# Patient Record
Sex: Male | Born: 1945
Health system: Southern US, Community
[De-identification: ages and names within clinical notes are randomized; demographics above are authoritative.]

## PROBLEM LIST (undated history)

## (undated) DIAGNOSIS — R911 Solitary pulmonary nodule: Secondary | ICD-10-CM

## (undated) DIAGNOSIS — M1990 Unspecified osteoarthritis, unspecified site: Secondary | ICD-10-CM

## (undated) DIAGNOSIS — Z973 Presence of spectacles and contact lenses: Secondary | ICD-10-CM

## (undated) DIAGNOSIS — Z972 Presence of dental prosthetic device (complete) (partial): Secondary | ICD-10-CM

## (undated) DIAGNOSIS — Z87442 Personal history of urinary calculi: Secondary | ICD-10-CM

## (undated) DIAGNOSIS — Z85850 Personal history of malignant neoplasm of thyroid: Secondary | ICD-10-CM

## (undated) DIAGNOSIS — Z889 Allergy status to unspecified drugs, medicaments and biological substances status: Secondary | ICD-10-CM

## (undated) DIAGNOSIS — N201 Calculus of ureter: Secondary | ICD-10-CM

## (undated) DIAGNOSIS — Z860100 Personal history of colon polyps, unspecified: Secondary | ICD-10-CM

## (undated) DIAGNOSIS — E119 Type 2 diabetes mellitus without complications: Secondary | ICD-10-CM

## (undated) DIAGNOSIS — I2510 Atherosclerotic heart disease of native coronary artery without angina pectoris: Secondary | ICD-10-CM

## (undated) DIAGNOSIS — E079 Disorder of thyroid, unspecified: Secondary | ICD-10-CM

## (undated) DIAGNOSIS — E785 Hyperlipidemia, unspecified: Secondary | ICD-10-CM

## (undated) DIAGNOSIS — I10 Essential (primary) hypertension: Secondary | ICD-10-CM

## (undated) DIAGNOSIS — E782 Mixed hyperlipidemia: Secondary | ICD-10-CM

## (undated) HISTORY — DX: Disorder of thyroid, unspecified: E07.9

## (undated) HISTORY — DX: Allergy status to unspecified drugs, medicaments and biological substances status: Z88.9

## (undated) HISTORY — DX: Personal history of colon polyps, unspecified: Z86.0100

## (undated) HISTORY — DX: Personal history of malignant neoplasm of thyroid: Z85.850

## (undated) HISTORY — DX: Personal history of colonic polyps: Z86.010

## (undated) HISTORY — PX: TONSILLECTOMY: SHX5217

## (undated) HISTORY — PX: TONSILLECTOMY: SUR1361

## (undated) HISTORY — DX: Type 2 diabetes mellitus without complications: E11.9

## (undated) HISTORY — DX: Hyperlipidemia, unspecified: E78.5

## (undated) HISTORY — DX: Essential (primary) hypertension: I10

---

## 1977-10-30 HISTORY — PX: KNEE ARTHROSCOPY AND ARTHROTOMY: SUR84

## 1977-10-30 HISTORY — PX: KNEE SURGERY: SHX244

## 1998-07-02 ENCOUNTER — Ambulatory Visit (HOSPITAL_COMMUNITY): Admission: RE | Admit: 1998-07-02 | Discharge: 1998-07-02 | Payer: Self-pay | Admitting: Family Medicine

## 1998-07-02 ENCOUNTER — Encounter: Payer: Self-pay | Admitting: Family Medicine

## 1998-07-03 ENCOUNTER — Other Ambulatory Visit: Admission: RE | Admit: 1998-07-03 | Discharge: 1998-07-03 | Payer: Self-pay | Admitting: General Surgery

## 1998-07-26 ENCOUNTER — Ambulatory Visit (HOSPITAL_COMMUNITY): Admission: RE | Admit: 1998-07-26 | Discharge: 1998-07-27 | Payer: Self-pay | Admitting: General Surgery

## 1998-09-10 DIAGNOSIS — C73 Malignant neoplasm of thyroid gland: Secondary | ICD-10-CM | POA: Insufficient documentation

## 1998-09-27 ENCOUNTER — Ambulatory Visit (HOSPITAL_COMMUNITY): Admission: RE | Admit: 1998-09-27 | Discharge: 1998-09-27 | Payer: Self-pay | Admitting: Internal Medicine

## 1998-09-30 ENCOUNTER — Encounter (HOSPITAL_BASED_OUTPATIENT_CLINIC_OR_DEPARTMENT_OTHER): Payer: Self-pay | Admitting: Internal Medicine

## 1998-10-05 ENCOUNTER — Ambulatory Visit (HOSPITAL_COMMUNITY): Admission: RE | Admit: 1998-10-05 | Discharge: 1998-10-05 | Payer: Self-pay | Admitting: Internal Medicine

## 1998-10-08 ENCOUNTER — Encounter (HOSPITAL_BASED_OUTPATIENT_CLINIC_OR_DEPARTMENT_OTHER): Payer: Self-pay | Admitting: Internal Medicine

## 1998-10-08 ENCOUNTER — Ambulatory Visit (HOSPITAL_COMMUNITY): Admission: RE | Admit: 1998-10-08 | Discharge: 1998-10-08 | Payer: Self-pay | Admitting: Internal Medicine

## 1998-10-30 DIAGNOSIS — Z9221 Personal history of antineoplastic chemotherapy: Secondary | ICD-10-CM

## 1998-10-30 DIAGNOSIS — E890 Postprocedural hypothyroidism: Secondary | ICD-10-CM

## 1998-10-30 DIAGNOSIS — Z85850 Personal history of malignant neoplasm of thyroid: Secondary | ICD-10-CM

## 1998-10-30 HISTORY — PX: TOTAL THYROIDECTOMY: SHX2547

## 1998-10-30 HISTORY — DX: Personal history of antineoplastic chemotherapy: Z92.21

## 1998-10-30 HISTORY — DX: Postprocedural hypothyroidism: E89.0

## 1998-10-30 HISTORY — DX: Personal history of malignant neoplasm of thyroid: Z85.850

## 1998-10-30 HISTORY — PX: THYROIDECTOMY: SHX17

## 2006-12-31 ENCOUNTER — Ambulatory Visit: Payer: Self-pay | Admitting: Cardiovascular Disease

## 2007-11-19 ENCOUNTER — Ambulatory Visit: Payer: Self-pay | Admitting: Gastroenterology

## 2007-11-26 ENCOUNTER — Encounter: Payer: Self-pay | Admitting: Gastroenterology

## 2007-11-26 ENCOUNTER — Ambulatory Visit: Payer: Self-pay | Admitting: Gastroenterology

## 2007-11-26 LAB — HM COLONOSCOPY

## 2010-07-15 ENCOUNTER — Encounter: Payer: Self-pay | Admitting: Cardiovascular Disease

## 2011-03-17 NOTE — Procedures (Signed)
[facility]                              EXERCISE TREADMILL   KHING, BELCHER                     MRN:          [id number]  DATE:12/31/2006                            DOB:          [birth year]    PROCEDURE:  Exercise stress test.   INDICATIONS FOR PROCEDURE:  [name] is a 65 year old gentleman with  orthostatic hypotension, dizziness and weakness. An exercise stress test  was performed to determine exercise tolerance and to screen for  obstructive coronary artery disease as an etiology of his symptoms.   FINDINGS:  [name] exercised following on the treadmill per the  modified Bruce protocol. He exercised into stage 4 for a total of 9  minutes and 24 seconds achieving a work load of 10.6 mets. His resting  ECG was normal with a resting heart rate of 78 beats per minute. His  heart rate rose to a maximum of 166 beats per minute which represented  102% of his maximal age predicted heart rate. The test was discontinued  because of achieving maximum age predicted heart rate. The blood  pressure rose from 132/87 at rest to a maximum of 178/71 mmHg.   [name] had no chest pain, arrhythmia or significant ST segment  changes with exercise.   CONCLUSION:  1. Good exercise tolerance.  2. No evidence of myocardial ischemia.  3. Normal blood pressure response to exercise.     Veverly Fells. Excell Seltzer, MD  Electronically Signed    MDC/MedQ  DD: 01/01/2007  DT: 01/02/2007  Job #: [id number]   cc:   Bradley Wong, M.D.

## 2011-03-17 NOTE — Assessment & Plan Note (Signed)
[facility]                            CARDIOLOGY OFFICE NOTE   Bradley Wong, Bradley Wong                     MRN:          [id number]  DATE:12/31/2006                            DOB:          08-Dec-1945    January 01, 2007   CHIEF COMPLAINT:  Dizziness.   HISTORY OF PRESENT ILLNESS:  Bradley Wong is a very nice 65 year old  gentleman who presents for evaluation of dizziness.  Bradley Wong describes two  distinct episodes of lightheadedness in the last two days.  The first  episode occurred two days prior to his office visit after a full day of  working outside.  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  That  morning, Bradley Wong ate a light breakfast and had some Gatorade at lunch.  Other  than that, Bradley Wong did not have much p.o. intake.  Bradley Wong had two cups of coffee  prior to his work as well.  Soon after Bradley Wong finished working, Bradley Wong developed  a lightheadedness and weakness.  Bradley Wong had to sit down as his symptoms  progressed.  After drinking another Gatorade his symptoms abated.  That  evening Bradley Wong felt fatigued as well.   The following day at church Bradley Wong became lightheaded again after standing  up.  Bradley Wong had been sitting for approximately 10 minutes and then, upon  standing, became lightheaded once again.  Bradley Wong did not have syncope and  his symptoms resolved after sitting and resting for a few more minutes.  Bradley Wong has had no further episodes of lightheadedness.  Bradley Wong denies syncope,  chest pain, palpitations, orthopnea, PND, edema or other cardiovascular  complaints.  Bradley Wong has no history of lightheaded episodes in the past.   Of note, Bradley Wong started taking Benicar approximately three weeks  ago for newly diagnosed hypertension.  Bradley Wong has no other recent medication  changes.   CURRENT MEDICATIONS:  1. Benicar 20 mg daily.  2. Aspirin 81 mg daily.  3. Crestor 5 mg daily.  4. Synthroid 137 mcg daily.   ALLERGIES:  PENICILLIN.   PAST MEDICAL HISTORY:  1. Thyroid cancer, status post  thyroidectomy in 2000.  2. Dyslipidemia.  3. Essential hypertension.  4. History of right knee surgery.  5. Seasonal allergies.   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  Bradley Wong is a nonsmoker  and drinks very rare alcohol.  Bradley Wong drinks approximately two cups of  coffee in the morning.  Bradley Wong is not engaged in formal exercise but does  walk regularly and is Bradley Wong is very active.   FAMILY HISTORY:  His mother has carotid artery disease but is now 63  years old and had no early vascular disease.  Father died at age 12 of  throat and bone cancer.  Bradley Wong had two brothers who are deceased from  complications of diabetes and end-stage renal disease.   REVIEW OF SYSTEMS:  There was a complete 12-point review of systems  performed.  The only pertinent positives were urinary hesitancy, sinus  congestion and seasonal allergies.  PHYSICAL EXAMINATION:  GENERAL APPEARANCE:  The patient is alert and  oriented.  Bradley Wong is in no acute distress.  VITAL SIGNS:  Orthostatic vital signs were supine heart rate 68, blood  pressure 156/100.  Sitting heart rate 82, blood pressure 131/90.  Standing heart rate at 2 minutes was 81 and blood pressure was 138/94.  Weight is 201 pounds.  HEENT:  Normal.  NECK:  Normal carotid upstrokes without bruits.  Jugular venous pressure  is normal.  There is no thyromegaly or nodules.  LUNGS:  Clear to auscultation bilaterally.  CARDIOVASCULAR:  The apex is discrete and nondisplaced.  There is no  right ventricular heave or lift.  Heart is regular rate and rhythm  without murmurs or gallops.  BACK:  There is no paraspinal tenderness.  There is no flank tenderness.  ABDOMEN:  Soft, nontender, no abdominal bruits, no organomegaly.  EXTREMITIES:  No clubbing, cyanosis, or edema.  Peripheral pulses are 2+  and equal throughout.  There are no femoral artery bruits.  SKIN:  Warm and dry without rash.   LYMPHATICS:  There is no adenopathy.  NEUROLOGIC:  Cranial nerves II-XII intact.  Strength is 5/5 and equal in  the arms and legs bilaterally.   EKG is normal sinus rhythm and is within normal limits.   Exercise treadmill stress study showed good exercise tolerance.  No  significant EKG changes.  No arrhythmias and no symptoms.  There was a  normal blood pressure response to exercise.   ASSESSMENT:  Bradley Wong is currently stable from a cardiovascular  standpoint.  His cardiac issues are as follows:  1. Symptomatic orthostatic hypotension.  This is clearly the main      problem that prompted today's visit.  I am uncertain of the cause      of Bradley Wong's new symptoms, although I think it is possible that      the combination of a relatively new antihypertensive medication as      well as mild volume depletion may have resulted in his symptoms      over the preceding two days.  I have advised Bradley Wong to try to avoid      caffeine and to push fluids.  If Bradley Wong has further problems I think it      would be reasonable to alter his antihypertensive therapy, but for      now I have recommended continuing on Benicar.  If Bradley Wong has further      problems, we could consider a beta-blocker or another class of      agent.  I think his normal exercise study is reassuring that there      is no significant organic problem.  2. Dyslipidemia.  Bradley Wong remains under the care of Dr. Christell Constant and is      treated with Crestor.  3. Cardiovascular risk.  Overall Bradley Wong's cardiovascular risk      factors are age, hypertension and dyslipidemia.  I think with      control of his risk factors with medical therapy, Bradley Wong should be      expected to do very well for the long term.   FOLLOW UP:  I plan on seeing Bradley Wong back in the clinic in approximately two  weeks or sooner if any new problems arise.  At that time, will recheck a  blood pressure and see if any medication adjustments are needed.    Veverly Fells. Excell Seltzer, MD   Electronically Signed    MDC/MedQ  DD: 01/01/2007  DT: 01/02/2007  Job #: [id number]   cc:   Ernestina Penna, M.D.

## 2012-06-24 ENCOUNTER — Telehealth: Payer: Self-pay | Admitting: Cardiovascular Disease

## 2012-06-24 DIAGNOSIS — I10 Essential (primary) hypertension: Secondary | ICD-10-CM

## 2012-06-24 NOTE — Telephone Encounter (Signed)
Chart reviewed. Would be reasonable to repeat an exercise treadmill study (POET) considering his symptoms. Please put on my schedule.

## 2012-06-24 NOTE — Telephone Encounter (Signed)
Pt had stress test done 2008, wants to know when he needs another one, pls call

## 2012-06-24 NOTE — Telephone Encounter (Signed)
Pt was evaluated by Dr Christell Constant PCP today for physical. Dr Christell Constant asked the pt to contact our office to see if he needs another stress test performed.  Last stress test in 2008.  Pt denies CP.  Pt does have SOB and fatigue. I will forward this message to Dr Excell Seltzer for review.

## 2012-07-03 NOTE — Telephone Encounter (Signed)
Pt scheduled for GXT on 07/31/12. Pt aware.

## 2012-07-30 ENCOUNTER — Encounter: Payer: Self-pay | Admitting: *Deleted

## 2012-07-31 ENCOUNTER — Encounter: Payer: Self-pay | Admitting: Cardiovascular Disease

## 2012-07-31 ENCOUNTER — Ambulatory Visit (INDEPENDENT_AMBULATORY_CARE_PROVIDER_SITE_OTHER): Payer: BC Managed Care – PPO | Admitting: Cardiovascular Disease

## 2012-07-31 DIAGNOSIS — R9439 Abnormal result of other cardiovascular function study: Secondary | ICD-10-CM

## 2012-07-31 DIAGNOSIS — I10 Essential (primary) hypertension: Secondary | ICD-10-CM

## 2012-07-31 NOTE — Addendum Note (Signed)
Addended by: Iona Coach on: 07/31/2012 05:10 PM   Modules accepted: Orders

## 2012-07-31 NOTE — Procedures (Signed)
Exercise Treadmill Test  Pre-Exercise Testing Evaluation Rhythm: normal sinus  Rate: 70   QT:  .39 QTc: .42     Test  Exercise Tolerance Test Ordering MD: Tonny Bollman, MD  Interpreting MD: Tonny Bollman , MD  Unique Test No: 1  Treadmill:  1  Indication for ETT: HTN  Contraindication to ETT: No   Stress Modality: exercise - treadmill  Cardiac Imaging Performed: non   Protocol: standard Bruce - maximal  Max BP:  152/70  Max MPHR (bpm):  157 85% MPR (bpm):  133  MPHR obtained (bpm):  162 % MPHR obtained:  102%  Reached 85% MPHR (min:sec):  5:00 Total Exercise Time (min-sec):  8:07  Workload in METS:  9.9 Borg Scale: 15  Reason ETT Terminated:  dyspnea    ST Segment Analysis At Rest: normal ST segments - no evidence of significant ST depression With Exercise: significant ischemic ST depression  Other Information Arrhythmia:  Yes Angina during ETT:  absent (0) Quality of ETT:  diagnostic  ETT Interpretation:  abnormal - evidence of ST depression consistent with ischemia  Comments: Abnormal but low-risk stress test with positive ST depression, but upsloping ST segments and rapid normalization in recovery. Few PVC's and rare ventricular couplet with exertion. No angina.  Recommendations: Exercise Myoview stress test.

## 2012-07-31 NOTE — Addendum Note (Signed)
Addended by: Iona Coach on: 07/31/2012 05:02 PM   Modules accepted: Orders

## 2012-08-05 ENCOUNTER — Ambulatory Visit (HOSPITAL_COMMUNITY): Payer: BC Managed Care – PPO | Attending: Cardiology | Admitting: Radiology

## 2012-08-05 VITALS — BP 130/83 | Ht 72.0 in | Wt 212.0 lb

## 2012-08-05 DIAGNOSIS — I10 Essential (primary) hypertension: Secondary | ICD-10-CM

## 2012-08-05 DIAGNOSIS — R5381 Other malaise: Secondary | ICD-10-CM | POA: Insufficient documentation

## 2012-08-05 DIAGNOSIS — R002 Palpitations: Secondary | ICD-10-CM | POA: Insufficient documentation

## 2012-08-05 DIAGNOSIS — R9439 Abnormal result of other cardiovascular function study: Secondary | ICD-10-CM

## 2012-08-05 DIAGNOSIS — R0602 Shortness of breath: Secondary | ICD-10-CM | POA: Insufficient documentation

## 2012-08-05 DIAGNOSIS — I4949 Other premature depolarization: Secondary | ICD-10-CM

## 2012-08-05 MED ORDER — TECHNETIUM TC 99M SESTAMIBI GENERIC - CARDIOLITE
30.0000 | Freq: Once | INTRAVENOUS | Status: AC | PRN
  Administered 2012-08-05: 30 via INTRAVENOUS

## 2012-08-05 MED ORDER — TECHNETIUM TC 99M SESTAMIBI GENERIC - CARDIOLITE
10.0000 | Freq: Once | INTRAVENOUS | Status: AC | PRN
  Administered 2012-08-05: 10 via INTRAVENOUS

## 2012-08-05 NOTE — Progress Notes (Signed)
[facility] SITE 3 NUCLEAR MED [id number] [place] [address] [phone]  Cardiology Nuclear Med Study  Bradley Wong is a 66 y.o. male     MRN : [id number]     DOB: 1946-02-08  Procedure Date: 08/05/2012  Nuclear Med Background Indication for Stress Test:  Evaluation for Ischemia and Abnormal EKG History:  07/31/12 GXT: ST depression PVCS with exercise Cardiac Risk Factors: NA  Symptoms:  Fatigue, Palpitations and SOB   Nuclear Pre-Procedure Caffeine/Decaff Intake:  None NPO After: 7:00am   Lungs:  clear O2 Sat: 98% on room air. IV 0.9% NS with Angio Cath:  20g  IV Site: L Antecubital  IV Started by:  Stanton Kidney, EMT-P  Chest Size (in):  42 Cup Size: n/a  Height: 6' (1.829 m)  Weight:  212 lb (96.163 kg)  BMI:  Body mass index is 28.75 kg/(m^2). Tech Comments:  NA    Nuclear Med Study 1 or 2 day study: 1 day  Stress Test Type:  Stress  Reading MD: Willa Rough, MD  Order Authorizing Provider:  M.Cooper MD  Resting Radionuclide: Technetium 23m Sestamibi  Resting Radionuclide Dose: 11.0 mCi   Stress Radionuclide:  Technetium 53m Sestamibi  Stress Radionuclide Dose: 33.0 mCi           Stress Protocol Rest HR: 63 Stress HR: 155  Rest BP: 130/83 Stress BP: 167/70  Exercise Time (min): 7:30 METS: 9.20   Predicted Max HR: 157 bpm % Max HR: 98.73 bpm Rate Pressure Product: 62952   Dose of Adenosine (mg):  n/a Dose of Lexiscan: n/a mg  Dose of Atropine (mg): n/a Dose of Dobutamine: n/a mcg/kg/min (at max HR)  Stress Test Technologist: Milana Na, EMT-P  Nuclear Technologist:  Domenic Polite, CNMT     Rest Procedure:  Myocardial perfusion imaging was performed at rest 45 minutes following the intravenous administration of Technetium 27m Sestamibi. Rest ECG: NSR - Normal EKG  Stress Procedure:  The patient performed treadmill exercise using a Bruce  Protocol for 7:30 minutes. The patient stopped due to sob, fatigue, and  denied any chest pain.  There were no significant ST-T wave changes and occ pvcs/pacs.  Technetium 27m Sestamibi was injected at peak exercise and myocardial perfusion imaging was performed after a brief delay. Stress ECG: No significant change from baseline ECG  QPS Raw Data Images:  Patient motion noted; appropriate software correction applied. Stress Images:  Normal homogeneous uptake in all areas of the myocardium. Rest Images:  Normal homogeneous uptake in all areas of the myocardium. Subtraction (SDS):  No evidence of ischemia. Transient Ischemic Dilatation (Normal <1.22):  0.91 Lung/Heart Ratio (Normal <0.45):  0.37  Quantitative Gated Spect Images QGS EDV:  78 ml QGS ESV:  24 ml  Impression Exercise Capacity:  Fair exercise capacity. BP Response:  Normal blood pressure response. Clinical Symptoms:  mild shortness of breath ECG Impression:  No significant ST segment change suggestive of ischemia. Comparison with Prior Nuclear Study: No images to compare  Overall Impression:  Normal stress nuclear study.  LV Ejection Fraction: 69%.  LV Wall Motion:  Normal Wall Motion.  Willa Rough, MD

## 2012-10-09 ENCOUNTER — Other Ambulatory Visit: Payer: Self-pay | Admitting: Family Medicine

## 2012-10-09 DIAGNOSIS — M549 Dorsalgia, unspecified: Secondary | ICD-10-CM

## 2012-10-14 ENCOUNTER — Ambulatory Visit
Admission: RE | Admit: 2012-10-14 | Discharge: 2012-10-14 | Disposition: A | Discharge status: Home / Self Care | Payer: BC Managed Care – PPO | Source: Ambulatory Visit | Attending: Family Medicine | Admitting: Family Medicine

## 2012-10-14 ENCOUNTER — Other Ambulatory Visit: Payer: Self-pay | Admitting: Family Medicine

## 2012-10-14 DIAGNOSIS — Z77018 Contact with and (suspected) exposure to other hazardous metals: Secondary | ICD-10-CM

## 2012-10-14 DIAGNOSIS — M549 Dorsalgia, unspecified: Secondary | ICD-10-CM

## 2012-11-27 ENCOUNTER — Encounter: Payer: Self-pay | Admitting: Gastroenterology

## 2012-12-09 LAB — LIPID PANEL
LDL Cholesterol: 41 mg/dL
Triglycerides: 75 mg/dL (ref 40–160)

## 2012-12-09 LAB — BASIC METABOLIC PANEL
BUN: 18 mg/dL (ref 4–21)
Sodium: 142 mmol/L (ref 137–147)

## 2012-12-09 LAB — CBC AND DIFFERENTIAL
HCT: 43 % (ref 41–53)
Hemoglobin: 14.1 g/dL (ref 13.5–17.5)

## 2012-12-09 LAB — HEPATIC FUNCTION PANEL: Bilirubin, Total: 0.7 mg/dL

## 2012-12-17 LAB — FECAL OCCULT BLOOD, GUAIAC: Fecal Occult Blood: NEGATIVE

## 2013-01-06 ENCOUNTER — Encounter: Payer: Self-pay | Admitting: *Deleted

## 2013-01-06 DIAGNOSIS — E785 Hyperlipidemia, unspecified: Secondary | ICD-10-CM | POA: Insufficient documentation

## 2013-01-06 DIAGNOSIS — I10 Essential (primary) hypertension: Secondary | ICD-10-CM

## 2013-01-16 ENCOUNTER — Telehealth: Payer: Self-pay | Admitting: Family Medicine

## 2013-01-16 NOTE — Telephone Encounter (Signed)
Do you have papers for him to pick up?

## 2013-01-17 ENCOUNTER — Ambulatory Visit (INDEPENDENT_AMBULATORY_CARE_PROVIDER_SITE_OTHER): Payer: BC Managed Care – PPO | Admitting: Urology

## 2013-01-17 DIAGNOSIS — N401 Enlarged prostate with lower urinary tract symptoms: Secondary | ICD-10-CM

## 2013-01-17 DIAGNOSIS — R972 Elevated prostate specific antigen [PSA]: Secondary | ICD-10-CM

## 2013-01-17 NOTE — Telephone Encounter (Signed)
Spoke with pt. Dr. Annabell Howells received paperwork pt requesting

## 2013-01-22 ENCOUNTER — Encounter: Payer: Self-pay | Admitting: Family Medicine

## 2013-01-22 ENCOUNTER — Ambulatory Visit (INDEPENDENT_AMBULATORY_CARE_PROVIDER_SITE_OTHER): Payer: BC Managed Care – PPO | Admitting: Family Medicine

## 2013-01-22 VITALS — BP 106/68 | HR 71 | Temp 96.6°F | Ht 72.0 in | Wt 216.0 lb

## 2013-01-22 DIAGNOSIS — R972 Elevated prostate specific antigen [PSA]: Secondary | ICD-10-CM

## 2013-01-22 DIAGNOSIS — I10 Essential (primary) hypertension: Secondary | ICD-10-CM

## 2013-01-22 DIAGNOSIS — E8881 Metabolic syndrome: Secondary | ICD-10-CM

## 2013-01-22 DIAGNOSIS — E559 Vitamin D deficiency, unspecified: Secondary | ICD-10-CM

## 2013-01-22 DIAGNOSIS — E785 Hyperlipidemia, unspecified: Secondary | ICD-10-CM

## 2013-01-22 NOTE — Progress Notes (Signed)
  Subjective:    Patient ID: Bradley Wong, male    DOB: Jul 09, 1946, 67 y.o.   MRN: [id number]  HPI  This patient presents for recheck of multiple medical problems. .  Patient Active Problem List  Diagnosis  . Essential hypertension, benign  . 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 Other and unspecified hyperlipidemia      The allergies, current medications, past medical history, surgical history, family and social history are reviewed.  Immunizations reviewed.  Health maintenance reviewed.  The following items are outstanding:Colonoscopy( with no appt date) and prostate bx by Dr Annabell Howells scheduled fo 02-12-2013       Review of Systems  HENT: Positive for postnasal drip.   Eyes: Positive for pain (L due to straining, at the end of the day).  Respiratory: Positive for shortness of breath (comes and goes).   Cardiovascular: Positive for leg swelling (feet).  Gastrointestinal: Negative.   Genitourinary: Positive for frequency (at night).  Musculoskeletal: Positive for back pain (LBP) and arthralgias (knees,hips).  Neurological: Negative.   Psychiatric/Behavioral: Positive for sleep disturbance (2-3 times weekly).   The positives in the review of systems has not changed significantly    Objective:   Physical Exam  BP 106/68  Pulse 71  Temp(Src) 96.6 F (35.9 C) (Oral)  Ht 6' (1.829 m)  Wt 216 lb (97.977 kg)  BMI 29.29 kg/m2  The patient appeared well nourished and normally developed, alert and oriented to time and place. Speech, behavior and judgement appear normal. Vital signs as documented.  Head exam is unremarkable. No scleral icterus or pallor noted.  Neck is without jugular venous distension, thyromegally, or carotid bruits. Carotid upstrokes are brisk bilaterally. No cervical adenopathy.Rhinitis bilaterally. Lungs are clear anteriorly and posteriorly to auscultation. Normal respiratory effort. Cardiac exam  reveals regular rate and rhythm. First and second heart sounds normal. No murmurs, rubs or gallops.  Abdominal exam reveals normal bowl sounds, no masses, no organomegaly and no aortic enlargement. No inguinal adenopathy. Extremities are nonedematous and both femoral and pedal pulses are normal. Skin without pallor or jaundice.  Warm and very dry, without rash. Neurologic exam reveals normal deep tendon reflexes and normal sensation.         Assessment & Plan:  1. Essential hypertension, benign   2. Other and unspecified hyperlipidemia   3. Metabolic syndrome   4. Elevated PSA

## 2013-01-22 NOTE — Addendum Note (Signed)
Addended by: Bearl Mulberry on: 01/22/2013 01:12 PM   Modules accepted: Orders

## 2013-01-22 NOTE — Patient Instructions (Addendum)
Continue current meds and therapeutic lifestyle changes Make sure you get colonoscopy scheduled Must lose weight Recheck fasting labs in 3 months

## 2013-01-28 HISTORY — PX: PROSTATE BIOPSY: SHX241

## 2013-03-05 ENCOUNTER — Telehealth: Payer: Self-pay | Admitting: Family Medicine

## 2013-03-06 NOTE — Telephone Encounter (Signed)
I'm not sure which specific labs he needs faxed.   I left a message on the patient's voicemail to return my call with this information.

## 2013-03-07 ENCOUNTER — Encounter: Payer: Self-pay | Admitting: Gastroenterology

## 2013-03-15 NOTE — Telephone Encounter (Signed)
Please send copy of last labs to [facility] at fax number given by patient.  Call him to let him know this has been taken care of. Thanks.

## 2013-04-10 ENCOUNTER — Encounter: Payer: Self-pay | Admitting: *Deleted

## 2013-04-11 ENCOUNTER — Ambulatory Visit (AMBULATORY_SURGERY_CENTER): Payer: BC Managed Care – PPO | Admitting: *Deleted

## 2013-04-11 VITALS — Ht 72.0 in | Wt 212.0 lb

## 2013-04-11 DIAGNOSIS — Z1211 Encounter for screening for malignant neoplasm of colon: Secondary | ICD-10-CM

## 2013-04-11 MED ORDER — NA SULFATE-K SULFATE-MG SULF 17.5-3.13-1.6 GM/177ML PO SOLN: ORAL | Status: DC

## 2013-04-14 ENCOUNTER — Encounter: Payer: Self-pay | Admitting: Gastroenterology

## 2013-04-25 ENCOUNTER — Encounter: Payer: Self-pay | Admitting: Gastroenterology

## 2013-04-25 ENCOUNTER — Ambulatory Visit (AMBULATORY_SURGERY_CENTER): Payer: BC Managed Care – PPO | Admitting: Gastroenterology

## 2013-04-25 VITALS — BP 99/65 | HR 69 | Temp 96.4°F | Resp 18 | Ht 72.0 in | Wt 212.0 lb

## 2013-04-25 DIAGNOSIS — Z86010 Personal history of colonic polyps: Secondary | ICD-10-CM

## 2013-04-25 DIAGNOSIS — Z1211 Encounter for screening for malignant neoplasm of colon: Secondary | ICD-10-CM

## 2013-04-25 MED ORDER — SODIUM CHLORIDE 0.9 % IV SOLN: 500.0000 mL | INTRAVENOUS | Status: DC

## 2013-04-25 NOTE — Op Note (Signed)
[facility] [address] [place], [address]   COLONOSCOPY PROCEDURE REPORT  PATIENT: Bradley, Wong  MR#: [id number] BIRTHDATE: 10/28/1946 , 66  yrs. old GENDER: Male ENDOSCOPIST: Louis Meckel, MD REFERRED XB:JYNWGN Christell Constant, M.D. PROCEDURE DATE:  04/25/2013 PROCEDURE:   Colonoscopy, diagnostic ASA CLASS:   Class II INDICATIONS:Patient's personal history of colon polyps. polyps 2003 and 2009 MEDICATIONS: MAC sedation, administered by CRNA and Propofol (Diprivan) 170 mg IV  DESCRIPTION OF PROCEDURE:   After the risks benefits and alternatives of the procedure were thoroughly explained, informed consent was obtained.  A digital rectal exam revealed no abnormalities of the rectum.   The LB [id number]  endoscope was introduced through the anus and advanced to the cecum, which was identified by both the appendix and ileocecal valve. No adverse events experienced.   The quality of the prep was excellent using Suprep  The instrument was then slowly withdrawn as the colon was fully examined.      COLON FINDINGS: A normal appearing cecum, ileocecal valve, and appendiceal orifice were identified.  The ascending, hepatic flexure, transverse, splenic flexure, descending, sigmoid colon and rectum appeared unremarkable.  No polyps or cancers were seen. Retroflexed views revealed no abnormalities. The time to cecum=3 minutes 05 seconds.  Withdrawal time=6 minutes 04 seconds.  The scope was withdrawn and the procedure completed. COMPLICATIONS: There were no complications.  ENDOSCOPIC IMPRESSION: Normal colon  RECOMMENDATIONS: Colonoscopy 10 years   eSigned:  Louis Meckel, MD 04/25/2013 10:09 AM   cc:

## 2013-04-25 NOTE — Patient Instructions (Addendum)
Discharge instructions given with verbal understanding. Normal exam. Resume previous medications. YOU HAD AN ENDOSCOPIC PROCEDURE TODAY AT THE [facility]: Refer to the procedure report that was given to you for any specific questions about what was found during the examination.  If the procedure report does not answer your questions, please call your gastroenterologist to clarify.  If you requested that your care partner not be given the details of your procedure findings, then the procedure report has been included in a sealed envelope for you to review at your convenience later.  YOU SHOULD EXPECT: Some feelings of bloating in the abdomen. Passage of more gas than usual.  Walking can help get rid of the air that was put into your GI tract during the procedure and reduce the bloating. If you had a lower endoscopy (such as a colonoscopy or flexible sigmoidoscopy) you may notice spotting of blood in your stool or on the toilet paper. If you underwent a bowel prep for your procedure, then you may not have a normal bowel movement for a few days.  DIET: Your first meal following the procedure should be a light meal and then it is ok to progress to your normal diet.  A half-sandwich or bowl of soup is an example of a good first meal.  Heavy or fried foods are harder to digest and may make you feel nauseous or bloated.  Likewise meals heavy in dairy and vegetables can cause extra gas to form and this can also increase the bloating.  Drink plenty of fluids but you should avoid alcoholic beverages for 24 hours.  ACTIVITY: Your care partner should take you home directly after the procedure.  You should plan to take it easy, moving slowly for the rest of the day.  You can resume normal activity the day after the procedure however you should NOT DRIVE or use heavy machinery for 24 hours (because of the sedation medicines used during the test).    SYMPTOMS TO REPORT IMMEDIATELY: A gastroenterologist  can be reached at any hour.  During normal business hours, 8:30 AM to 5:00 PM Monday through Friday, call [phone].  After hours and on weekends, please call the GI answering service at [phone] who will take a message and have the physician on call contact you.   Following lower endoscopy (colonoscopy or flexible sigmoidoscopy):  Excessive amounts of blood in the stool  Significant tenderness or worsening of abdominal pains  Swelling of the abdomen that is new, acute  Fever of 100F or higher  FOLLOW UP: If any biopsies were taken you will be contacted by phone or by letter within the next 1-3 weeks.  Call your gastroenterologist if you have not heard about the biopsies in 3 weeks.  Our staff will call the home number listed on your records the next business day following your procedure to check on you and address any questions or concerns that you may have at that time regarding the information given to you following your procedure. This is a courtesy call and so if there is no answer at the home number and we have not heard from you through the emergency physician on call, we will assume that you have returned to your regular daily activities without incident.  SIGNATURES/CONFIDENTIALITY: You and/or your care partner have signed paperwork which will be entered into your electronic medical record.  These signatures attest to the fact that that the information above on your After Visit Summary has been reviewed   and is understood.  Full responsibility of the confidentiality of this discharge information lies with you and/or your care-partner. 

## 2013-04-25 NOTE — Progress Notes (Signed)
Procedure ends, to recovery, report given to Celia, RN and VSS 

## 2013-04-25 NOTE — Progress Notes (Signed)
Patient did not experience any of the following events: a burn prior to discharge; a fall within the facility; wrong site/side/patient/procedure/implant event; or a hospital transfer or hospital admission upon discharge from the facility. (G8907) Patient did not have preoperative order for IV antibiotic SSI prophylaxis. (G8918)  

## 2013-04-28 ENCOUNTER — Telehealth: Payer: Self-pay

## 2013-04-28 NOTE — Telephone Encounter (Signed)
  Follow up Call-  Call back number 04/25/2013  Post procedure Call Back phone  # [phone]  Permission to leave phone message Yes     Patient questions:  Do you have a fever, pain , or abdominal swelling? no Pain Score  0 *  Have you tolerated food without any problems? yes  Have you been able to return to your normal activities? yes  Do you have any questions about your discharge instructions: Diet   no Medications  no Follow up visit  no  Do you have questions or concerns about your Care? no  Actions: * If pain score is 4 or above: No action needed, pain <4.

## 2013-04-30 ENCOUNTER — Other Ambulatory Visit (INDEPENDENT_AMBULATORY_CARE_PROVIDER_SITE_OTHER): Payer: BC Managed Care – PPO

## 2013-04-30 DIAGNOSIS — E8881 Metabolic syndrome: Secondary | ICD-10-CM

## 2013-04-30 DIAGNOSIS — E785 Hyperlipidemia, unspecified: Secondary | ICD-10-CM

## 2013-04-30 DIAGNOSIS — R972 Elevated prostate specific antigen [PSA]: Secondary | ICD-10-CM

## 2013-04-30 DIAGNOSIS — I10 Essential (primary) hypertension: Secondary | ICD-10-CM

## 2013-04-30 DIAGNOSIS — E039 Hypothyroidism, unspecified: Secondary | ICD-10-CM

## 2013-04-30 DIAGNOSIS — E559 Vitamin D deficiency, unspecified: Secondary | ICD-10-CM

## 2013-04-30 LAB — POCT CBC
HCT, POC: 42.4 % — AB (ref 43.5–53.7)
Hemoglobin: 14.9 g/dL (ref 14.1–18.1)
MCH, POC: 34 pg — AB (ref 27–31.2)
MCHC: 35.1 g/dL (ref 31.8–35.4)
MPV: 7.8 fL (ref 0–99.8)
POC LYMPH PERCENT: 31.8 %L (ref 10–50)
RBC: 4.4 M/uL — AB (ref 4.69–6.13)

## 2013-04-30 LAB — BASIC METABOLIC PANEL WITH GFR
BUN: 27 mg/dL — ABNORMAL HIGH (ref 6–23)
Calcium: 8.9 mg/dL (ref 8.4–10.5)
Creat: 1.07 mg/dL (ref 0.50–1.35)
GFR, Est Non African American: 73 mL/min

## 2013-04-30 LAB — HEPATIC FUNCTION PANEL
ALT: 28 U/L (ref 0–53)
AST: 18 U/L (ref 0–37)
Albumin: 3.9 g/dL (ref 3.5–5.2)
Alkaline Phosphatase: 44 U/L (ref 39–117)
Indirect Bilirubin: 0.5 mg/dL (ref 0.0–0.9)
Total Protein: 5.8 g/dL — ABNORMAL LOW (ref 6.0–8.3)

## 2013-04-30 LAB — POCT GLYCOSYLATED HEMOGLOBIN (HGB A1C): Hemoglobin A1C: 5.9

## 2013-04-30 LAB — TSH: TSH: 0.541 u[IU]/mL (ref 0.350–4.500)

## 2013-04-30 NOTE — Progress Notes (Signed)
Patient came in for labs only with orders from [facility]

## 2013-05-01 LAB — THYROGLOBULIN LEVEL: Thyroglobulin: <0.2 ng/mL (ref 0.0–55.0)

## 2013-05-01 LAB — NMR LIPOPROFILE WITH LIPIDS
Cholesterol, Total: 94 mg/dL (ref <200)
HDL Particle Number: 28.9 umol/L — ABNORMAL LOW (ref >=30.5)
LDL (calc): 47 mg/dL (ref <100)
LDL Size: 20 nm — ABNORMAL LOW (ref >20.5)
Large HDL-P: 3 umol/L — ABNORMAL LOW (ref >=4.8)
Large VLDL-P: 1.1 nmol/L (ref <=2.7)
Small LDL Particle Number: 550 nmol/L — ABNORMAL HIGH (ref <=527)

## 2013-05-01 LAB — VITAMIN D 25 HYDROXY (VIT D DEFICIENCY, FRACTURES): Vit D, 25-Hydroxy: 59 ng/mL (ref 30–89)

## 2013-05-07 ENCOUNTER — Ambulatory Visit: Payer: BC Managed Care – PPO | Admitting: Family Medicine

## 2013-05-14 ENCOUNTER — Ambulatory Visit (INDEPENDENT_AMBULATORY_CARE_PROVIDER_SITE_OTHER): Payer: BC Managed Care – PPO | Admitting: Family Medicine

## 2013-05-14 ENCOUNTER — Encounter: Payer: Self-pay | Admitting: Family Medicine

## 2013-05-14 VITALS — BP 94/64 | HR 77 | Temp 97.0°F | Ht 72.0 in | Wt 211.6 lb

## 2013-05-14 DIAGNOSIS — E039 Hypothyroidism, unspecified: Secondary | ICD-10-CM

## 2013-05-14 DIAGNOSIS — I10 Essential (primary) hypertension: Secondary | ICD-10-CM

## 2013-05-14 DIAGNOSIS — G8929 Other chronic pain: Secondary | ICD-10-CM

## 2013-05-14 DIAGNOSIS — E785 Hyperlipidemia, unspecified: Secondary | ICD-10-CM

## 2013-05-14 DIAGNOSIS — M545 Low back pain: Secondary | ICD-10-CM

## 2013-05-14 DIAGNOSIS — E559 Vitamin D deficiency, unspecified: Secondary | ICD-10-CM

## 2013-05-14 NOTE — Progress Notes (Signed)
  Subjective:    Patient ID: Bradley Wong, male    DOB: Jan 08, 1946, 67 y.o.   MRN: [id number]  HPI She comes in today for routine followup of chronic medical problems. These include hyperlipidemia hypertension and hypothyroidism secondary to thyroidectomy, and vitamin D deficient. Recent labs were reviewed with patient today   Review of Systems  Constitutional: Positive for fatigue.  HENT: Positive for postnasal drip and sinus pressure. Negative for ear pain and sore throat.   Eyes: Negative.   Respiratory: Positive for shortness of breath. Negative for cough and wheezing.   Cardiovascular: Negative.   Musculoskeletal: Positive for back pain (LBP) and arthralgias (bilateral knees, R hip > L).  Skin: Negative.   Allergic/Immunologic: Negative.   Neurological: Negative.   Psychiatric/Behavioral: Positive for sleep disturbance (2-3 x week).       Objective:   Physical Exam BP 94/64  Pulse 77  Temp(Src) 97 F (36.1 C) (Oral)  Ht 6' (1.829 m)  Wt 211 lb 9.6 oz (95.981 kg)  BMI 28.69 kg/m2  The patient appeared well nourished and normally developed, alert and oriented to time and place. Speech, behavior and judgement appear normal. Vital signs as documented.  Head exam is unremarkable. No scleral icterus or pallor noted. There is some nasal congestion bilaterally appear  Neck is without jugular venous distension, thyromegally, or carotid bruits. Carotid upstrokes are brisk bilaterally. No cervical adenopathy. Lungs are clear anteriorly and posteriorly to auscultation. Normal respiratory effort. Cardiac exam reveals regular rate and rhythm at 72 per minute. First and second heart sounds normal.  No murmurs, rubs or gallops.  Abdominal exam reveals mild obesity with normal bowl sounds, no masses, no organomegaly and no aortic enlargement. No inguinal adenopathy. Extremities are nonedematous and both femoral and pedal pulses are normal. Skin without pallor or jaundice.  Warm and  dry, without rash. Neurologic exam reveals normal deep tendon reflexes and normal sensation.           Assessment & Plan:  1. Hypertension -Good control  2. Hyperlipemia -Good control  3. Hypothyroid -Followed by Dr. Romeo Apple at [facility]  4. Vitamin D deficiency   5. Chronic low back pain -Will reschedule appointment with Dr. Jeral Fruit in [facility]  Patient Instructions  Fall precautions discussed Continue current meds and therapeutic lifestyle changes We will work on the appointment with the orthopedic doctor   Nyra Capes MD

## 2013-05-14 NOTE — Patient Instructions (Addendum)
Fall precautions discussed Continue current meds and therapeutic lifestyle changes We will work on the appointment with the orthopedic doctor

## 2013-05-15 NOTE — Addendum Note (Signed)
Addended by: Bearl Mulberry on: 05/15/2013 06:02 PM   Modules accepted: Orders

## 2013-05-16 ENCOUNTER — Ambulatory Visit (INDEPENDENT_AMBULATORY_CARE_PROVIDER_SITE_OTHER): Payer: BC Managed Care – PPO | Admitting: Urology

## 2013-05-16 DIAGNOSIS — R972 Elevated prostate specific antigen [PSA]: Secondary | ICD-10-CM

## 2013-05-16 DIAGNOSIS — D400 Neoplasm of uncertain behavior of prostate: Secondary | ICD-10-CM

## 2013-06-05 ENCOUNTER — Other Ambulatory Visit: Payer: Self-pay | Admitting: Neurosurgery

## 2013-06-05 DIAGNOSIS — M5136 Other intervertebral disc degeneration, lumbar region: Secondary | ICD-10-CM

## 2013-06-09 ENCOUNTER — Ambulatory Visit
Admission: RE | Admit: 2013-06-09 | Discharge: 2013-06-09 | Disposition: A | Discharge status: Home / Self Care | Payer: BC Managed Care – PPO | Source: Ambulatory Visit | Attending: Neurosurgery | Admitting: Neurosurgery

## 2013-06-09 VITALS — BP 99/57 | HR 60

## 2013-06-09 DIAGNOSIS — M5136 Other intervertebral disc degeneration, lumbar region: Secondary | ICD-10-CM

## 2013-06-09 MED ORDER — IOHEXOL 180 MG/ML  SOLN
18.0000 mL | Freq: Once | INTRAMUSCULAR | Status: AC | PRN
  Administered 2013-06-09: 18 mL via INTRATHECAL

## 2013-06-09 MED ORDER — DIAZEPAM 5 MG PO TABS
10.0000 mg | ORAL_TABLET | Freq: Once | ORAL | Status: AC
  Administered 2013-06-09: 10 mg via ORAL

## 2013-08-05 ENCOUNTER — Other Ambulatory Visit: Payer: Self-pay

## 2013-08-05 MED ORDER — OLMESARTAN MEDOXOMIL 20 MG PO TABS: 20.0000 mg | ORAL_TABLET | Freq: Every day | ORAL | Status: DC

## 2013-08-06 ENCOUNTER — Other Ambulatory Visit: Payer: Self-pay | Admitting: Family Medicine

## 2013-09-18 ENCOUNTER — Encounter: Payer: Self-pay | Admitting: Family Medicine

## 2013-09-18 ENCOUNTER — Encounter (INDEPENDENT_AMBULATORY_CARE_PROVIDER_SITE_OTHER): Payer: Self-pay

## 2013-09-18 ENCOUNTER — Ambulatory Visit (INDEPENDENT_AMBULATORY_CARE_PROVIDER_SITE_OTHER): Payer: BC Managed Care – PPO | Admitting: Family Medicine

## 2013-09-18 ENCOUNTER — Ambulatory Visit (INDEPENDENT_AMBULATORY_CARE_PROVIDER_SITE_OTHER): Payer: BC Managed Care – PPO

## 2013-09-18 VITALS — BP 102/65 | HR 76 | Temp 96.8°F | Ht 72.0 in | Wt 209.0 lb

## 2013-09-18 DIAGNOSIS — Q619 Cystic kidney disease, unspecified: Secondary | ICD-10-CM

## 2013-09-18 DIAGNOSIS — G8929 Other chronic pain: Secondary | ICD-10-CM

## 2013-09-18 DIAGNOSIS — E785 Hyperlipidemia, unspecified: Secondary | ICD-10-CM

## 2013-09-18 DIAGNOSIS — R7989 Other specified abnormal findings of blood chemistry: Secondary | ICD-10-CM

## 2013-09-18 DIAGNOSIS — R972 Elevated prostate specific antigen [PSA]: Secondary | ICD-10-CM

## 2013-09-18 DIAGNOSIS — I10 Essential (primary) hypertension: Secondary | ICD-10-CM

## 2013-09-18 DIAGNOSIS — M545 Low back pain: Secondary | ICD-10-CM

## 2013-09-18 DIAGNOSIS — E8881 Metabolic syndrome: Secondary | ICD-10-CM

## 2013-09-18 DIAGNOSIS — N281 Cyst of kidney, acquired: Secondary | ICD-10-CM

## 2013-09-18 LAB — POCT CBC
HCT, POC: 46.3 % (ref 43.5–53.7)
Lymph, poc: 1.5 (ref 0.6–3.4)
MCH, POC: 30.9 pg (ref 27–31.2)
MCV: 95.2 fL (ref 80–97)
Platelet Count, POC: 163 10*3/uL (ref 142–424)
RDW, POC: 13.5 %
WBC: 6.2 10*3/uL (ref 4.6–10.2)

## 2013-09-18 NOTE — Patient Instructions (Addendum)
Continue current medications. Continue good therapeutic lifestyle changes which include good diet and exercise. Fall precautions discussed with patient. If you are over 65 years old - you may need Prevnar 13 or the adult Pneumonia vaccine. Patient is limited in his ability to walk for long distances or sit for  extended periods of time due to his degenerative disc disease in his back Continue physical therapy  Drink plenty of fluids this winter and use a cool mist umidifier in her bedroom at nigh

## 2013-09-18 NOTE — Progress Notes (Signed)
Subjective:    Patient ID: Bradley Wong, male    DOB: [birth year], 67 y.o.   MRN: [id number]  HPI Pt here for follow up and management of chronic medical problems. Patient is doing well except for his continued back pain for which he is seeing the neurosurgeon. He is also being followed by Dr. Annabell Howells, the urologists for an elevated PSA. It is of note that he has a cyst on the right kidney found from an MRI done by Dr. Jeral Fruit.     Patient Active Problem List   Diagnosis Date Noted  . Metabolic syndrome 01/22/2013  . Elevated PSA 01/22/2013  . Essential hypertension, benign 01/06/2013  . 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 Hyperlipidemia 01/06/2013   Outpatient Encounter Prescriptions as of 09/18/2013  Medication Sig  . aspirin 81 MG tablet Take 81 mg by mouth daily.  . Cholecalciferol (VITAMIN D-3) 5000 UNITS TABS Take 1 tablet by mouth daily.  Marland Kitchen levothyroxine (SYNTHROID, LEVOTHROID) 112 MCG tablet Take 112 mcg by mouth daily.  Marland Kitchen olmesartan (BENICAR) 20 MG tablet Take 1 tablet (20 mg total) by mouth daily.  . rosuvastatin (CRESTOR) 20 MG tablet Take 20 mg by mouth daily.    Review of Systems  Constitutional: Negative.   HENT: Negative.   Eyes: Negative.   Respiratory: Negative.   Cardiovascular: Negative.   Gastrointestinal: Negative.   Endocrine: Negative.   Genitourinary: Negative.        Dr Jeral Fruit found cyst on right kidney from MRI  Musculoskeletal: Positive for back pain.  Skin: Negative.   Allergic/Immunologic: Negative.   Neurological: Negative.   Hematological: Negative.   Psychiatric/Behavioral: Negative.        Objective:   Physical Exam  Nursing note and vitals reviewed. Constitutional: He is oriented to person, place, and time. He appears well-developed and well-nourished. No distress.  HENT:  Head: Normocephalic and atraumatic.  Right Ear: External ear normal.  Left Ear: External ear  normal.  Mouth/Throat: Oropharynx is clear and moist. No oropharyngeal exudate.  Nasal turbinate congestion bilaterally  Eyes: Conjunctivae and EOM are normal. Pupils are equal, round, and reactive to light. Right eye exhibits no discharge. Left eye exhibits no discharge. No scleral icterus.  Neck: Normal range of motion. Neck supple. No tracheal deviation present. No thyromegaly present.  Specifically no thyroid enlargement no adenopathy or abnormal masses in the anterior cervical chain or the posterior neck  Cardiovascular: Normal rate, regular rhythm, normal heart sounds and intact distal pulses.  Exam reveals no gallop and no friction rub.   No murmur heard. At 72 per minute  Pulmonary/Chest: Effort normal and breath sounds normal. No respiratory distress. He has no wheezes. He has no rales. He exhibits no tenderness.  No axillary adenopathy  Abdominal: Soft. Bowel sounds are normal. He exhibits no mass. There is no tenderness. There is no rebound and no guarding.  No inguinal adenopathy  Musculoskeletal: Normal range of motion. He exhibits no edema and no tenderness.  Somewhat rigid in his back due to his back pain  Lymphadenopathy:    He has no cervical adenopathy.  Neurological: He is alert and oriented to person, place, and time. He has normal reflexes.  Skin: Skin is warm and dry. No rash noted. No erythema. No pallor.  Skin is very dry  Psychiatric: He has a normal mood and affect. His behavior is normal. Judgment and thought content normal.   BP 102/65  Pulse 76  Temp(Src) 96.8 F (36 C) (Oral)  Ht 6' (1.829 m)  Wt 209 lb (94.802 kg)  BMI 28.34 kg/m2        Assessment & Plan:   1. Essential hypertension, benign   2. Elevated PSA   3. Hyperlipidemia   4. Metabolic syndrome   5. Renal cyst, left   6. Chronic low back pain    Orders Placed This Encounter  Procedures  . Hepatic function panel  . BMP8+EGFR  . NMR, lipoprofile  . PSA, total and free  . POCT CBC    No orders of the defined types were placed in this encounter.   Patient Instructions  Continue current medications. Continue good therapeutic lifestyle changes which include good diet and exercise. Fall precautions discussed with patient. If you are over 48 years old - you may need Prevnar 13 or the adult Pneumonia vaccine. Patient is limited in his ability to walk for long distances or sit for  extended periods of time due to his degenerative disc disease in his back Continue physical therapy  Drink plenty of fluids this winter and use a cool mist umidifier in her bedroom at nigh   Nyra Capes MD

## 2013-09-18 NOTE — Addendum Note (Signed)
Addended by: Magdalene River on: 09/18/2013 09:57 AM   Modules accepted: Orders

## 2013-09-19 LAB — HEPATIC FUNCTION PANEL
ALT: 35 IU/L (ref 0–44)
AST: 18 IU/L (ref 0–40)
Alkaline Phosphatase: 58 IU/L (ref 39–117)

## 2013-09-19 LAB — BMP8+EGFR
BUN: 17 mg/dL (ref 8–27)
CO2: 24 mmol/L (ref 18–29)
Calcium: 9.4 mg/dL (ref 8.6–10.2)
Chloride: 105 mmol/L (ref 97–108)
Creatinine, Ser: 1.09 mg/dL (ref 0.76–1.27)
Sodium: 142 mmol/L (ref 134–144)

## 2013-09-19 LAB — NMR, LIPOPROFILE
HDL Cholesterol by NMR: 40 mg/dL (ref >=40)
LDLC SERPL CALC-MCNC: 38 mg/dL (ref <100)
LP-IR Score: 53 — ABNORMAL HIGH (ref <=45)
Small LDL Particle Number: 661 nmol/L — ABNORMAL HIGH (ref <=527)

## 2013-09-19 LAB — PSA, TOTAL AND FREE
PSA, Free: 0.57 ng/mL
PSA: 3.1 ng/mL (ref 0.0–4.0)

## 2013-09-24 ENCOUNTER — Other Ambulatory Visit: Payer: Self-pay | Admitting: Family Medicine

## 2013-09-26 NOTE — Addendum Note (Signed)
Addended by: Prescott Gum on: 09/26/2013 08:41 AM   Modules accepted: Orders

## 2013-10-01 ENCOUNTER — Telehealth: Payer: Self-pay | Admitting: *Deleted

## 2013-10-01 NOTE — Telephone Encounter (Signed)
Lm to call back

## 2013-10-01 NOTE — Telephone Encounter (Signed)
LM TO CALL BACK

## 2013-10-01 NOTE — Telephone Encounter (Signed)
Message copied by Baltazar Apo on Wed Oct 01, 2013 11:58 AM ------      Message from: Ernestina Penna      Created: Fri Sep 26, 2013  9:12 AM       The hemoglobin A1c is 5.7% and this is in the pre-diabetic range which is 5.7-6.4. No treatment is necessary but watch diet closely and followed good exercise plan ------

## 2013-10-01 NOTE — Telephone Encounter (Signed)
Message copied by Baltazar Apo on Wed Oct 01, 2013 11:18 AM ------      Message from: Ernestina Penna      Created: Fri Sep 19, 2013  7:42 PM       LFTs within normal limit      Blood sugar is elevated at 125, kidney function electrolytes and potassium are all within normal limits++++++++LAB------ add a hemoglobin A1c      The total LDL particle number which is the most important cholesterol number on advanced lipid testing is at goal of less than 1000, at 719. The LDL C. is good at 38. Triglycerides are good . LDL size is small.------- continue current treatment and aggressive therapeutic lifestyle changes      The PSA is 3.1 and 4 months ago it was 3.7----make sure that Dr. Annabell Howells gets a copy of this ------

## 2013-10-02 DIAGNOSIS — M5137 Other intervertebral disc degeneration, lumbosacral region: Secondary | ICD-10-CM | POA: Diagnosis not present

## 2013-10-02 DIAGNOSIS — M545 Low back pain, unspecified: Secondary | ICD-10-CM | POA: Diagnosis not present

## 2013-10-17 ENCOUNTER — Other Ambulatory Visit: Payer: Self-pay | Admitting: Urology

## 2013-10-17 ENCOUNTER — Ambulatory Visit (INDEPENDENT_AMBULATORY_CARE_PROVIDER_SITE_OTHER): Payer: Medicare Other | Admitting: Urology

## 2013-10-17 DIAGNOSIS — R972 Elevated prostate specific antigen [PSA]: Secondary | ICD-10-CM

## 2013-10-17 DIAGNOSIS — N401 Enlarged prostate with lower urinary tract symptoms: Secondary | ICD-10-CM

## 2013-10-17 DIAGNOSIS — N281 Cyst of kidney, acquired: Secondary | ICD-10-CM

## 2013-10-27 ENCOUNTER — Ambulatory Visit (HOSPITAL_COMMUNITY): Payer: BC Managed Care – PPO

## 2013-10-30 ENCOUNTER — Other Ambulatory Visit: Payer: Self-pay | Admitting: Family Medicine

## 2013-10-30 DIAGNOSIS — J209 Acute bronchitis, unspecified: Secondary | ICD-10-CM

## 2013-10-30 MED ORDER — AZITHROMYCIN 250 MG PO TABS: ORAL_TABLET | ORAL | Status: DC

## 2013-12-15 ENCOUNTER — Ambulatory Visit (HOSPITAL_COMMUNITY)
Admission: RE | Admit: 2013-12-15 | Discharge: 2013-12-15 | Disposition: A | Discharge status: Home / Self Care | Payer: Medicare Other | Source: Ambulatory Visit | Attending: Urology | Admitting: Urology

## 2013-12-15 DIAGNOSIS — N281 Cyst of kidney, acquired: Secondary | ICD-10-CM | POA: Diagnosis not present

## 2013-12-15 DIAGNOSIS — Q619 Cystic kidney disease, unspecified: Secondary | ICD-10-CM | POA: Diagnosis not present

## 2013-12-15 DIAGNOSIS — Z09 Encounter for follow-up examination after completed treatment for conditions other than malignant neoplasm: Secondary | ICD-10-CM | POA: Insufficient documentation

## 2013-12-15 DIAGNOSIS — N400 Enlarged prostate without lower urinary tract symptoms: Secondary | ICD-10-CM | POA: Diagnosis not present

## 2013-12-31 ENCOUNTER — Ambulatory Visit (INDEPENDENT_AMBULATORY_CARE_PROVIDER_SITE_OTHER): Payer: Medicare Other | Admitting: Family Medicine

## 2013-12-31 ENCOUNTER — Encounter: Payer: Self-pay | Admitting: Family Medicine

## 2013-12-31 ENCOUNTER — Encounter: Payer: Self-pay | Admitting: *Deleted

## 2013-12-31 VITALS — BP 121/79 | HR 77 | Temp 96.9°F | Ht 72.0 in | Wt 215.0 lb

## 2013-12-31 DIAGNOSIS — Z23 Encounter for immunization: Secondary | ICD-10-CM | POA: Diagnosis not present

## 2013-12-31 DIAGNOSIS — Z0000 Encounter for general adult medical examination without abnormal findings: Secondary | ICD-10-CM | POA: Diagnosis not present

## 2013-12-31 DIAGNOSIS — E785 Hyperlipidemia, unspecified: Secondary | ICD-10-CM | POA: Diagnosis not present

## 2013-12-31 DIAGNOSIS — E559 Vitamin D deficiency, unspecified: Secondary | ICD-10-CM | POA: Diagnosis not present

## 2013-12-31 DIAGNOSIS — I10 Essential (primary) hypertension: Secondary | ICD-10-CM

## 2013-12-31 DIAGNOSIS — R972 Elevated prostate specific antigen [PSA]: Secondary | ICD-10-CM

## 2013-12-31 DIAGNOSIS — E8881 Metabolic syndrome: Secondary | ICD-10-CM

## 2013-12-31 LAB — POCT CBC
Granulocyte percent: 70 %G (ref 37–80)
HCT, POC: 44.2 % (ref 43.5–53.7)
Hemoglobin: 14.3 g/dL (ref 14.1–18.1)
LYMPH, POC: 1.5 (ref 0.6–3.4)
MCH: 31.6 pg — AB (ref 27–31.2)
MCHC: 32.3 g/dL (ref 31.8–35.4)
MCV: 97.9 fL — AB (ref 80–97)
MPV: 7.9 fL (ref 0–99.8)
PLATELET COUNT, POC: 156 10*3/uL (ref 142–424)
POC Granulocyte: 4.3 (ref 2–6.9)
POC LYMPH %: 25.4 % (ref 10–50)
RBC: 4.5 M/uL — AB (ref 4.69–6.13)
RDW, POC: 13.9 %
WBC: 6.1 10*3/uL (ref 4.6–10.2)

## 2013-12-31 LAB — POCT GLYCOSYLATED HEMOGLOBIN (HGB A1C): HEMOGLOBIN A1C: 5.8

## 2013-12-31 NOTE — Patient Instructions (Addendum)
Medicare Annual Wellness Visit  [facility] and the medical providers at [facility] strive to bring you the best medical care.  In doing so we not only want to address your current medical conditions and concerns but also to detect new conditions early and prevent illness, disease and health-related problems.    Medicare offers a yearly Wellness Visit which allows our clinical staff to assess your need for preventative services including immunizations, lifestyle education, counseling to decrease risk of preventable diseases and screening for fall risk and other medical concerns.    This visit is provided free of charge (no copay) for all Medicare recipients. The clinical pharmacists at [facility] have begun to conduct these Wellness Visits which will also include a thorough review of all your medications.    As you primary medical provider recommend that you make an appointment for your Annual Wellness Visit if you have not done so already this year.  You may set up this appointment before you leave today or you may call back ([phone]) and schedule an appointment.  Please make sure when you call that you mention that you are scheduling your Annual Wellness Visit with the clinical pharmacist so that the appointment may be made for the proper length of time.     Continue current medications. Continue good therapeutic lifestyle changes which include good diet and exercise. Fall precautions discussed with patient. If an FOBT was given today- please return it to our front desk. If you are over 63 years old - you may need Prevnar 10 or the adult Pneumonia vaccine. Continue physical therapy and followup with neurosurgeon as planned Followup with urologist as planned

## 2013-12-31 NOTE — Progress Notes (Signed)
Subjective:    Patient ID: Bradley Wong, male    DOB: 05/24/1946, 68 y.o.   MRN: [id number]  HPI Patient here today for Welcome to Medicare Well Exam.       Patient Active Problem List   Diagnosis Date Noted  . Metabolic syndrome 04/24/9484  . Elevated PSA 01/22/2013  . Essential hypertension, benign 01/06/2013  . History of thyroid cancer  01/06/2013  . Hyperlipidemia 01/06/2013   Outpatient Encounter Prescriptions as of 12/31/2013  Medication Sig  . aspirin 81 MG tablet Take 81 mg by mouth daily.  . Cholecalciferol (VITAMIN D-3) 5000 UNITS TABS Take 1 tablet by mouth daily.  Marland Kitchen levothyroxine (SYNTHROID, LEVOTHROID) 112 MCG tablet Take 112 mcg by mouth daily.  Marland Kitchen olmesartan (BENICAR) 20 MG tablet Take 1 tablet (20 mg total) by mouth daily.  . rosuvastatin (CRESTOR) 40 MG tablet TAKE ONE TABLET BY MOUTH ONE TIME DAILY as directed  . [DISCONTINUED] CRESTOR 40 MG tablet TAKE ONE TABLET BY MOUTH ONE TIME DAILY  . [DISCONTINUED] azithromycin (ZITHROMAX) 250 MG tablet 2 stat then one daily for 4 days  . [DISCONTINUED] rosuvastatin (CRESTOR) 20 MG tablet Take 20 mg by mouth daily.    Review of Systems  Constitutional: Negative.   HENT: Negative.   Eyes: Negative.   Respiratory: Negative.   Cardiovascular: Negative.   Gastrointestinal: Negative.   Endocrine: Negative.   Genitourinary: Negative.   Musculoskeletal: Positive for arthralgias.  Skin: Negative.   Allergic/Immunologic: Negative.   Neurological: Negative.   Hematological: Negative.   Psychiatric/Behavioral: Negative.        Objective:   Physical Exam BP 121/79  Pulse 77  Temp(Src) 96.9 F (36.1 C) (Oral)  Ht 6' (1.829 m)  Wt 215 lb (97.523 kg)  BMI 29.15 kg/m2        Assessment & Plan:   Subjective:    [name] is a 68 y.o. male who presents for a welcome to Medicare exam.   Cardiac risk factors: advanced age (older than 46 for men, 8 for women), dyslipidemia, hypertension and male  gender.  Depression Screen (Note: if answer to either of the following is "Yes", a more complete depression screening is indicated)  Q1: Over the past two weeks, have you felt down, depressed or hopeless? no Q2: Over the past two weeks, have you felt little interest or pleasure in doing things? no  Activities of Daily Living In your present state of health, do you have any difficulty performing the following activities?:  Preparing food and eating?: No Bathing yourself: No Getting dressed: No Using the toilet:No Moving around from place to place: No In the past year have you fallen or had a near fall?:No  Current exercise habits: The patient does not participate in regular exercise at present.  Dietary issues discussed: good diet and start exercise routine.  Hearing difficulties: little trouble Safe in current home environment: yes  The following portions of the patient's history were reviewed and updated as appropriate: allergies, current medications, past family history, past medical history, past social history, past surgical history and problem list. Review of Systems A comprehensive review of systems was negative.    Objective:     Vision by Snellen chart: right eye:20/15, left eye:20/20  There is no weight on file to calculate BMI. BP 121/79  Pulse 77  Temp(Src) 96.9 F (36.1 C) (Oral)  Ht 6' (1.829 m)  Wt 215 lb (97.523 kg)  BMI 29.15 kg/m2  General Appearance:  Alert, cooperative, no distress, appears stated age  Head:    Normocephalic, without obvious abnormality, atraumatic  Eyes:    PERRL, conjunctiva/corneas clear, EOM's intact, fundi    benign, both eyes       Ears:    Normal TM's and external ear canals, both ears  Nose:   Nares normal, septum midline, mucosal redness and congestion, no drainage    or sinus tenderness  Throat:   Lips, mucosa, and tongue normal; teeth and gums normal  Neck:   Supple, symmetrical, trachea midline, no adenopathy;        thyroid:  No enlargement/tenderness/nodules; no carotid   bruit or JVD  Back:     Symmetric, no curvature, ROM normal, no CVA tenderness  Lungs:     Clear to auscultation bilaterally, respirations unlabored  Chest wall:    No tenderness or deformity  Heart:    Regular rate and rhythm, S1 and S2 normal, no murmur, rub   or gallop.is regular at 70   Abdomen:     Soft, non-tender, bowel sounds active all four quadrants,    no masses, no organomegaly  Genitalia:    this is deferred to the urologist in 2 weeks   Rectal:    this is deferred to the  urologist that she will be seeing later in the month     Extremities:   Extremities normal, atraumatic, no cyanosis or edema  Pulses:   2+ and symmetric all extremities  Skin:   Skin color, texture, turgor normal, no rashes or lesions.skin is very dry over his entire body   Lymph nodes:   Cervical, supraclavicular, and axillary nodes normal  Neurologic:   CNII-XII intact. Normal strength, sensation and reflexes      throughout    EKG: Within normal limits.    Assessment:      Patient Active Problem List   Diagnosis Date Noted  . Metabolic syndrome 54/62/7035  . Elevated PSA 01/22/2013  . Essential hypertension, benign 01/06/2013  . History of thyroid cancer  01/06/2013  . Hyperlipidemia 01/06/2013        Plan:     During the course of the visit the patient was educated and counseled about appropriate screening and preventive services including:   Pneumococcal vaccine   Screening electrocardiogram  Patient Instructions (the written plan) was given to the patient.        Arrie Senate MD

## 2013-12-31 NOTE — Progress Notes (Unsigned)
Patient ID: Bradley Wong, male   DOB: [birth year], 68 y.o.   MRN: [id number] Dr Burt Knack- please review pt's recent EKG's done at [facility] on 12/31/13. Dr Laurance Flatten would like a second opinion and compare these to recent ETT Thank you!

## 2014-01-02 LAB — NMR, LIPOPROFILE
Cholesterol: 98 mg/dL (ref <200)
HDL CHOLESTEROL BY NMR: 43 mg/dL (ref >=40)
HDL Particle Number: 31.7 umol/L (ref >=30.5)
LDL PARTICLE NUMBER: 727 nmol/L (ref <1000)
LDL SIZE: 20.2 nm — AB (ref >20.5)
LDLC SERPL CALC-MCNC: 41 mg/dL (ref <100)
LP-IR Score: 44 (ref <=45)
Small LDL Particle Number: 491 nmol/L (ref <=527)
TRIGLYCERIDES BY NMR: 71 mg/dL (ref <150)

## 2014-01-02 LAB — HEPATIC FUNCTION PANEL
ALT: 30 IU/L (ref 0–44)
AST: 19 IU/L (ref 0–40)
Albumin: 4.6 g/dL (ref 3.6–4.8)
Alkaline Phosphatase: 55 IU/L (ref 39–117)
Bilirubin, Direct: 0.19 mg/dL (ref 0.00–0.40)
Total Bilirubin: 0.6 mg/dL (ref 0.0–1.2)
Total Protein: 6.3 g/dL (ref 6.0–8.5)

## 2014-01-02 LAB — BMP8+EGFR
BUN/Creatinine Ratio: 19 (ref 10–22)
BUN: 19 mg/dL (ref 8–27)
CALCIUM: 9.5 mg/dL (ref 8.6–10.2)
CO2: 20 mmol/L (ref 18–29)
Chloride: 107 mmol/L (ref 97–108)
Creatinine, Ser: 0.98 mg/dL (ref 0.76–1.27)
GFR calc Af Amer: 93 mL/min/{1.73_m2} (ref >59)
GFR calc non Af Amer: 81 mL/min/{1.73_m2} (ref >59)
Glucose: 123 mg/dL — ABNORMAL HIGH (ref 65–99)
POTASSIUM: 5 mmol/L (ref 3.5–5.2)
SODIUM: 145 mmol/L — AB (ref 134–144)

## 2014-01-02 LAB — PSA, TOTAL AND FREE
PSA FREE: 0.61 ng/mL
PSA, Free Pct: 18.5 %
PSA: 3.3 ng/mL (ref 0.0–4.0)

## 2014-01-02 LAB — VITAMIN D 25 HYDROXY (VIT D DEFICIENCY, FRACTURES): Vit D, 25-Hydroxy: 49.7 ng/mL (ref 30.0–100.0)

## 2014-01-23 ENCOUNTER — Ambulatory Visit (INDEPENDENT_AMBULATORY_CARE_PROVIDER_SITE_OTHER): Payer: Medicare Other | Admitting: Urology

## 2014-01-23 DIAGNOSIS — N281 Cyst of kidney, acquired: Secondary | ICD-10-CM

## 2014-01-23 DIAGNOSIS — N401 Enlarged prostate with lower urinary tract symptoms: Secondary | ICD-10-CM

## 2014-01-23 DIAGNOSIS — R972 Elevated prostate specific antigen [PSA]: Secondary | ICD-10-CM | POA: Diagnosis not present

## 2014-01-23 DIAGNOSIS — N138 Other obstructive and reflux uropathy: Secondary | ICD-10-CM | POA: Diagnosis not present

## 2014-01-23 DIAGNOSIS — R300 Dysuria: Secondary | ICD-10-CM | POA: Diagnosis not present

## 2014-02-10 ENCOUNTER — Other Ambulatory Visit: Payer: Medicare Other

## 2014-02-10 DIAGNOSIS — Z1212 Encounter for screening for malignant neoplasm of rectum: Secondary | ICD-10-CM

## 2014-02-10 NOTE — Progress Notes (Signed)
Pt came in for labs only 

## 2014-02-12 LAB — FECAL OCCULT BLOOD, IMMUNOCHEMICAL: FECAL OCCULT BLD: NEGATIVE

## 2014-03-04 ENCOUNTER — Encounter: Payer: Self-pay | Admitting: *Deleted

## 2014-03-30 DIAGNOSIS — Z85850 Personal history of malignant neoplasm of thyroid: Secondary | ICD-10-CM | POA: Diagnosis not present

## 2014-03-30 DIAGNOSIS — E890 Postprocedural hypothyroidism: Secondary | ICD-10-CM | POA: Diagnosis not present

## 2014-03-30 DIAGNOSIS — C73 Malignant neoplasm of thyroid gland: Secondary | ICD-10-CM | POA: Diagnosis not present

## 2014-05-12 ENCOUNTER — Ambulatory Visit (INDEPENDENT_AMBULATORY_CARE_PROVIDER_SITE_OTHER): Payer: Medicare Other | Admitting: Family Medicine

## 2014-05-12 ENCOUNTER — Encounter: Payer: Self-pay | Admitting: Family Medicine

## 2014-05-12 VITALS — BP 117/75 | HR 65 | Temp 96.7°F | Ht 72.0 in | Wt 215.0 lb

## 2014-05-12 DIAGNOSIS — I10 Essential (primary) hypertension: Secondary | ICD-10-CM

## 2014-05-12 DIAGNOSIS — D497 Neoplasm of unspecified behavior of endocrine glands and other parts of nervous system: Secondary | ICD-10-CM

## 2014-05-12 DIAGNOSIS — E785 Hyperlipidemia, unspecified: Secondary | ICD-10-CM

## 2014-05-12 DIAGNOSIS — R972 Elevated prostate specific antigen [PSA]: Secondary | ICD-10-CM

## 2014-05-12 DIAGNOSIS — E8881 Metabolic syndrome: Secondary | ICD-10-CM

## 2014-05-12 DIAGNOSIS — E559 Vitamin D deficiency, unspecified: Secondary | ICD-10-CM

## 2014-05-12 DIAGNOSIS — M4726 Other spondylosis with radiculopathy, lumbar region: Secondary | ICD-10-CM

## 2014-05-12 DIAGNOSIS — M47816 Spondylosis without myelopathy or radiculopathy, lumbar region: Secondary | ICD-10-CM | POA: Insufficient documentation

## 2014-05-12 DIAGNOSIS — M47817 Spondylosis without myelopathy or radiculopathy, lumbosacral region: Secondary | ICD-10-CM

## 2014-05-12 LAB — POCT CBC
GRANULOCYTE PERCENT: 65.6 % (ref 37–80)
HCT, POC: 41.8 % — AB (ref 43.5–53.7)
Hemoglobin: 13.7 g/dL — AB (ref 14.1–18.1)
Lymph, poc: 1.3 (ref 0.6–3.4)
MCH, POC: 31.7 pg — AB (ref 27–31.2)
MCHC: 32.9 g/dL (ref 31.8–35.4)
MCV: 96.5 fL (ref 80–97)
MPV: 7.5 fL (ref 0–99.8)
PLATELET COUNT, POC: 136 10*3/uL — AB (ref 142–424)
POC Granulocyte: 3.1 (ref 2–6.9)
POC LYMPH PERCENT: 26.1 %L (ref 10–50)
RBC: 4.3 M/uL — AB (ref 4.69–6.13)
RDW, POC: 13.6 %
WBC: 4.8 10*3/uL (ref 4.6–10.2)

## 2014-05-12 LAB — POCT GLYCOSYLATED HEMOGLOBIN (HGB A1C): HEMOGLOBIN A1C: 5.9

## 2014-05-12 MED ORDER — HYDROCODONE-ACETAMINOPHEN 10-325 MG PO TABS: 1.0000 | ORAL_TABLET | Freq: Four times a day (QID) | ORAL | Status: DC | PRN

## 2014-05-12 NOTE — Progress Notes (Signed)
Subjective:    Patient ID: Bradley Wong, male    DOB: 1946/04/14, 68 y.o.   MRN: [id number]  HPI Pt here for follow up and management of chronic medical problems. The patient has a concern about a skin lesion which we will look at. He will get lab work today. He is followed by the neurosurgeon for his back pain. He is also followed by the urologist because of an elevated PSA. He would like a refill on his pain medicine for his back which he takes only on an as needed basis.        Patient Active Problem List   Diagnosis Date Noted  . Metabolic syndrome 60/01/5996  . Elevated PSA 01/22/2013  . Essential hypertension, benign 01/06/2013  . History of thyroid cancer  01/06/2013  . Hyperlipidemia 01/06/2013   Outpatient Encounter Prescriptions as of 05/12/2014  Medication Sig  . aspirin 81 MG tablet Take 81 mg by mouth daily.  . Cholecalciferol (VITAMIN D-3) 5000 UNITS TABS Take 1 tablet by mouth daily.  Marland Kitchen HYDROcodone-acetaminophen (NORCO) 10-325 MG per tablet Take 1 tablet by mouth every 4 (four) hours as needed for severe pain.  Marland Kitchen levothyroxine (SYNTHROID, LEVOTHROID) 112 MCG tablet Take 112 mcg by mouth daily.  Marland Kitchen olmesartan (BENICAR) 20 MG tablet Take 1 tablet (20 mg total) by mouth daily.  . rosuvastatin (CRESTOR) 40 MG tablet TAKE ONE TABLET BY MOUTH ONE TIME DAILY as directed    Review of Systems  Constitutional: Negative.   HENT: Negative.   Eyes: Negative.   Respiratory: Negative.   Cardiovascular: Negative.   Gastrointestinal: Negative.   Endocrine: Negative.   Genitourinary: Negative.   Musculoskeletal: Negative.   Skin: Negative.        New mole on right upper thigh   Allergic/Immunologic: Negative.   Neurological: Negative.   Hematological: Negative.   Psychiatric/Behavioral: Negative.        Objective:   Physical Exam  Nursing note and vitals reviewed. Constitutional: He is oriented to person, place, and time. He appears well-developed and  well-nourished. No distress.  HENT:  Head: Normocephalic and atraumatic.  Right Ear: External ear normal.  Left Ear: External ear normal.  Nose: Nose normal.  Mouth/Throat: Oropharynx is clear and moist. No oropharyngeal exudate.  Eyes: Conjunctivae and EOM are normal. Pupils are equal, round, and reactive to light. Right eye exhibits no discharge. Left eye exhibits no discharge. No scleral icterus.  Neck: Normal range of motion. Neck supple. No thyromegaly present.  No thyroid masses or adenopathy  Cardiovascular: Normal rate, regular rhythm, normal heart sounds and intact distal pulses.  Exam reveals no gallop and no friction rub.   No murmur heard. At 72 per minute  Pulmonary/Chest: Effort normal and breath sounds normal. No respiratory distress. He has no wheezes. He has no rales. He exhibits no tenderness.  No chest wall masses. No axillary adenopathy  Abdominal: Soft. Bowel sounds are normal. He exhibits no mass. There is no tenderness. There is no rebound and no guarding.  No inguinal adenopathy or abdominal bruits  Musculoskeletal: Normal range of motion. He exhibits no edema and no tenderness.  Lymphadenopathy:    He has no cervical adenopathy.  Neurological: He is alert and oriented to person, place, and time. He has normal reflexes. No cranial nerve deficit.  Skin: Skin is warm and dry. No rash noted. No erythema. No pallor.  Dry skin elevated skin lesion of a suspicious nature the right upper medial thigh  Psychiatric:  He has a normal mood and affect. His behavior is normal. Judgment and thought content normal.   BP 117/75  Pulse 65  Temp(Src) 96.7 F (35.9 C) (Oral)  Ht 6' (1.829 m)  Wt 215 lb (97.523 kg)  BMI 29.15 kg/m2        Assessment & Plan:  1. Elevated PSA - POCT CBC  2. Essential hypertension, benign - POCT CBC - BMP8+EGFR - Hepatic function panel  3. History of thyroid cancer  - POCT CBC  4. Hyperlipidemia - POCT CBC - NMR, lipoprofile  5.  Metabolic syndrome - POCT CBC - POCT glycosylated hemoglobin (Hb A1C)  6. Osteoarthritis of spine with radiculopathy, lumbar region - POCT CBC  7. Vitamin D deficiency - Vit D  25 hydroxy (rtn osteoporosis monitoring)  Meds ordered this encounter  Medications  . HYDROcodone-acetaminophen (NORCO) 10-325 MG per tablet    Sig: Take 1 tablet by mouth every 6 (six) hours as needed for severe pain.    Dispense:  60 tablet    Refill:  0   Patient Instructions                       Medicare Annual Wellness Visit  [facility] and the medical providers at [facility] strive to bring you the best medical care.  In doing so we not only want to address your current medical conditions and concerns but also to detect new conditions early and prevent illness, disease and health-related problems.    Medicare offers a yearly Wellness Visit which allows our clinical staff to assess your need for preventative services including immunizations, lifestyle education, counseling to decrease risk of preventable diseases and screening for fall risk and other medical concerns.    This visit is provided free of charge (no copay) for all Medicare recipients. The clinical pharmacists at [facility] have begun to conduct these Wellness Visits which will also include a thorough review of all your medications.    As you primary medical provider recommend that you make an appointment for your Annual Wellness Visit if you have not done so already this year.  You may set up this appointment before you leave today or you may call back ([phone]) and schedule an appointment.  Please make sure when you call that you mention that you are scheduling your Annual Wellness Visit with the clinical pharmacist so that the appointment may be made for the proper length of time.     Continue current medications. Continue good therapeutic lifestyle changes which include good diet and  exercise. Fall precautions discussed with patient. If an FOBT was given today- please return it to our front desk. If you are over 70 years old - you may need Prevnar 76 or the adult Pneumonia vaccine.  Return to clinic to remove skin lesion We will call you of the lab work once those results are available Continue to exercise as much as possible while at the same time being careful not to reinjure your back Be sure and get your eye exam done Keep followup appointments with the endocrinologist and the urologist and the neurosurgeon as needed   Arrie Senate MD

## 2014-05-12 NOTE — Patient Instructions (Addendum)
Medicare Annual Wellness Visit  [facility] and the medical providers at [facility] strive to bring you the best medical care.  In doing so we not only want to address your current medical conditions and concerns but also to detect new conditions early and prevent illness, disease and health-related problems.    Medicare offers a yearly Wellness Visit which allows our clinical staff to assess your need for preventative services including immunizations, lifestyle education, counseling to decrease risk of preventable diseases and screening for fall risk and other medical concerns.    This visit is provided free of charge (no copay) for all Medicare recipients. The clinical pharmacists at [facility] have begun to conduct these Wellness Visits which will also include a thorough review of all your medications.    As you primary medical provider recommend that you make an appointment for your Annual Wellness Visit if you have not done so already this year.  You may set up this appointment before you leave today or you may call back ([phone]) and schedule an appointment.  Please make sure when you call that you mention that you are scheduling your Annual Wellness Visit with the clinical pharmacist so that the appointment may be made for the proper length of time.     Continue current medications. Continue good therapeutic lifestyle changes which include good diet and exercise. Fall precautions discussed with patient. If an FOBT was given today- please return it to our front desk. If you are over 3 years old - you may need Prevnar 107 or the adult Pneumonia vaccine.  Return to clinic to remove skin lesion We will call you of the lab work once those results are available Continue to exercise as much as possible while at the same time being careful not to reinjure your back Be sure and get your eye exam done Keep followup appointments  with the endocrinologist and the urologist and the neurosurgeon as needed

## 2014-05-13 LAB — NMR, LIPOPROFILE
CHOLESTEROL: 94 mg/dL — AB (ref 100–199)
HDL CHOLESTEROL BY NMR: 41 mg/dL (ref >39)
HDL PARTICLE NUMBER: 33.7 umol/L (ref >=30.5)
LDL Particle Number: 476 nmol/L (ref <1000)
LDL Size: 20.3 nm (ref >20.5)
LDLC SERPL CALC-MCNC: 39 mg/dL (ref 0–99)
LP-IR Score: 43 (ref <=45)
Small LDL Particle Number: 293 nmol/L (ref <=527)
TRIGLYCERIDES BY NMR: 72 mg/dL (ref 0–149)

## 2014-05-13 LAB — BMP8+EGFR
BUN/Creatinine Ratio: 18 (ref 10–22)
BUN: 17 mg/dL (ref 8–27)
CALCIUM: 9.1 mg/dL (ref 8.6–10.2)
CHLORIDE: 107 mmol/L (ref 97–108)
CO2: 24 mmol/L (ref 18–29)
Creatinine, Ser: 0.96 mg/dL (ref 0.76–1.27)
GFR calc non Af Amer: 83 mL/min/{1.73_m2} (ref >59)
GFR, EST AFRICAN AMERICAN: 95 mL/min/{1.73_m2} (ref >59)
GLUCOSE: 123 mg/dL — AB (ref 65–99)
Potassium: 4.4 mmol/L (ref 3.5–5.2)
Sodium: 144 mmol/L (ref 134–144)

## 2014-05-13 LAB — HEPATIC FUNCTION PANEL
ALBUMIN: 4.3 g/dL (ref 3.6–4.8)
ALK PHOS: 50 IU/L (ref 39–117)
ALT: 33 IU/L (ref 0–44)
AST: 21 IU/L (ref 0–40)
Bilirubin, Direct: 0.2 mg/dL (ref 0.00–0.40)
TOTAL PROTEIN: 6 g/dL (ref 6.0–8.5)
Total Bilirubin: 0.6 mg/dL (ref 0.0–1.2)

## 2014-05-13 LAB — VITAMIN D 25 HYDROXY (VIT D DEFICIENCY, FRACTURES): Vit D, 25-Hydroxy: 44.1 ng/mL (ref 30.0–100.0)

## 2014-05-22 DIAGNOSIS — H40039 Anatomical narrow angle, unspecified eye: Secondary | ICD-10-CM | POA: Diagnosis not present

## 2014-05-22 DIAGNOSIS — H2510 Age-related nuclear cataract, unspecified eye: Secondary | ICD-10-CM | POA: Diagnosis not present

## 2014-05-25 ENCOUNTER — Ambulatory Visit: Payer: Medicare Other | Admitting: Family Medicine

## 2014-06-04 ENCOUNTER — Ambulatory Visit: Payer: Medicare Other | Admitting: Family Medicine

## 2014-06-19 ENCOUNTER — Other Ambulatory Visit: Payer: Self-pay | Admitting: Family Medicine

## 2014-06-19 ENCOUNTER — Ambulatory Visit (INDEPENDENT_AMBULATORY_CARE_PROVIDER_SITE_OTHER): Payer: Medicare Other | Admitting: Family Medicine

## 2014-06-19 ENCOUNTER — Encounter: Payer: Self-pay | Admitting: Family Medicine

## 2014-06-19 VITALS — BP 105/64 | HR 65 | Temp 98.2°F | Ht 72.0 in | Wt 210.0 lb

## 2014-06-19 DIAGNOSIS — L989 Disorder of the skin and subcutaneous tissue, unspecified: Secondary | ICD-10-CM | POA: Diagnosis not present

## 2014-06-19 DIAGNOSIS — L821 Other seborrheic keratosis: Secondary | ICD-10-CM | POA: Diagnosis not present

## 2014-06-19 NOTE — Patient Instructions (Signed)
Keep excised skin area dry for 24 hours. You may then take a bath or shower and does clean the area with some alcohol and reapply dressing. Return to clinic in 4 days to recheck area of excision

## 2014-06-19 NOTE — Progress Notes (Signed)
   Subjective:    Patient ID: Bradley Wong, male    DOB: Jan 09, 1946, 68 y.o.   MRN: [id number]  HPI Patient here today for mole removal from right thigh area. The patient comes in today for removal of this suspicious nevus of his right thigh that has been enlarging over the past several weeks.        Patient Active Problem List   Diagnosis Date Noted  . Osteoarthritis of lumbar spine 05/12/2014  . Vitamin D deficiency 05/12/2014  . Metabolic syndrome 48/54/6270  . Elevated PSA 01/22/2013  . Essential hypertension, benign 01/06/2013  . History of thyroid cancer  01/06/2013  . Hyperlipidemia 01/06/2013   Outpatient Encounter Prescriptions as of 06/19/2014  Medication Sig  . aspirin 81 MG tablet Take 81 mg by mouth daily.  . Cholecalciferol (VITAMIN D-3) 5000 UNITS TABS Take 1 tablet by mouth daily.  Marland Kitchen HYDROcodone-acetaminophen (NORCO) 10-325 MG per tablet Take 1 tablet by mouth every 6 (six) hours as needed for severe pain.  Marland Kitchen levothyroxine (SYNTHROID, LEVOTHROID) 112 MCG tablet Take 112 mcg by mouth daily.  Marland Kitchen olmesartan (BENICAR) 20 MG tablet Take 1 tablet (20 mg total) by mouth daily.  . rosuvastatin (CRESTOR) 40 MG tablet TAKE ONE TABLET BY MOUTH ONE TIME DAILY as directed    Review of Systems  Constitutional: Negative.   HENT: Negative.   Eyes: Negative.   Respiratory: Negative.   Cardiovascular: Negative.   Gastrointestinal: Negative.   Endocrine: Negative.   Genitourinary: Negative.   Musculoskeletal: Negative.   Skin: Negative.        Skin lesion/mole to right upper thigh  Allergic/Immunologic: Negative.   Neurological: Negative.   Hematological: Negative.   Psychiatric/Behavioral: Negative.        Objective:   Physical Exam  Skin: Skin is warm and dry.  The enlarging nevus on the right proximal anterior thigh was its size after infiltration of Xylocaine and cleansing. This lesion was completely excised and sent off for pathology. The remaining skin was  hyfrecated scraped and hyfrecated and a sterile dressing was applied. The patient tolerated the procedure well   BP 105/64  Pulse 65  Temp(Src) 98.2 F (36.8 C) (Oral)  Ht 6' (1.829 m)  Wt 210 lb (95.255 kg)  BMI 28.47 kg/m2        Assessment & Plan:  1. Skin lesion of right leg - Pathology  Patient Instructions  Keep excised skin area dry for 24 hours. You may then take a bath or shower and does clean the area with some alcohol and reapply dressing. Return to clinic in 4 days to recheck area of excision   Arrie Senate MD

## 2014-06-23 LAB — PATHOLOGY

## 2014-06-30 ENCOUNTER — Telehealth: Payer: Self-pay | Admitting: Family Medicine

## 2014-06-30 NOTE — Telephone Encounter (Signed)
appt not needed - place looks wnl and no redness

## 2014-07-23 ENCOUNTER — Other Ambulatory Visit (INDEPENDENT_AMBULATORY_CARE_PROVIDER_SITE_OTHER): Payer: Medicare Other

## 2014-07-23 DIAGNOSIS — R972 Elevated prostate specific antigen [PSA]: Secondary | ICD-10-CM | POA: Diagnosis not present

## 2014-07-24 LAB — PSA, TOTAL AND FREE
PSA, Free Pct: 16.4 %
PSA, Free: 0.54 ng/mL
PSA: 3.3 ng/mL (ref 0.0–4.0)

## 2014-07-31 ENCOUNTER — Ambulatory Visit (INDEPENDENT_AMBULATORY_CARE_PROVIDER_SITE_OTHER): Payer: Medicare Other | Admitting: Urology

## 2014-07-31 DIAGNOSIS — N401 Enlarged prostate with lower urinary tract symptoms: Secondary | ICD-10-CM | POA: Diagnosis not present

## 2014-07-31 DIAGNOSIS — D400 Neoplasm of uncertain behavior of prostate: Secondary | ICD-10-CM

## 2014-07-31 DIAGNOSIS — R972 Elevated prostate specific antigen [PSA]: Secondary | ICD-10-CM | POA: Diagnosis not present

## 2014-08-19 ENCOUNTER — Ambulatory Visit (INDEPENDENT_AMBULATORY_CARE_PROVIDER_SITE_OTHER): Payer: Medicare Other

## 2014-08-19 DIAGNOSIS — Z23 Encounter for immunization: Secondary | ICD-10-CM

## 2014-09-04 ENCOUNTER — Encounter: Payer: Self-pay | Admitting: Family Medicine

## 2014-09-04 ENCOUNTER — Ambulatory Visit (INDEPENDENT_AMBULATORY_CARE_PROVIDER_SITE_OTHER): Payer: Medicare Other | Admitting: Family Medicine

## 2014-09-04 ENCOUNTER — Encounter (INDEPENDENT_AMBULATORY_CARE_PROVIDER_SITE_OTHER): Payer: Self-pay

## 2014-09-04 VITALS — BP 107/73 | HR 72 | Temp 97.3°F | Ht 72.0 in | Wt 215.0 lb

## 2014-09-04 DIAGNOSIS — E559 Vitamin D deficiency, unspecified: Secondary | ICD-10-CM | POA: Diagnosis not present

## 2014-09-04 DIAGNOSIS — E039 Hypothyroidism, unspecified: Secondary | ICD-10-CM

## 2014-09-04 DIAGNOSIS — E785 Hyperlipidemia, unspecified: Secondary | ICD-10-CM

## 2014-09-04 DIAGNOSIS — E1169 Type 2 diabetes mellitus with other specified complication: Secondary | ICD-10-CM | POA: Insufficient documentation

## 2014-09-04 DIAGNOSIS — M4726 Other spondylosis with radiculopathy, lumbar region: Secondary | ICD-10-CM

## 2014-09-04 DIAGNOSIS — R972 Elevated prostate specific antigen [PSA]: Secondary | ICD-10-CM | POA: Diagnosis not present

## 2014-09-04 DIAGNOSIS — I10 Essential (primary) hypertension: Secondary | ICD-10-CM | POA: Diagnosis not present

## 2014-09-04 DIAGNOSIS — Z85850 Personal history of malignant neoplasm of thyroid: Secondary | ICD-10-CM | POA: Diagnosis not present

## 2014-09-04 DIAGNOSIS — C73 Malignant neoplasm of thyroid gland: Secondary | ICD-10-CM | POA: Insufficient documentation

## 2014-09-04 LAB — POCT CBC
Granulocyte percent: 71.8 %G (ref 37–80)
HCT, POC: 43.1 % — AB (ref 43.5–53.7)
HEMOGLOBIN: 14.1 g/dL (ref 14.1–18.1)
LYMPH, POC: 1.4 (ref 0.6–3.4)
MCH: 31.5 pg — AB (ref 27–31.2)
MCHC: 32.8 g/dL (ref 31.8–35.4)
MCV: 96.2 fL (ref 80–97)
MPV: 7 fL (ref 0–99.8)
POC Granulocyte: 4.6 (ref 2–6.9)
POC LYMPH PERCENT: 22.6 %L (ref 10–50)
Platelet Count, POC: 166 10*3/uL (ref 142–424)
RBC: 4.5 M/uL — AB (ref 4.69–6.13)
RDW, POC: 13 %
WBC: 6.4 10*3/uL (ref 4.6–10.2)

## 2014-09-04 NOTE — Progress Notes (Signed)
Subjective:    Patient ID: Bradley Wong, male    DOB: Jul 30, 1946, 68 y.o.   MRN: [id number]  HPI Pt here for follow up and management of chronic medical problems. The patient continues to have postnasal drip and sinus pressure.        Patient Active Problem List   Diagnosis Date Noted  . Osteoarthritis of lumbar spine 05/12/2014  . Vitamin D deficiency 05/12/2014  . Metabolic syndrome 10/14/2445  . Elevated PSA 01/22/2013  . Essential hypertension, benign 01/06/2013  . History of thyroid cancer  01/06/2013  . Hyperlipidemia 01/06/2013   Outpatient Encounter Prescriptions as of 09/04/2014  Medication Sig  . aspirin 81 MG tablet Take 81 mg by mouth daily.  . Cholecalciferol (VITAMIN D-3) 5000 UNITS TABS Take 1 tablet by mouth daily.  Marland Kitchen HYDROcodone-acetaminophen (NORCO) 10-325 MG per tablet Take 1 tablet by mouth every 6 (six) hours as needed for severe pain.  Marland Kitchen levothyroxine (SYNTHROID, LEVOTHROID) 112 MCG tablet Take 112 mcg by mouth daily.  Marland Kitchen olmesartan (BENICAR) 20 MG tablet Take 1 tablet (20 mg total) by mouth daily.  . rosuvastatin (CRESTOR) 40 MG tablet TAKE ONE TABLET BY MOUTH ONE TIME DAILY as directed    Review of Systems  Constitutional: Negative.   HENT: Positive for postnasal drip and sinus pressure.   Eyes: Negative.   Respiratory: Negative.   Cardiovascular: Negative.   Gastrointestinal: Negative.   Endocrine: Negative.   Genitourinary: Negative.   Musculoskeletal: Negative.   Skin: Negative.   Allergic/Immunologic: Negative.   Neurological: Negative.   Hematological: Negative.   Psychiatric/Behavioral: Negative.        Objective:   Physical Exam  Constitutional: He is oriented to person, place, and time. He appears well-developed and well-nourished. No distress.  HENT:  Head: Normocephalic and atraumatic.  Right Ear: External ear normal.  Left Ear: External ear normal.  Mouth/Throat: No oropharyngeal exudate.  There is nasal congestion  bilaterally and the throat is slightly red bilaterally and posteriorly.  Eyes: Conjunctivae and EOM are normal. Pupils are equal, round, and reactive to light. Right eye exhibits no discharge. Left eye exhibits no discharge. No scleral icterus.  Neck: Normal range of motion. Neck supple. No thyromegaly present.  There are no masses or anterior cervical adenopathy  Cardiovascular: Normal rate, regular rhythm, normal heart sounds and intact distal pulses.  Exam reveals no gallop and no friction rub.   No murmur heard. At 72/m without murmur  Pulmonary/Chest: Effort normal and breath sounds normal. No respiratory distress. He has no wheezes. He has no rales. He exhibits no tenderness.  Abdominal: Soft. Bowel sounds are normal. He exhibits no mass. There is no tenderness. There is no rebound and no guarding.  Musculoskeletal: Normal range of motion. He exhibits no edema or tenderness.  Lymphadenopathy:    He has no cervical adenopathy.  Neurological: He is alert and oriented to person, place, and time. He has normal reflexes. No cranial nerve deficit.  Skin: Skin is warm and dry. No rash noted. No erythema. No pallor.  Skin is dry  Psychiatric: He has a normal mood and affect. His behavior is normal. Judgment and thought content normal.  Nursing note and vitals reviewed.  BP 107/73 mmHg  Pulse 72  Temp(Src) 97.3 F (36.3 C) (Oral)  Ht 6' (1.829 m)  Wt 215 lb (97.523 kg)  BMI 29.15 kg/m2        Assessment & Plan:  1. Elevated PSA - POCT CBC -continue to  follow-up with Dr. Jeffie Pollock  2. Essential hypertension, benign - POCT CBC - BMP8+EGFR - Hepatic function panel  3. Vitamin D deficiency - POCT CBC - Vit D  25 hydroxy (rtn osteoporosis monitoring)  4. Hyperlipemia - POCT CBC - NMR, lipoprofile  5. Hypothyroidism, unspecified hypothyroidism type - POCT CBC - Thyroid Panel With TSH  6. History of thyroid cancer -continue to follow-up with Dr. Aline Brochure  7.Chronic low back  pain secondary to osteoarthritis with neuropathy -Followed by Dr. Joya Salm  Patient Instructions                       Medicare Annual [facility] and the medical providers at [facility] strive to bring you the best medical care.  In doing so we not only want to address your current medical conditions and concerns but also to detect new conditions early and prevent illness, disease and health-related problems.    Medicare offers a yearly Wellness Visit which allows our clinical staff to assess your need for preventative services including immunizations, lifestyle education, counseling to decrease risk of preventable diseases and screening for fall risk and other medical concerns.    This visit is provided free of charge (no copay) for all Medicare recipients. The clinical pharmacists at [facility] have begun to conduct these Wellness Visits which will also include a thorough review of all your medications.    As you primary medical provider recommend that you make an appointment for your Annual Wellness Visit if you have not done so already this year.  You may set up this appointment before you leave today or you may call back ([phone]) and schedule an appointment.  Please make sure when you call that you mention that you are scheduling your Annual Wellness Visit with the clinical pharmacist so that the appointment may be made for the proper length of time.      Continue current medications. Continue good therapeutic lifestyle changes which include good diet and exercise. Fall precautions discussed with patient. If an FOBT was given today- please return it to our front desk. If you are over 68 years old - you may need Prevnar 35 or the adult Pneumonia vaccine.  Flu Shots will be available at our office starting mid- September. Please call and schedule a FLU CLINIC APPOINTMENT.   We will check with your insurance regarding the  shingles shot gargle with warm salty water and take Tylenol or ibuprofen as needed for aches pains and fever Use Flonase nasal spray over-the-counter 1-2 sprays each nostril at night Continue Tylenol with decongestant Use Mucinex maximum strength, blue and white in color, over-the-counter one twice a day with a large glass of water as needed for cough and congestion Use saline nose spray frequently during the day Keep follow up appointments with doctors in [place] and [place]   Arrie Senate MD

## 2014-09-04 NOTE — Patient Instructions (Addendum)
Medicare Annual Wellness Visit  [facility] and the medical providers at [facility] strive to bring you the best medical care.  In doing so we not only want to address your current medical conditions and concerns but also to detect new conditions early and prevent illness, disease and health-related problems.    Medicare offers a yearly Wellness Visit which allows our clinical staff to assess your need for preventative services including immunizations, lifestyle education, counseling to decrease risk of preventable diseases and screening for fall risk and other medical concerns.    This visit is provided free of charge (no copay) for all Medicare recipients. The clinical pharmacists at [facility] have begun to conduct these Wellness Visits which will also include a thorough review of all your medications.    As you primary medical provider recommend that you make an appointment for your Annual Wellness Visit if you have not done so already this year.  You may set up this appointment before you leave today or you may call back ([phone]) and schedule an appointment.  Please make sure when you call that you mention that you are scheduling your Annual Wellness Visit with the clinical pharmacist so that the appointment may be made for the proper length of time.      Continue current medications. Continue good therapeutic lifestyle changes which include good diet and exercise. Fall precautions discussed with patient. If an FOBT was given today- please return it to our front desk. If you are over 69 years old - you may need Prevnar 72 or the adult Pneumonia vaccine.  Flu Shots will be available at our office starting mid- September. Please call and schedule a FLU CLINIC APPOINTMENT.   We will check with your insurance regarding the shingles shot gargle with warm salty water and take Tylenol or ibuprofen as needed for aches pains and  fever Use Flonase nasal spray over-the-counter 1-2 sprays each nostril at night Continue Tylenol with decongestant Use Mucinex maximum strength, blue and white in color, over-the-counter one twice a day with a large glass of water as needed for cough and congestion Use saline nose spray frequently during the day Keep follow up appointments with doctors in [place] and [place]

## 2014-09-05 LAB — BMP8+EGFR
BUN / CREAT RATIO: 13 (ref 10–22)
BUN: 14 mg/dL (ref 8–27)
CO2: 22 mmol/L (ref 18–29)
CREATININE: 1.06 mg/dL (ref 0.76–1.27)
Calcium: 8.8 mg/dL (ref 8.6–10.2)
Chloride: 104 mmol/L (ref 97–108)
GFR, EST AFRICAN AMERICAN: 85 mL/min/{1.73_m2} (ref >59)
GFR, EST NON AFRICAN AMERICAN: 73 mL/min/{1.73_m2} (ref >59)
Glucose: 127 mg/dL — ABNORMAL HIGH (ref 65–99)
Potassium: 4.8 mmol/L (ref 3.5–5.2)
SODIUM: 141 mmol/L (ref 134–144)

## 2014-09-05 LAB — NMR, LIPOPROFILE
Cholesterol: 103 mg/dL (ref 100–199)
HDL Cholesterol by NMR: 37 mg/dL — ABNORMAL LOW (ref >39)
HDL Particle Number: 30.9 umol/L (ref >=30.5)
LDL Particle Number: 710 nmol/L (ref <1000)
LDL SIZE: 20.3 nm (ref >20.5)
LDL-C: 53 mg/dL (ref 0–99)
LP-IR Score: 50 — ABNORMAL HIGH (ref <=45)
SMALL LDL PARTICLE NUMBER: 386 nmol/L (ref <=527)
Triglycerides by NMR: 67 mg/dL (ref 0–149)

## 2014-09-05 LAB — VITAMIN D 25 HYDROXY (VIT D DEFICIENCY, FRACTURES): VIT D 25 HYDROXY: 62.7 ng/mL (ref 30.0–100.0)

## 2014-09-05 LAB — HEPATIC FUNCTION PANEL
ALT: 30 IU/L (ref 0–44)
AST: 18 IU/L (ref 0–40)
Albumin: 4.3 g/dL (ref 3.6–4.8)
Alkaline Phosphatase: 55 IU/L (ref 39–117)
BILIRUBIN DIRECT: 0.16 mg/dL (ref 0.00–0.40)
BILIRUBIN TOTAL: 0.5 mg/dL (ref 0.0–1.2)
Total Protein: 6.2 g/dL (ref 6.0–8.5)

## 2014-09-05 LAB — THYROID PANEL WITH TSH
Free Thyroxine Index: 2.9 (ref 1.2–4.9)
T3 Uptake Ratio: 33 % (ref 24–39)
T4, Total: 8.8 ug/dL (ref 4.5–12.0)
TSH: 0.996 u[IU]/mL (ref 0.450–4.500)

## 2014-09-08 ENCOUNTER — Telehealth: Payer: Self-pay | Admitting: *Deleted

## 2014-09-08 LAB — POCT GLYCOSYLATED HEMOGLOBIN (HGB A1C)

## 2014-09-08 NOTE — Addendum Note (Signed)
Addended by: Pollyann Kennedy F on: 09/08/2014 11:15 AM   Modules accepted: Orders

## 2014-09-08 NOTE — Telephone Encounter (Signed)
-----   Message from Chipper Herb, MD sent at 09/05/2014  5:00 PM EST ----- The blood sugar is elevated at 127. The creatinine, the most important kidney function test is within normal limits. The electrolytes including potassium are within normal limits.LAB-----please add a hemoglobin A1c because of the elevated blood sugar++++++ All liver function tests are within normal limits All cholesterol numbers with advanced lipid testing are excellent and at goal, continue current treatment The vitamin D level is good, continue current treatment---- Thyroid function tests are within normal limits, please send a copy of this report to Dr. Aline Brochure at [facility]

## 2014-09-08 NOTE — Addendum Note (Signed)
Addended by: Selmer Dominion on: 09/08/2014 11:12 AM   Modules accepted: Orders

## 2014-09-08 NOTE — Telephone Encounter (Signed)
Aware of results. Lab results routed to Dr. Kristian Covey.

## 2014-09-09 ENCOUNTER — Telehealth: Payer: Self-pay | Admitting: *Deleted

## 2014-09-09 NOTE — Telephone Encounter (Signed)
-----   Message from Chipper Herb, MD sent at 09/08/2014  6:44 PM EST ----- The hemoglobin A1c is 5.9% and this is in the prediabetic range meaning the average blood sugar for the past 3 months has been in the prediabetic range. He needs to continue to watch his diet closely and exercise as much as possible

## 2014-09-09 NOTE — Telephone Encounter (Signed)
Aware. 

## 2014-09-19 ENCOUNTER — Other Ambulatory Visit: Payer: Self-pay | Admitting: Family Medicine

## 2014-09-21 NOTE — Telephone Encounter (Signed)
Refilled per protocol, next appt in April, just seen this month

## 2015-01-22 ENCOUNTER — Ambulatory Visit: Payer: Medicare Other | Admitting: Family Medicine

## 2015-01-27 ENCOUNTER — Other Ambulatory Visit (INDEPENDENT_AMBULATORY_CARE_PROVIDER_SITE_OTHER): Payer: Medicare Other

## 2015-01-27 DIAGNOSIS — D400 Neoplasm of uncertain behavior of prostate: Secondary | ICD-10-CM

## 2015-01-27 DIAGNOSIS — R972 Elevated prostate specific antigen [PSA]: Secondary | ICD-10-CM | POA: Diagnosis not present

## 2015-01-27 NOTE — Progress Notes (Signed)
Lab only 

## 2015-01-28 LAB — PSA, TOTAL AND FREE
PSA FREE PCT: 20.3 %
PSA FREE: 0.61 ng/mL
PSA: 3 ng/mL (ref 0.0–4.0)

## 2015-02-03 ENCOUNTER — Ambulatory Visit (INDEPENDENT_AMBULATORY_CARE_PROVIDER_SITE_OTHER): Payer: Medicare Other | Admitting: Family Medicine

## 2015-02-03 ENCOUNTER — Encounter: Payer: Self-pay | Admitting: Family Medicine

## 2015-02-03 VITALS — BP 118/81 | HR 64 | Temp 97.2°F | Ht 72.0 in | Wt 215.0 lb

## 2015-02-03 DIAGNOSIS — R972 Elevated prostate specific antigen [PSA]: Secondary | ICD-10-CM

## 2015-02-03 DIAGNOSIS — E559 Vitamin D deficiency, unspecified: Secondary | ICD-10-CM | POA: Diagnosis not present

## 2015-02-03 DIAGNOSIS — E785 Hyperlipidemia, unspecified: Secondary | ICD-10-CM | POA: Diagnosis not present

## 2015-02-03 DIAGNOSIS — E8881 Metabolic syndrome: Secondary | ICD-10-CM

## 2015-02-03 DIAGNOSIS — E039 Hypothyroidism, unspecified: Secondary | ICD-10-CM

## 2015-02-03 DIAGNOSIS — I10 Essential (primary) hypertension: Secondary | ICD-10-CM | POA: Diagnosis not present

## 2015-02-03 DIAGNOSIS — Z85850 Personal history of malignant neoplasm of thyroid: Secondary | ICD-10-CM | POA: Diagnosis not present

## 2015-02-03 NOTE — Patient Instructions (Addendum)
Medicare Annual Wellness Visit  [facility] and the medical providers at [facility] strive to bring you the best medical care.  In doing so we not only want to address your current medical conditions and concerns but also to detect new conditions early and prevent illness, disease and health-related problems.    Medicare offers a yearly Wellness Visit which allows our clinical staff to assess your need for preventative services including immunizations, lifestyle education, counseling to decrease risk of preventable diseases and screening for fall risk and other medical concerns.    This visit is provided free of charge (no copay) for all Medicare recipients. The clinical pharmacists at [facility] have begun to conduct these Wellness Visits which will also include a thorough review of all your medications.    As you primary medical provider recommend that you make an appointment for your Annual Wellness Visit if you have not done so already this year.  You may set up this appointment before you leave today or you may call back ([phone]) and schedule an appointment.  Please make sure when you call that you mention that you are scheduling your Annual Wellness Visit with the clinical pharmacist so that the appointment may be made for the proper length of time.     Continue current medications. Continue good therapeutic lifestyle changes which include good diet and exercise. Fall precautions discussed with patient. If an FOBT was given today- please return it to our front desk. If you are over 23 years old - you may need Prevnar 57 or the adult Pneumonia vaccine.  Flu Shots are still available at our office. If you still haven't had one please call to set up a nurse visit to get one.   After your visit with [place] today you will receive a survey in the mail or online from [employer] regarding your care with [place]. Please take a moment to  fill this out. Your feedback is very important to [place] as you can help [place] better understand your patient needs as well as improve your experience and satisfaction. WE CARE ABOUT YOU!!!   Keep follow-up appointments with neurosurgery as needed, endocrinology yearly, and urology for prostate Try to take more time for yourself and get more walking in. Drink plenty of water

## 2015-02-03 NOTE — Progress Notes (Signed)
Subjective:    Patient ID: Bradley Wong, male    DOB: 1946-10-17, 69 y.o.   MRN: [id number]  HPI Pt here for follow up and management of chronic medical problems which includes hypertension, hyperlipidemia, and hypothyroid. He is taking medications regularly. The patient is doing well and in good spirits. He sees the urologist next week because of his prostate. He'll be given an FOBT to return and will return to the office fasting for lab work. He does have some shortness of breath at times especially with climbing and heel. He did see the cardiologist and had a stress test about a year ago and everything was good. When he has the shortness of breath he does not have any chest pain. He has been under a lot of stress with her son who has been temporarily disabled and a mother-in-law that's in her 36s and he has not had a lot of time to exercise and take care of himself. He denies GI symptoms, blood in the stool etc. he does have an upcoming appointment with the urologist as indicated because of an elevated PSA in the past. He does see an endocrinologist in [place] yearly because of a past history of thyroid cancer.       Patient Active Problem List   Diagnosis Date Noted  . History of thyroid cancer 09/04/2014  . Hyperlipemia 09/04/2014  . Thyroid activity decreased 09/04/2014  . Osteoarthritis of spine with radiculopathy, lumbar region 09/04/2014  . Osteoarthritis of lumbar spine 05/12/2014  . Vitamin D deficiency 05/12/2014  . Metabolic syndrome 56/38/9373  . Elevated PSA 01/22/2013  . Essential hypertension, benign 01/06/2013  . History of thyroid cancer  01/06/2013  . Hyperlipidemia 01/06/2013   Outpatient Encounter Prescriptions as of 02/03/2015  Medication Sig  . aspirin 81 MG tablet Take 81 mg by mouth daily.  Marland Kitchen BENICAR 40 MG tablet TAKE ONE TABLET BY MOUTH ONE TIME DAILY  . Cholecalciferol (VITAMIN D-3) 5000 UNITS TABS Take 1 tablet by mouth daily.  . CRESTOR 40 MG  tablet TAKE ONE TABLET BY MOUTH ONE TIME DAILY  . HYDROcodone-acetaminophen (NORCO) 10-325 MG per tablet Take 1 tablet by mouth every 6 (six) hours as needed for severe pain.  Marland Kitchen levothyroxine (SYNTHROID, LEVOTHROID) 112 MCG tablet Take 112 mcg by mouth daily.  . [DISCONTINUED] olmesartan (BENICAR) 20 MG tablet Take 1 tablet (20 mg total) by mouth daily.    Review of Systems  Constitutional: Negative.   HENT: Negative.   Eyes: Negative.   Respiratory: Negative.   Cardiovascular: Negative.   Gastrointestinal: Negative.   Endocrine: Negative.   Genitourinary: Negative.   Musculoskeletal: Negative.   Skin: Negative.   Allergic/Immunologic: Negative.   Neurological: Negative.   Hematological: Negative.   Psychiatric/Behavioral: Negative.        Objective:   Physical Exam  Constitutional: He is oriented to person, place, and time. He appears well-developed and well-nourished. No distress.  HENT:  Head: Normocephalic and atraumatic.  Right Ear: External ear normal.  Left Ear: External ear normal.  Mouth/Throat: Oropharynx is clear and moist. No oropharyngeal exudate.  Some nasal congestion.  Eyes: Conjunctivae and EOM are normal. Pupils are equal, round, and reactive to light. Right eye exhibits no discharge. Left eye exhibits no discharge. No scleral icterus.  Neck: Normal range of motion. Neck supple. No thyromegaly present.  No masses in the neck, adenopathy or carotid bruits  Cardiovascular: Normal rate, regular rhythm, normal heart sounds and intact distal pulses.  No murmur heard. At 72/m  Pulmonary/Chest: Effort normal and breath sounds normal. No respiratory distress. He has no wheezes. He has no rales. He exhibits no tenderness.  Clear anteriorly and posteriorly, no axillary adenopathy  Abdominal: Soft. Bowel sounds are normal. He exhibits no mass. There is no tenderness. There is no rebound and no guarding.  The abdomen was nontender without masses or bruits and no  inguinal adenopathy  Genitourinary:  This exam will be done by the urologist next week  Musculoskeletal: Normal range of motion. He exhibits no edema or tenderness.  The patient has complained of some pain in the right great toe area. Upon palpation there was no tenderness or masses or a survey showed all wires there was no redness or rubor  Lymphadenopathy:    He has no cervical adenopathy.  Neurological: He is alert and oriented to person, place, and time. He has normal reflexes. No cranial nerve deficit.  Skin: Skin is warm and dry. No rash noted. No erythema. No pallor.  Psychiatric: He has a normal mood and affect. His behavior is normal. Judgment and thought content normal.  Nursing note and vitals reviewed.  BP 118/81 mmHg  Pulse 64  Temp(Src) 97.2 F (36.2 C) (Oral)  Ht 6' (1.829 m)  Wt 215 lb (97.523 kg)  BMI 29.15 kg/m2        Assessment & Plan:  1. Elevated PSA -Continue follow-up with urology - POCT CBC; Future  2. Essential hypertension, benign -Blood pressure is good today and patient should continue with current medication - POCT CBC; Future - BMP8+EGFR; Future - Hepatic function panel; Future  3. Hyperlipemia -Cholesterol numbers have been good in the past and he should continue with current treatment, recognizing that he is been less active because of family situation that the next cholesterol may be somewhat higher. - POCT CBC; Future - NMR, lipoprofile; Future  4. Vitamin D deficiency -Continue current treatment pending results of lab work - POCT CBC; Future - Vit D  25 hydroxy (rtn osteoporosis monitoring); Future  5. Hypothyroidism, unspecified hypothyroidism type -The patient has had thyroid cancer and he is followed regularly by Dr. Aline Broch[facility] at [facility]. The goal is to keep his TSH low - POCT CBC; Future - Thyroid Panel With TSH; Future  6. History of thyroid cancer -Continue regular follow-up with Dr.  Aline Brochure - POCT CBC; Future - Thyroid Panel With TSH; Future  7. Metabolic syndrome -Continue to work on diet and exercise and try to do this more aggressively - POCT CBC; Future - BMP8+EGFR; Future  Patient Instructions                       Medicare Annual Wellness Visit  [facility] and the medical providers at [facility] strive to bring you the best medical care.  In doing so we not only want to address your current medical conditions and concerns but also to detect new conditions early and prevent illness, disease and health-related problems.    Medicare offers a yearly Wellness Visit which allows our clinical staff to assess your need for preventative services including immunizations, lifestyle education, counseling to decrease risk of preventable diseases and screening for fall risk and other medical concerns.    This visit is provided free of charge (no copay) for all Medicare recipients. The clinical pharmacists at [facility] have begun to conduct these Wellness Visits which will also include a  thorough review of all your medications.    As you primary medical provider recommend that you make an appointment for your Annual Wellness Visit if you have not done so already this year.  You may set up this appointment before you leave today or you may call back ([phone]) and schedule an appointment.  Please make sure when you call that you mention that you are scheduling your Annual Wellness Visit with the clinical pharmacist so that the appointment may be made for the proper length of time.     Continue current medications. Continue good therapeutic lifestyle changes which include good diet and exercise. Fall precautions discussed with patient. If an FOBT was given today- please return it to our front desk. If you are over 72 years old - you may need Prevnar 83 or the adult Pneumonia vaccine.  Flu Shots are still available at our office.  If you still haven't had one please call to set up a nurse visit to get one.   After your visit with [place] today you will receive a survey in the mail or online from [employer] regarding your care with [place]. Please take a moment to fill this out. Your feedback is very important to [place] as you can help [place] better understand your patient needs as well as improve your experience and satisfaction. WE CARE ABOUT YOU!!!   Keep follow-up appointments with neurosurgery as needed, endocrinology yearly, and urology for prostate Try to take more time for yourself and get more walking in. Drink plenty of water   Arrie Senate MD

## 2015-02-04 ENCOUNTER — Other Ambulatory Visit: Payer: Medicare Other

## 2015-02-04 ENCOUNTER — Encounter: Payer: Self-pay | Admitting: Family Medicine

## 2015-02-04 ENCOUNTER — Ambulatory Visit: Payer: Medicare Other | Admitting: Family Medicine

## 2015-02-04 DIAGNOSIS — R739 Hyperglycemia, unspecified: Secondary | ICD-10-CM

## 2015-02-04 DIAGNOSIS — I10 Essential (primary) hypertension: Secondary | ICD-10-CM | POA: Diagnosis not present

## 2015-02-04 DIAGNOSIS — Z85850 Personal history of malignant neoplasm of thyroid: Secondary | ICD-10-CM | POA: Diagnosis not present

## 2015-02-04 DIAGNOSIS — E785 Hyperlipidemia, unspecified: Secondary | ICD-10-CM | POA: Diagnosis not present

## 2015-02-04 DIAGNOSIS — E559 Vitamin D deficiency, unspecified: Secondary | ICD-10-CM | POA: Diagnosis not present

## 2015-02-04 DIAGNOSIS — E8881 Metabolic syndrome: Secondary | ICD-10-CM | POA: Diagnosis not present

## 2015-02-04 DIAGNOSIS — E039 Hypothyroidism, unspecified: Secondary | ICD-10-CM | POA: Diagnosis not present

## 2015-02-04 DIAGNOSIS — D696 Thrombocytopenia, unspecified: Secondary | ICD-10-CM | POA: Insufficient documentation

## 2015-02-04 LAB — POCT CBC
Granulocyte percent: 65.9 %G (ref 37–80)
HEMATOCRIT: 45.6 % (ref 43.5–53.7)
Hemoglobin: 14.5 g/dL (ref 14.1–18.1)
Lymph, poc: 1.7 (ref 0.6–3.4)
MCH, POC: 31 pg (ref 27–31.2)
MCHC: 31.9 g/dL (ref 31.8–35.4)
MCV: 97.4 fL — AB (ref 80–97)
MPV: 7.9 fL (ref 0–99.8)
POC GRANULOCYTE: 3.6 (ref 2–6.9)
POC LYMPH PERCENT: 30.3 %L (ref 10–50)
Platelet Count, POC: 137 10*3/uL — AB (ref 142–424)
RBC: 4.68 M/uL — AB (ref 4.69–6.13)
RDW, POC: 13.3 %
WBC: 5.5 10*3/uL (ref 4.6–10.2)

## 2015-02-04 NOTE — Addendum Note (Signed)
Addended by: Earlene Plater on: 02/04/2015 08:25 AM   Modules accepted: Orders

## 2015-02-05 LAB — THYROID PANEL WITH TSH
Free Thyroxine Index: 2.9 (ref 1.2–4.9)
T3 UPTAKE RATIO: 33 % (ref 24–39)
T4, Total: 8.8 ug/dL (ref 4.5–12.0)
TSH: 2.43 u[IU]/mL (ref 0.450–4.500)

## 2015-02-05 LAB — BMP8+EGFR
BUN/Creatinine Ratio: 16 (ref 10–22)
BUN: 16 mg/dL (ref 8–27)
CALCIUM: 9.1 mg/dL (ref 8.6–10.2)
CHLORIDE: 105 mmol/L (ref 97–108)
CO2: 22 mmol/L (ref 18–29)
CREATININE: 0.98 mg/dL (ref 0.76–1.27)
GFR calc Af Amer: 92 mL/min/{1.73_m2} (ref >59)
GFR calc non Af Amer: 80 mL/min/{1.73_m2} (ref >59)
Glucose: 131 mg/dL — ABNORMAL HIGH (ref 65–99)
Potassium: 4.7 mmol/L (ref 3.5–5.2)
Sodium: 140 mmol/L (ref 134–144)

## 2015-02-05 LAB — HEPATIC FUNCTION PANEL
ALT: 36 IU/L (ref 0–44)
AST: 22 IU/L (ref 0–40)
Albumin: 4.4 g/dL (ref 3.6–4.8)
Alkaline Phosphatase: 55 IU/L (ref 39–117)
Bilirubin Total: 0.9 mg/dL (ref 0.0–1.2)
Bilirubin, Direct: 0.27 mg/dL (ref 0.00–0.40)
Total Protein: 6 g/dL (ref 6.0–8.5)

## 2015-02-05 LAB — NMR, LIPOPROFILE
Cholesterol: 109 mg/dL (ref 100–199)
HDL Cholesterol by NMR: 38 mg/dL — ABNORMAL LOW (ref >39)
HDL PARTICLE NUMBER: 30.1 umol/L — AB (ref >=30.5)
LDL Particle Number: 830 nmol/L (ref <1000)
LDL SIZE: 20.3 nm (ref >20.5)
LDL-C: 57 mg/dL (ref 0–99)
LP-IR SCORE: 67 — AB (ref <=45)
SMALL LDL PARTICLE NUMBER: 446 nmol/L (ref <=527)
Triglycerides by NMR: 70 mg/dL (ref 0–149)

## 2015-02-05 LAB — VITAMIN D 25 HYDROXY (VIT D DEFICIENCY, FRACTURES): Vit D, 25-Hydroxy: 58.4 ng/mL (ref 30.0–100.0)

## 2015-02-08 ENCOUNTER — Telehealth: Payer: Self-pay | Admitting: *Deleted

## 2015-02-08 NOTE — Addendum Note (Signed)
Addended by: Earlene Plater on: 02/08/2015 08:47 AM   Modules accepted: Orders

## 2015-02-08 NOTE — Telephone Encounter (Signed)
Message left to call for results and give [place] the name of endocrinologist.  A1C added by lab.

## 2015-02-08 NOTE — Telephone Encounter (Signed)
-----   Message from Chipper Herb, MD sent at 02/05/2015  9:11 PM EDT ----- The blood sugar is elevated at 131. The creatinine, the most important kidney function test is within normal limits. The electrolytes including potassium are within normal limits.  LAB-----please add a hemoglobin A1c+++++ All liver function tests are within normal limits The vitamin D level is good and stable at 58.4----continue current treatment Cholesterol numbers with advanced lipid testing have a total LDL particle number that remains good and at goal at 830. The LDL C remains good at 57. The triglycerides are good at 70. The HDL particle number or the good cholesterol is low for and this can be improved with more exercise. It also gets better with better diet habits. In the meantime the patient should continue with current treatment. A thyroid profile was done and I was under the impression that the endocrinologist wanted this to be low as 7 within the normal limits. Please send a copy of this report to the patient's endocrinologist at [facility] and this is very important.-----+++++++

## 2015-03-26 ENCOUNTER — Other Ambulatory Visit: Payer: Medicare Other

## 2015-03-26 DIAGNOSIS — Z1212 Encounter for screening for malignant neoplasm of rectum: Secondary | ICD-10-CM

## 2015-03-26 NOTE — Progress Notes (Signed)
Lab only 

## 2015-03-28 LAB — FECAL OCCULT BLOOD, IMMUNOCHEMICAL: FECAL OCCULT BLD: POSITIVE — AB

## 2015-05-21 DIAGNOSIS — Z79899 Other long term (current) drug therapy: Secondary | ICD-10-CM | POA: Diagnosis not present

## 2015-05-21 DIAGNOSIS — Z7982 Long term (current) use of aspirin: Secondary | ICD-10-CM | POA: Diagnosis not present

## 2015-05-21 DIAGNOSIS — C73 Malignant neoplasm of thyroid gland: Secondary | ICD-10-CM | POA: Diagnosis not present

## 2015-05-21 DIAGNOSIS — Z85850 Personal history of malignant neoplasm of thyroid: Secondary | ICD-10-CM | POA: Diagnosis not present

## 2015-05-21 DIAGNOSIS — E890 Postprocedural hypothyroidism: Secondary | ICD-10-CM | POA: Diagnosis not present

## 2015-05-25 ENCOUNTER — Telehealth: Payer: Self-pay | Admitting: *Deleted

## 2015-05-25 NOTE — Telephone Encounter (Signed)
Care everywhere  note

## 2015-06-15 ENCOUNTER — Encounter: Payer: Self-pay | Admitting: Family Medicine

## 2015-06-15 ENCOUNTER — Ambulatory Visit (INDEPENDENT_AMBULATORY_CARE_PROVIDER_SITE_OTHER): Payer: Medicare Other | Admitting: Family Medicine

## 2015-06-15 VITALS — BP 95/64 | HR 63 | Temp 96.9°F | Ht 72.0 in | Wt 209.0 lb

## 2015-06-15 DIAGNOSIS — D696 Thrombocytopenia, unspecified: Secondary | ICD-10-CM | POA: Diagnosis not present

## 2015-06-15 DIAGNOSIS — E559 Vitamin D deficiency, unspecified: Secondary | ICD-10-CM | POA: Diagnosis not present

## 2015-06-15 DIAGNOSIS — E785 Hyperlipidemia, unspecified: Secondary | ICD-10-CM

## 2015-06-15 DIAGNOSIS — Z1212 Encounter for screening for malignant neoplasm of rectum: Secondary | ICD-10-CM | POA: Diagnosis not present

## 2015-06-15 DIAGNOSIS — I10 Essential (primary) hypertension: Secondary | ICD-10-CM

## 2015-06-15 DIAGNOSIS — E039 Hypothyroidism, unspecified: Secondary | ICD-10-CM | POA: Diagnosis not present

## 2015-06-15 DIAGNOSIS — R972 Elevated prostate specific antigen [PSA]: Secondary | ICD-10-CM | POA: Diagnosis not present

## 2015-06-15 DIAGNOSIS — R5383 Other fatigue: Secondary | ICD-10-CM | POA: Diagnosis not present

## 2015-06-15 NOTE — Addendum Note (Signed)
Addended by: Selmer Dominion on: 06/15/2015 02:42 PM   Modules accepted: Orders

## 2015-06-15 NOTE — Progress Notes (Signed)
Subjective:    Patient ID: Bradley Wong, male    DOB: 01/26/1946, 69 y.o.   MRN: [id number]  HPI Pt here for follow up and management of chronic medical problems which includes hypertension, hypothyroid, and hyperlipidemia. He is taking medications regularly. Patient recently saw the endocrinologist regarding his thyroid cancer at [facility]. From the endocrinologist standpoint everything was stable. The patient also has an upcoming visit with the urologist regarding an elevated PSA. This visit with the urologist his next month. He will get lab work today. The patient denies chest pain shortness of breath and trouble swallowing blood in the stool or black tarry bowel movements. He is passing his water without problems. He is up-to-date on his eye exams. He does not have to see the endocrinologist for another year.      Patient Active Problem List   Diagnosis Date Noted  . Thrombocytopenia 02/04/2015  . History of thyroid cancer 09/04/2014  . Hyperlipemia 09/04/2014  . Thyroid activity decreased 09/04/2014  . Osteoarthritis of spine with radiculopathy, lumbar region 09/04/2014  . Osteoarthritis of lumbar spine 05/12/2014  . Vitamin D deficiency 05/12/2014  . Metabolic syndrome 08/26/2535  . Elevated PSA 01/22/2013  . Essential hypertension, benign 01/06/2013  . History of thyroid cancer  01/06/2013  . Hyperlipidemia 01/06/2013   Outpatient Encounter Prescriptions as of 06/15/2015  Medication Sig  . aspirin 81 MG tablet Take 81 mg by mouth daily.  Marland Kitchen BENICAR 40 MG tablet TAKE ONE TABLET BY MOUTH ONE TIME DAILY  . Cholecalciferol (VITAMIN D-3) 5000 UNITS TABS Take 1 tablet by mouth daily.  . CRESTOR 40 MG tablet TAKE ONE TABLET BY MOUTH ONE TIME DAILY  . HYDROcodone-acetaminophen (NORCO) 10-325 MG per tablet Take 1 tablet by mouth every 6 (six) hours as needed for severe pain.  Marland Kitchen levothyroxine (SYNTHROID, LEVOTHROID) 112 MCG tablet Take 112 mcg by mouth  daily.   No facility-administered encounter medications on file as of 06/15/2015.      Review of Systems  Constitutional: Negative.   HENT: Negative.   Eyes: Negative.   Respiratory: Negative.   Cardiovascular: Negative.        Bp running lower than usual.   Gastrointestinal: Negative.   Endocrine: Negative.   Genitourinary: Negative.   Musculoskeletal: Negative.   Skin: Negative.   Allergic/Immunologic: Negative.   Neurological: Negative.   Hematological: Negative.   Psychiatric/Behavioral: Negative.        Objective:   Physical Exam  Constitutional: He is oriented to person, place, and time. He appears well-developed and well-nourished. No distress.  HENT:  Head: Normocephalic and atraumatic.  Right Ear: External ear normal.  Left Ear: External ear normal.  Nose: Nose normal.  Mouth/Throat: Oropharynx is clear and moist. No oropharyngeal exudate.  Eyes: Conjunctivae and EOM are normal. Pupils are equal, round, and reactive to light. Right eye exhibits no discharge. Left eye exhibits no discharge. No scleral icterus.  Neck: Normal range of motion. Neck supple. No thyromegaly present.  No thyromegaly or anterior cervical adenopathy  Cardiovascular: Normal rate, regular rhythm, normal heart sounds and intact distal pulses.  Exam reveals no gallop and no friction rub.   No murmur heard. The heart is 60/m and regular.  Pulmonary/Chest: Effort normal and breath sounds normal. No respiratory distress. He has no wheezes. He has no rales. He exhibits no tenderness.  Lungs are clear anteriorly and posteriorly and there is no axillary adenopathy  Abdominal: Soft. Bowel sounds are normal. He  exhibits no mass. There is no tenderness. There is no rebound and no guarding.  No bruits no liver or spleen enlargement and no inguinal adenopathy  Genitourinary:  This exam will be done by the urologist in September but we will obtain a PSA today.  Musculoskeletal: Normal range of motion. He  exhibits no edema or tenderness.  Lymphadenopathy:    He has no cervical adenopathy.  Neurological: He is alert and oriented to person, place, and time. He has normal reflexes. No cranial nerve deficit.  Skin: Skin is warm and dry. No rash noted. No erythema. No pallor.  Psychiatric: He has a normal mood and affect. His behavior is normal. Judgment and thought content normal.  Nursing note and vitals reviewed.   BP 95/64 mmHg  Pulse 63  Temp(Src) 96.9 F (36.1 C) (Oral)  Ht 6' (1.829 m)  Wt 209 lb (94.802 kg)  BMI 28.34 kg/m2       Assessment & Plan:  1. Essential hypertension, benign -Hold the Benicar as directed and restart if the blood pressure is higher than 1:30 or higher than 90 diastolic. He should only restarted at 10 mg daily. He will bring readings by for review in 4 weeks - - BMP8+EGFR - Hepatic function panel - CBC with Differential/Platelet  2. Hyperlipemia -Continue with current treatment and with aggressive therapeutic lifestyle changes - NMR, lipoprofile - CBC with Differential/Platelet  3. Vitamin D deficiency -Continue with current treatment pending results of lab work - Vit D  25 hydroxy (rtn osteoporosis monitoring)  4. Hypothyroidism, unspecified hypothyroidism type - continue with treatment per directions of endocrinologist   5. Elevated PSA -Follow-up with urology as planned - PSA - CBC with Differential/Platelet  6. Thrombocytopenia -The patient has had no bleeding issues and he we'll continue to monitor this. - CBC with Differential/Platelet  Patient Instructions                       Medicare Annual Wellness Visit  [facility] and the medical providers at [facility] strive to bring you the best medical care.  In doing so we not only want to address your current medical conditions and concerns but also to detect new conditions early and prevent illness, disease and health-related problems.    Medicare offers a  yearly Wellness Visit which allows our clinical staff to assess your need for preventative services including immunizations, lifestyle education, counseling to decrease risk of preventable diseases and screening for fall risk and other medical concerns.    This visit is provided free of charge (no copay) for all Medicare recipients. The clinical pharmacists at [facility] have begun to conduct these Wellness Visits which will also include a thorough review of all your medications.    As you primary medical provider recommend that you make an appointment for your Annual Wellness Visit if you have not done so already this year.  You may set up this appointment before you leave today or you may call back ([phone]) and schedule an appointment.  Please make sure when you call that you mention that you are scheduling your Annual Wellness Visit with the clinical pharmacist so that the appointment may be made for the proper length of time.     Continue current medications. Continue good therapeutic lifestyle changes which include good diet and exercise. Fall precautions discussed with patient. If an FOBT was given today- please return it to our front desk. If you are  over 32 years old - you may need Prevnar 39 or the adult Pneumonia vaccine.   After your visit with [place] today you will receive a survey in the mail or online from [employer] regarding your care with [place]. Please take a moment to fill this out. Your feedback is very important to [place] as you can help [place] better understand your patient needs as well as improve your experience and satisfaction. WE CARE ABOUT YOU!!!   The patient should remain off of his blood pressure medicine for the time being. He should record blood pressure readings daily morning and evening and if the systolic goes greater than 980 or the diastolic is greater than 90 he should restart the Benicar but only take one fourth of a 40 mg tablet which would be 10  mg. He should bring these readings by in 3-4 weeks for [place] to review. He should continue to monitor his blood pressures and bring readings back at the next visit. He should keep watching his sodium intake. We will call him with his lab work results as soon as they become available.   Arrie Senate MD

## 2015-06-15 NOTE — Patient Instructions (Addendum)
Medicare Annual Wellness Visit  [facility] and the medical providers at [facility] strive to bring you the best medical care.  In doing so we not only want to address your current medical conditions and concerns but also to detect new conditions early and prevent illness, disease and health-related problems.    Medicare offers a yearly Wellness Visit which allows our clinical staff to assess your need for preventative services including immunizations, lifestyle education, counseling to decrease risk of preventable diseases and screening for fall risk and other medical concerns.    This visit is provided free of charge (no copay) for all Medicare recipients. The clinical pharmacists at [facility] have begun to conduct these Wellness Visits which will also include a thorough review of all your medications.    As you primary medical provider recommend that you make an appointment for your Annual Wellness Visit if you have not done so already this year.  You may set up this appointment before you leave today or you may call back ([phone]) and schedule an appointment.  Please make sure when you call that you mention that you are scheduling your Annual Wellness Visit with the clinical pharmacist so that the appointment may be made for the proper length of time.     Continue current medications. Continue good therapeutic lifestyle changes which include good diet and exercise. Fall precautions discussed with patient. If an FOBT was given today- please return it to our front desk. If you are over 59 years old - you may need Prevnar 15 or the adult Pneumonia vaccine.   After your visit with [place] today you will receive a survey in the mail or online from [employer] regarding your care with [place]. Please take a moment to fill this out. Your feedback is very important to [place] as you can help [place] better understand your patient needs as well as  improve your experience and satisfaction. WE CARE ABOUT YOU!!!   The patient should remain off of his blood pressure medicine for the time being. He should record blood pressure readings daily morning and evening and if the systolic goes greater than 299 or the diastolic is greater than 90 he should restart the Benicar but only take one fourth of a 40 mg tablet which would be 10 mg. He should bring these readings by in 3-4 weeks for [place] to review. He should continue to monitor his blood pressures and bring readings back at the next visit. He should keep watching his sodium intake. We will call him with his lab work results as soon as they become available.

## 2015-06-16 ENCOUNTER — Other Ambulatory Visit: Payer: Self-pay | Admitting: *Deleted

## 2015-06-16 ENCOUNTER — Telehealth: Payer: Self-pay | Admitting: *Deleted

## 2015-06-16 DIAGNOSIS — E8881 Metabolic syndrome: Secondary | ICD-10-CM

## 2015-06-16 DIAGNOSIS — R748 Abnormal levels of other serum enzymes: Secondary | ICD-10-CM

## 2015-06-16 DIAGNOSIS — I10 Essential (primary) hypertension: Secondary | ICD-10-CM

## 2015-06-16 DIAGNOSIS — E785 Hyperlipidemia, unspecified: Secondary | ICD-10-CM

## 2015-06-16 DIAGNOSIS — R739 Hyperglycemia, unspecified: Secondary | ICD-10-CM

## 2015-06-16 LAB — HEPATIC FUNCTION PANEL
ALBUMIN: 4.4 g/dL (ref 3.6–4.8)
ALT: 35 IU/L (ref 0–44)
AST: 16 IU/L (ref 0–40)
Alkaline Phosphatase: 58 IU/L (ref 39–117)
Bilirubin Total: 0.9 mg/dL (ref 0.0–1.2)
Bilirubin, Direct: 0.26 mg/dL (ref 0.00–0.40)
TOTAL PROTEIN: 6.3 g/dL (ref 6.0–8.5)

## 2015-06-16 LAB — CBC WITH DIFFERENTIAL/PLATELET
BASOS: 1 %
Basophils Absolute: 0 10*3/uL (ref 0.0–0.2)
EOS (ABSOLUTE): 0.1 10*3/uL (ref 0.0–0.4)
EOS: 2 %
HEMATOCRIT: 42.5 % (ref 37.5–51.0)
Hemoglobin: 14.2 g/dL (ref 12.6–17.7)
IMMATURE GRANULOCYTES: 0 %
Immature Grans (Abs): 0 10*3/uL (ref 0.0–0.1)
LYMPHS ABS: 2 10*3/uL (ref 0.7–3.1)
Lymphs: 34 %
MCH: 32.9 pg (ref 26.6–33.0)
MCHC: 33.4 g/dL (ref 31.5–35.7)
MCV: 99 fL — AB (ref 79–97)
MONOS ABS: 0.6 10*3/uL (ref 0.1–0.9)
Monocytes: 10 %
NEUTROS ABS: 3.1 10*3/uL (ref 1.4–7.0)
NEUTROS PCT: 53 %
PLATELETS: 143 10*3/uL — AB (ref 150–379)
RBC: 4.31 x10E6/uL (ref 4.14–5.80)
RDW: 14 % (ref 12.3–15.4)
WBC: 5.8 10*3/uL (ref 3.4–10.8)

## 2015-06-16 LAB — BMP8+EGFR
BUN / CREAT RATIO: 23 — AB (ref 10–22)
BUN: 31 mg/dL — ABNORMAL HIGH (ref 8–27)
CHLORIDE: 104 mmol/L (ref 97–108)
CO2: 22 mmol/L (ref 18–29)
Calcium: 9.5 mg/dL (ref 8.6–10.2)
Creatinine, Ser: 1.32 mg/dL — ABNORMAL HIGH (ref 0.76–1.27)
GFR calc Af Amer: 65 mL/min/{1.73_m2} (ref >59)
GFR calc non Af Amer: 56 mL/min/{1.73_m2} — ABNORMAL LOW (ref >59)
GLUCOSE: 120 mg/dL — AB (ref 65–99)
POTASSIUM: 5 mmol/L (ref 3.5–5.2)
SODIUM: 142 mmol/L (ref 134–144)

## 2015-06-16 LAB — NMR, LIPOPROFILE
CHOLESTEROL: 112 mg/dL (ref 100–199)
HDL CHOLESTEROL BY NMR: 39 mg/dL — AB (ref >39)
HDL PARTICLE NUMBER: 29.3 umol/L — AB (ref >=30.5)
LDL PARTICLE NUMBER: 707 nmol/L (ref <1000)
LDL Size: 20.8 nm (ref >20.5)
LDL-C: 59 mg/dL (ref 0–99)
LP-IR Score: 60 — ABNORMAL HIGH (ref <=45)
Small LDL Particle Number: 298 nmol/L (ref <=527)
TRIGLYCERIDES BY NMR: 71 mg/dL (ref 0–149)

## 2015-06-16 LAB — VITAMIN D 25 HYDROXY (VIT D DEFICIENCY, FRACTURES): VIT D 25 HYDROXY: 62.2 ng/mL (ref 30.0–100.0)

## 2015-06-16 LAB — PSA: PROSTATE SPECIFIC AG, SERUM: 4.1 ng/mL — AB (ref 0.0–4.0)

## 2015-06-16 NOTE — Telephone Encounter (Signed)
-----   Message from Chipper Herb, MD sent at 06/16/2015  1:48 PM EDT ----- The blood sugar is elevated at 120.----Lab, please add a hemoglobin A1c-----the creatinine, the most important kidney function test is slightly elevated at 1.32. Please remind the patient to avoid all NSAIDs like ibuprofen and Aleve and he should have the BMP repeated again in 4 weeks fasting. +++++The electrolytes including potassium are good. All liver function tests are within normal limits Cholesterol numbers with advanced lipid testing have a total LDL particle number that is excellent and good at 707. The LDL C is good at 59. The triglycerides are good at 71. The good cholesterol or the HDL particle number is low and this is consistent with past readings. This is improved by exercising more. The vitamin D level is good and the patient should continue with current treatment. It is 62.2. The PSA remains elevated in the current value is 4.1------4 months ago it was 3.0.---++++++ please forward a copy of this report to Dr. Irine Seal, the urologist at [facility]+++++++ The CBC has a normal white blood cell count. The platelet count is slightly decreased at 143,000. We will continue to watch this. The hemoglobin is good at 14.2.

## 2015-06-17 LAB — FECAL OCCULT BLOOD, IMMUNOCHEMICAL: Fecal Occult Bld: NEGATIVE

## 2015-06-18 LAB — SPECIMEN STATUS REPORT

## 2015-06-18 LAB — HGB A1C W/O EAG: Hgb A1c MFr Bld: 6.4 % — ABNORMAL HIGH (ref 4.8–5.6)

## 2015-06-24 ENCOUNTER — Ambulatory Visit (INDEPENDENT_AMBULATORY_CARE_PROVIDER_SITE_OTHER): Payer: Medicare Other | Admitting: Pharmacist

## 2015-06-24 VITALS — BP 110/68 | HR 68 | Ht 72.0 in | Wt 213.8 lb

## 2015-06-24 DIAGNOSIS — R7309 Other abnormal glucose: Secondary | ICD-10-CM | POA: Diagnosis not present

## 2015-06-24 DIAGNOSIS — R7303 Prediabetes: Secondary | ICD-10-CM

## 2015-06-24 NOTE — Progress Notes (Signed)
CC:  Pre diabetes  HPI: Patient referred by Dr Laurance Flatten for education related to new diagnosis of pre diabetes.  Mr. Blatz states that he has been told in the past that his BG was elevated but this is the first time he was diagnosed as pre diabetic.   He has a significant family history of diabetes - maternal grandmother and 2 brothers have type 2 DM.   Patient 's A1c was 6.4% and FBG was 120 when checked 06/15/2015.  Diet - not following any specific diet.  Drinking Gatorade frequently.   Exercise - none but he does have a physical job.  H/o thyroid cancer - on levothyroxine.   Assessment:  Pre diabetes.   Plan:  Discussed CHO counting diet.  Patient is to limit portion sizes of high CHO containing foods.  Recommended increase vegetables and try to get 3 fruits servings a day. No sugar containing beverages such as juices or gatroade - instead can have G2, water or unsweet tea.  Choose whole grain products instead of those made with white or enriched flour.  Recommended start regular exercise - goal is 30 minutes at least 5 days per week.  Start with 5 or 10 minutes and increase as able.   RTC in 3 months to recheck labs or as needed.   Cherre Robins, PharmD, CPP, CDE

## 2015-06-24 NOTE — Patient Instructions (Signed)
Prediabetes Many people have heard about type 2 diabetes, but its common precursor, prediabetes, doesn't get as much attention. Prediabetes is estimated by CDC to affect 86 million Americans (this includes 51% of people 65 years and older), and an estimated 90% of people with prediabetes don't even know it. According to the CDC, 15-30% of these individuals will develop type 2 diabetes within five years. In other words, as many as 26 million people that currently have prediabetes could develop type 2 diabetes by 2020, effectively doubling the number of people with type 2 diabetes in the [place].  What is prediabetes? Prediabetes is a condition where blood sugar levels are higher than normal, but not high enough to be diagnosed as type 2 diabetes. This occurs when the body has problems in processing glucose properly, and sugar starts to build up in the bloodstream instead of fueling cells in muscles and tissues. Insulin is the hormone that tells cells to take up glucose, and in prediabetes, people typically initially develop insulin resistance (where the body's cells can't respond to insulin as well), and over time (if no actions are taken to reverse the situation) the ability to produce sufficient insulin is reduced. People with prediabetes also commonly have high blood pressure as well as abnormal blood lipids (e.g. cholesterol). These often occur prior to the rise of blood glucose levels.  What are the symptoms of prediabetes? People typically do not have symptoms of prediabetes, which is partially why up to 90% of people don't know they have it. The ADA reports that some people with prediabetes may develop symptoms of type 2 diabetes, though even many people diagnosed with type 2 diabetes show little or no symptoms initially at diagnosis.  How is prediabetes diagnosed? According to the American Diabetes Association, prediabetes can be diagnosed through one of the following tests: 1. A glycated hemoglobin  test, also known as HbA1c or simply A1c, gives an idea of the body's average blood sugar levels from the past two or three months. It is usually done with a small drop of blood from a fingerstick or as part of having blood taken in a doctor's office, hospital, or laboratory. A1c Level Diagnosis  Less than 5.7% Normal  5.7% to 6.4% Prediabetes  6.5% and higher Diabetes  2. A fasting plasma glucose (FPG) test measures a person's blood glucose level after fasting (not eating) for eight hours - this is typically done in the morning. If a test shows positive for prediabetes, a second test should be taken on a different day to confirm the diagnosis. FPG Level Diagnosis  Less than 100 mg/dl Normal  100 mg/dl to 125 mg/dl Prediabetes  126 mg/dl and higher Diabetes   Who is at risk of developing prediabetes? A well-known paper published in the Lancet in 2010 recommends screening for type 2 diabetes (which would also screen for prediabetes) every 3-5 years in all adults over the age of 45, regardless of other risk factors. Overweight and obese adults (a BMI >25 kg/m2) are also at significantly greater risk for developing prediabetes, as well as people with a family history of type 2 diabetes. According to the CDC, several other factors can have moderate influences on prediabetes risk in addition to age, weight, and family history: People with an African American, Hispanic/Latino, American Indian, Asian American, or Pacific Islander racial or ethnic background. The 2015 ADA Standards of Medical Care recommendations suggest Asian Americans with a BMI of 23 or above be screened for type 2 diabetes.    Women with a history of diabetes during pregnancy ("gestational diabetes") or have given birth to a baby weighing nine pounds or more. People who are physically active fewer than three times a week. The CDC offers a fast, online screening test for evaluating the risk for prediabetes. The ADA also offers a screening  test to assess type 2 diabetes risk. Of course, these tests do not themselves confirm a prediabetes diagnosis, but just if someone may be at higher risk of developing it.  Why do people develop prediabetes? Prediabetes develops through a combination of factors that are still being investigated. For sure, lifestyle factors (food, exercise, stress, sleep) play a role, but family history and genetics certainly do as well. It is easy to assume that prediabetes is the result of being overweight, but the relationship is not that simple. While obesity is one underlying cause of insulin resistance, many overweight individuals may never develop prediabetes or type 2 diabetes, and a minority of people with prediabetes have never been overweight. To make matters worse, it can be increasingly difficult to make healthy choices in today's toxic food environment that steers all of us to make the wrong food choices, and there are many factors that can contribute to weight gain in addition to diet.  Is a prediabetes diagnosis serious? There has been significant debate around the term 'prediabetes,' and whether it should be considered cause for alarm. On the one hand, it serves as a risk factor for type 2 diabetes and a host of other complications, including heart disease, and ultimately prediabetes implies that a degree of metabolic problems have started to occur in the body. On the other hand, it places a diagnosis on many people who may never develop type 2 diabetes. Again, according to the CDC, 15-30% of those with prediabetes will develop type 2 diabetes within five years. However, a 2012 Lancet article cites 5-10% of those with prediabetes each year will also revert back to healthy blood sugars. What's critical is not necessarily the cutoff itself, but where someone falls within the ranges listed above. The level of risk of developing type 2 diabetes is closely related to A1c or FPG at diagnosis. Those in the higher ranges  (A1c closer to 6.4%, FPG closer to 125 mg/dl) are much more likely to progress to type 2 diabetes, whereas those at lower ranges (A1c closer to 5.7%, FPG closer to 100 mg/dl) are relatively more likely to revert back to normal glucose levels or stay within the prediabetes range. Age of diagnosis and the level of insulin production still occurring at diagnosis also impact the chances of reverting to normoglycemia (normal blood sugar levels).  What can people with prediabetes do to avoid the progression from prediabetes to type 2 diabetes? The most important action people diagnosed with prediabetes can take is to focus on living a healthy lifestyle. This includes making healthy food choices, controlling portions, and increasing physical activity. Regarding weight control, research shows losing 5-7% (often about 10-20 lbs.) from your initial body weight and keeping off as much of that weight over time as possible is critical to lowering the risk of type 2 diabetes. This task is of course easier said than done, but sustained weight loss over time can be key to improving health and delaying or preventing the onset of type 2 diabetes. Several prediabetes interventions exist based on evidence from the landmark Diabetes Prevention Program (DPP) study. The DPP study reported that moderate weight loss (5-7% of body weight, or ~10-15 lbs.   for someone weighing 200 lbs.), counseling, and education on healthy eating and behavior reduced the risk of developing type 2 diabetes by 58%. Data presented at the ADA 2014 conference showed that after 15 years of follow-up of the DPP study groups, the results were still encouraging: 27% of those in the original lifestyle group had a significant reduction in type 2 diabetes progression compared to the control group. If you or someone you know has been told they have prediabetes, here are a few helpful resources: In-person diabetes prevention programs: The CDC offers a one year long  lifestyle change program through its National Diabetes Prevention Program (NDPP) at various locations throughout the [place] to help participants adopt healthy habits and prevent or delay progression to type 2 diabetes. This program is a major undertaking by the CDC to translate the findings from the DPP study into a real world setting, a significant effort indeed! Online diabetes prevention programs: The CDC has now given pending recognition status to three digital prevention programs: [facility] [facility], [facility], and [facility]. These offer the same one year long educational curriculum as the DPP study, but in an online format. Some insurance companies and employers cover these programs, and you can find more information at the links above. These digital versions are excellent options for those who live far away from NDPP locations or who prefer the anonymity and convenience of doing the program online. Metformin: The DPP study found that metformin, the safest first-line therapy for type 2 diabetes, may help delay the onset of type 2 diabetes in people with prediabetes. Participants who took the low-cost generic drug had a 31% reduced risk of developing type 2 diabetes compared to the control group (those not on metformin or intensive lifestyle intervention). Again, 15-year follow up data showed that 17% of those on metformin continued to have a significant reduction in type 2 progression. At this time, metformin (or any other medication, for that matter) is not currently FDA approved for prediabetes, and it is sometimes prescribed "off-label" by a healthcare provider. Your healthcare provider can give you more information and determine whether metformin is a good option for you.  Can prediabetes be "cured"? In the early stages of prediabetes (and type 2 diabetes), diligent attention to food choices and activity, and most importantly weight loss, can improve blood sugar numbers, effectively "reversing" the disease  and reducing the odds of developing type 2 diabetes. However, some people may have underlying factors (such as family history and genetics) that put them at a greater risk of type 2 diabetes, meaning they will always require careful attention to blood sugar levels and lifestyle choices. Returning to old habits will likely put someone back on the road to prediabetes, and eventually, type 2 diabetes   

## 2015-06-25 ENCOUNTER — Encounter: Payer: Self-pay | Admitting: Pharmacist

## 2015-06-25 DIAGNOSIS — R7303 Prediabetes: Secondary | ICD-10-CM | POA: Insufficient documentation

## 2015-07-16 ENCOUNTER — Other Ambulatory Visit: Payer: Self-pay

## 2015-08-06 ENCOUNTER — Other Ambulatory Visit: Payer: Self-pay | Admitting: Urology

## 2015-08-06 ENCOUNTER — Ambulatory Visit (INDEPENDENT_AMBULATORY_CARE_PROVIDER_SITE_OTHER): Payer: Medicare Other | Admitting: Urology

## 2015-08-06 DIAGNOSIS — R972 Elevated prostate specific antigen [PSA]: Secondary | ICD-10-CM | POA: Diagnosis not present

## 2015-08-06 DIAGNOSIS — D400 Neoplasm of uncertain behavior of prostate: Secondary | ICD-10-CM

## 2015-08-16 ENCOUNTER — Other Ambulatory Visit: Payer: Self-pay | Admitting: Urology

## 2015-08-16 DIAGNOSIS — R972 Elevated prostate specific antigen [PSA]: Secondary | ICD-10-CM

## 2015-08-17 ENCOUNTER — Encounter: Payer: Self-pay | Admitting: Gastroenterology

## 2015-08-18 ENCOUNTER — Other Ambulatory Visit: Payer: Self-pay | Admitting: Family Medicine

## 2015-08-18 MED ORDER — HYDROCODONE-ACETAMINOPHEN 10-325 MG PO TABS: 1.0000 | ORAL_TABLET | Freq: Four times a day (QID) | ORAL | Status: DC | PRN

## 2015-08-18 NOTE — Telephone Encounter (Signed)
Patient aware that rx is ready to be picked up.  

## 2015-09-10 ENCOUNTER — Encounter (HOSPITAL_COMMUNITY): Payer: Self-pay

## 2015-09-10 ENCOUNTER — Ambulatory Visit (HOSPITAL_COMMUNITY)
Admission: RE | Admit: 2015-09-10 | Discharge: 2015-09-10 | Disposition: A | Discharge status: Home / Self Care | Payer: Medicare Other | Source: Ambulatory Visit | Attending: Urology | Admitting: Urology

## 2015-09-10 DIAGNOSIS — R972 Elevated prostate specific antigen [PSA]: Secondary | ICD-10-CM | POA: Diagnosis not present

## 2015-09-10 MED ORDER — CEFTRIAXONE SODIUM 1 G IJ SOLR
1.0000 g | INTRAMUSCULAR | Status: DC
  Administered 2015-09-10: 1 g via INTRAMUSCULAR

## 2015-09-10 MED ORDER — LIDOCAINE HCL (PF) 2 % IJ SOLN
INTRAMUSCULAR | Status: AC
Start: 2015-09-10 — End: 2015-09-11
  Filled 2015-09-10: qty 10

## 2015-09-25 ENCOUNTER — Other Ambulatory Visit: Payer: Self-pay | Admitting: Family Medicine

## 2015-10-13 ENCOUNTER — Ambulatory Visit (INDEPENDENT_AMBULATORY_CARE_PROVIDER_SITE_OTHER): Payer: Medicare Other

## 2015-10-13 ENCOUNTER — Encounter: Payer: Self-pay | Admitting: Family Medicine

## 2015-10-13 ENCOUNTER — Ambulatory Visit (INDEPENDENT_AMBULATORY_CARE_PROVIDER_SITE_OTHER): Payer: Medicare Other | Admitting: Family Medicine

## 2015-10-13 VITALS — BP 110/77 | HR 68 | Temp 97.2°F | Ht 72.0 in | Wt 211.0 lb

## 2015-10-13 DIAGNOSIS — R799 Abnormal finding of blood chemistry, unspecified: Secondary | ICD-10-CM | POA: Diagnosis not present

## 2015-10-13 DIAGNOSIS — R972 Elevated prostate specific antigen [PSA]: Secondary | ICD-10-CM | POA: Diagnosis not present

## 2015-10-13 DIAGNOSIS — E785 Hyperlipidemia, unspecified: Secondary | ICD-10-CM

## 2015-10-13 DIAGNOSIS — E039 Hypothyroidism, unspecified: Secondary | ICD-10-CM | POA: Diagnosis not present

## 2015-10-13 DIAGNOSIS — C73 Malignant neoplasm of thyroid gland: Secondary | ICD-10-CM

## 2015-10-13 DIAGNOSIS — D696 Thrombocytopenia, unspecified: Secondary | ICD-10-CM | POA: Diagnosis not present

## 2015-10-13 DIAGNOSIS — M4726 Other spondylosis with radiculopathy, lumbar region: Secondary | ICD-10-CM

## 2015-10-13 DIAGNOSIS — E559 Vitamin D deficiency, unspecified: Secondary | ICD-10-CM

## 2015-10-13 DIAGNOSIS — I10 Essential (primary) hypertension: Secondary | ICD-10-CM

## 2015-10-13 MED ORDER — HYDROCODONE-ACETAMINOPHEN 10-325 MG PO TABS: 1.0000 | ORAL_TABLET | Freq: Four times a day (QID) | ORAL | Status: DC | PRN

## 2015-10-13 NOTE — Progress Notes (Signed)
Subjective:    Patient ID: Bradley Wong, male    DOB: [birth year], 69 y.o.   MRN: [id number]  HPI Pt here for follow up and management of chronic medical problems which includes hypothyroid, hypertension, and hyperlipidemia. He is taking medications regularly. The patient continues to be followed regularly for his thyroid cancer. Patient continues to do well otherwise and does have occasional flares with his back pain which is osteoarthritic related and he has seen a neurosurgeon about this. He is not having any surgery at this time. 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 He denies chest pain shortness of breath and trouble swallowing heartburn indigestion and nausea vomiting diarrhea or blood in the stool. He also has had a recent biopsy of his prostate which was negative for cancer and the urologist does not plan to see him again for a year which would be November 2017. He is still recovering from this biopsy which was done in early November. He follows up with the endocrinologist in August 2017. He refuses to take a flu shot because he says it makes him sick.       Patient Active Problem List   Diagnosis Date Noted  . Pre-diabetes 06/25/2015  . Thrombocytopenia ([place]) 02/04/2015  . History of thyroid cancer 09/04/2014  . Hyperlipemia 09/04/2014  . Thyroid activity decreased 09/04/2014  . Osteoarthritis of spine with radiculopathy, lumbar region 09/04/2014  . Osteoarthritis of lumbar spine 05/12/2014  . Vitamin D deficiency 05/12/2014  . Metabolic syndrome 00/34/9179  . Elevated PSA 01/22/2013  . Essential hypertension, benign 01/06/2013  . History of thyroid cancer  01/06/2013  . Hyperlipidemia 01/06/2013   Outpatient Encounter Prescriptions as of 10/13/2015  Medication Sig  . aspirin 81 MG tablet Take 81 mg by mouth daily.  Marland Kitchen BENICAR 40 MG tablet TAKE ONE  TABLET BY MOUTH ONE TIME DAILY  . Cholecalciferol (VITAMIN D-3) 5000 UNITS TABS Take 1 tablet by mouth daily.  . CRESTOR 40 MG tablet TAKE ONE TABLET BY MOUTH ONE TIME DAILY  . HYDROcodone-acetaminophen (NORCO) 10-325 MG tablet Take 1 tablet by mouth every 6 (six) hours as needed for severe pain.  Marland Kitchen levothyroxine (SYNTHROID, LEVOTHROID) 112 MCG tablet Take 112 mcg by mouth daily.   No facility-administered encounter medications on file as of 10/13/2015.      Review of Systems  Constitutional: Negative.   HENT: Negative.   Eyes: Negative.   Respiratory: Negative.   Cardiovascular: Negative.   Gastrointestinal: Negative.   Endocrine: Negative.   Genitourinary: Negative.   Musculoskeletal: Negative.   Skin: Negative.   Allergic/Immunologic: Negative.   Neurological: Negative.   Hematological: Negative.   Psychiatric/Behavioral: Negative.        Objective:   Physical Exam  Constitutional: He is oriented to person, place, and time. He appears well-developed and well-nourished. No distress.  HENT:  Head: Normocephalic and atraumatic.  Right Ear: External ear normal.  Left Ear: External ear normal.  Mouth/Throat: Oropharynx is clear and moist. No oropharyngeal exudate.  Nasal congestion bilaterally  Eyes: Conjunctivae and EOM are normal. Pupils are equal, round, and reactive to light. Right eye exhibits no discharge. Left eye exhibits no discharge. No scleral icterus.  Neck: Normal range of motion. Neck supple. No thyromegaly present.  Without thyromegaly bruits or adenopathy  Cardiovascular: Normal rate, regular rhythm, normal heart sounds and intact  distal pulses.   No murmur heard. At 72/m  Pulmonary/Chest: Effort normal and breath sounds normal. No respiratory distress. He has no wheezes. He has no rales. He exhibits no tenderness.  Clear anteriorly and posteriorly  Abdominal: Soft. Bowel sounds are normal. He exhibits no mass. There is no tenderness. There is no rebound and  no guarding.  Mild obesity without masses tenderness or organ enlargement or inguinal adenopathy and no bruits.  Genitourinary:  Urology regularly in November  Musculoskeletal: Normal range of motion. He exhibits no edema.  Lymphadenopathy:    He has no cervical adenopathy.  Neurological: He is alert and oriented to person, place, and time. He has normal reflexes. No cranial nerve deficit.  Skin: Skin is warm and dry. No rash noted.  Psychiatric: He has a normal mood and affect. His behavior is normal. Judgment and thought content normal.  Nursing note and vitals reviewed.  BP 110/77 mmHg  Pulse 68  Temp(Src) 97.2 F (36.2 C) (Oral)  Ht 6' (1.829 m)  Wt 211 lb (95.709 kg)  BMI 28.61 kg/m2        Assessment & Plan:  1. Hyperlipemia -Continue with current treatment pending results of lab work - CBC with Differential/Platelet; Future - NMR, lipoprofile; Future  2. Vitamin D deficiency -Continue with vitamin D replacement pending results of lab work - CBC with Differential/Platelet; Future - VITAMIN D 25 Hydroxy (Vit-D Deficiency, Fractures); Future  3. Essential hypertension, benign -The blood pressure is good today and he will continue with current treatment and sodium restriction - BMP8+EGFR; Future - CBC with Differential/Platelet; Future - Hepatic function panel; Future - DG Chest 2 View; Future  4. Hypothyroidism, unspecified hypothyroidism type -He sees the endocrinologist regularly and he will continue with current viral replacement pending results of lab work at the next visit. -He has a history of thyroid cancer and needs overtreatment to keep the thyroid suppressed. - CBC with Differential/Platelet; Future  5. Thrombocytopenia ([facility]) -No evidence of any bleeding problems - CBC with Differential/Platelet; Future  6. Elevated PSA -Continue follow-up with urology - CBC with Differential/Platelet; Future  7. Thyroid cancer ([place]) -Continue to follow-up with  endocrinology  8. Osteoarthritis of spine with radiculopathy, lumbar region -Continue hydrocodone as needed and as little as needed  Meds ordered this encounter  Medications  . HYDROcodone-acetaminophen (NORCO) 10-325 MG tablet    Sig: Take 1 tablet by mouth every 6 (six) hours as needed for severe pain.    Dispense:  60 tablet    Refill:  0   Patient Instructions                       Medicare Annual Wellness Visit  [facility] and the medical providers at [facility] strive to bring you the best medical care.  In doing so we not only want to address your current medical conditions and concerns but also to detect new conditions early and prevent illness, disease and health-related problems.    Medicare offers a yearly Wellness Visit which allows our clinical staff to assess your need for preventative services including immunizations, lifestyle education, counseling to decrease risk of preventable diseases and screening for fall risk and other medical concerns.    This visit is provided free of charge (no copay) for all Medicare recipients. The clinical pharmacists at [facility] have begun to conduct these Wellness Visits which will also include a thorough review of all your medications.  As you primary medical provider recommend that you make an appointment for your Annual Wellness Visit if you have not done so already this year.  You may set up this appointment before you leave today or you may call back [phone]) and schedule an appointment.  Please make sure when you call that you mention that you are scheduling your Annual Wellness Visit with the clinical pharmacist so that the appointment may be made for the proper length of time.     Continue current medications. Continue good therapeutic lifestyle changes which include good diet and exercise. Fall precautions discussed with patient. If an FOBT was given today- please return it to  our front desk. If you are over 29 years old - you may need Prevnar 9 or the adult Pneumonia vaccine.  **Flu shots are available--- please call and schedule a FLU-CLINIC appointment**  After your visit with [place] today you will receive a survey in the mail or online from [employer] regarding your care with [place]. Please take a moment to fill this out. Your feedback is very important to [place] as you can help [place] better understand your patient needs as well as improve your experience and satisfaction. WE CARE ABOUT YOU!!!   Continue to follow-up with endocrinology Continue to follow-up with urology This winter drink plenty of fluids and stay well hydrated Take as little pain medicine as possible for the back   Arrie Senate MD

## 2015-10-13 NOTE — Patient Instructions (Addendum)
Medicare Annual Wellness Visit  [facility] and the medical providers at [facility] strive to bring you the best medical care.  In doing so we not only want to address your current medical conditions and concerns but also to detect new conditions early and prevent illness, disease and health-related problems.    Medicare offers a yearly Wellness Visit which allows our clinical staff to assess your need for preventative services including immunizations, lifestyle education, counseling to decrease risk of preventable diseases and screening for fall risk and other medical concerns.    This visit is provided free of charge (no copay) for all Medicare recipients. The clinical pharmacists at [facility] have begun to conduct these Wellness Visits which will also include a thorough review of all your medications.    As you primary medical provider recommend that you make an appointment for your Annual Wellness Visit if you have not done so already this year.  You may set up this appointment before you leave today or you may call back [phone] and schedule an appointment.  Please make sure when you call that you mention that you are scheduling your Annual Wellness Visit with the clinical pharmacist so that the appointment may be made for the proper length of time.     Continue current medications. Continue good therapeutic lifestyle changes which include good diet and exercise. Fall precautions discussed with patient. If an FOBT was given today- please return it to our front desk. If you are over 23 years old - you may need Prevnar 54 or the adult Pneumonia vaccine.  **Flu shots are available--- please call and schedule a FLU-CLINIC appointment**  After your visit with [place] today you will receive a survey in the mail or online from [employer] regarding your care with [place]. Please take a moment to fill this out. Your feedback is very  important to [place] as you can help [place] better understand your patient needs as well as improve your experience and satisfaction. WE CARE ABOUT YOU!!!   Continue to follow-up with endocrinology Continue to follow-up with urology This winter drink plenty of fluids and stay well hydrated Take as little pain medicine as possible for the back

## 2015-10-16 ENCOUNTER — Other Ambulatory Visit: Payer: Medicare Other

## 2015-10-16 DIAGNOSIS — E559 Vitamin D deficiency, unspecified: Secondary | ICD-10-CM | POA: Diagnosis not present

## 2015-10-16 DIAGNOSIS — I10 Essential (primary) hypertension: Secondary | ICD-10-CM

## 2015-10-16 DIAGNOSIS — E039 Hypothyroidism, unspecified: Secondary | ICD-10-CM | POA: Diagnosis not present

## 2015-10-16 DIAGNOSIS — R972 Elevated prostate specific antigen [PSA]: Secondary | ICD-10-CM | POA: Diagnosis not present

## 2015-10-16 DIAGNOSIS — D696 Thrombocytopenia, unspecified: Secondary | ICD-10-CM

## 2015-10-16 DIAGNOSIS — E785 Hyperlipidemia, unspecified: Secondary | ICD-10-CM | POA: Diagnosis not present

## 2015-10-18 LAB — CBC WITH DIFFERENTIAL/PLATELET
BASOS ABS: 0 10*3/uL (ref 0.0–0.2)
Basos: 1 %
EOS (ABSOLUTE): 0.1 10*3/uL (ref 0.0–0.4)
Eos: 2 %
HEMOGLOBIN: 14.3 g/dL (ref 12.6–17.7)
Hematocrit: 41.6 % (ref 37.5–51.0)
Immature Grans (Abs): 0 10*3/uL (ref 0.0–0.1)
Immature Granulocytes: 0 %
LYMPHS ABS: 1.6 10*3/uL (ref 0.7–3.1)
Lymphs: 30 %
MCH: 32.5 pg (ref 26.6–33.0)
MCHC: 34.4 g/dL (ref 31.5–35.7)
MCV: 95 fL (ref 79–97)
MONOCYTES: 8 %
MONOS ABS: 0.5 10*3/uL (ref 0.1–0.9)
NEUTROS ABS: 3.2 10*3/uL (ref 1.4–7.0)
Neutrophils: 59 %
Platelets: 155 10*3/uL (ref 150–379)
RBC: 4.4 x10E6/uL (ref 4.14–5.80)
RDW: 13.3 % (ref 12.3–15.4)
WBC: 5.4 10*3/uL (ref 3.4–10.8)

## 2015-10-18 LAB — VITAMIN D 25 HYDROXY (VIT D DEFICIENCY, FRACTURES): VIT D 25 HYDROXY: 52.4 ng/mL (ref 30.0–100.0)

## 2015-10-18 LAB — BMP8+EGFR
BUN / CREAT RATIO: 17 (ref 10–22)
BUN: 16 mg/dL (ref 8–27)
CHLORIDE: 106 mmol/L (ref 96–106)
CO2: 20 mmol/L (ref 18–29)
Calcium: 8.8 mg/dL (ref 8.6–10.2)
Creatinine, Ser: 0.94 mg/dL (ref 0.76–1.27)
GFR calc non Af Amer: 84 mL/min/{1.73_m2} (ref >59)
GFR, EST AFRICAN AMERICAN: 97 mL/min/{1.73_m2} (ref >59)
GLUCOSE: 123 mg/dL — AB (ref 65–99)
POTASSIUM: 4.3 mmol/L (ref 3.5–5.2)
Sodium: 142 mmol/L (ref 134–144)

## 2015-10-18 LAB — HEPATIC FUNCTION PANEL
ALK PHOS: 60 IU/L (ref 39–117)
ALT: 24 IU/L (ref 0–44)
AST: 17 IU/L (ref 0–40)
Albumin: 4.1 g/dL (ref 3.6–4.8)
Bilirubin Total: 0.9 mg/dL (ref 0.0–1.2)
Bilirubin, Direct: 0.3 mg/dL (ref 0.00–0.40)
TOTAL PROTEIN: 5.8 g/dL — AB (ref 6.0–8.5)

## 2015-10-18 LAB — NMR, LIPOPROFILE
CHOLESTEROL: 97 mg/dL — AB (ref 100–199)
HDL Cholesterol by NMR: 37 mg/dL — ABNORMAL LOW (ref >39)
HDL Particle Number: 27.5 umol/L — ABNORMAL LOW (ref >=30.5)
LDL PARTICLE NUMBER: 718 nmol/L (ref <1000)
LDL Size: 20.2 nm (ref >20.5)
LDL-C: 49 mg/dL (ref 0–99)
LP-IR SCORE: 43 (ref <=45)
Small LDL Particle Number: 431 nmol/L (ref <=527)
Triglycerides by NMR: 54 mg/dL (ref 0–149)

## 2015-10-18 NOTE — Addendum Note (Signed)
Addended by: Pollyann Kennedy F on: 10/18/2015 03:29 PM   Modules accepted: Orders

## 2015-10-29 ENCOUNTER — Other Ambulatory Visit (INDEPENDENT_AMBULATORY_CARE_PROVIDER_SITE_OTHER): Payer: Medicare Other

## 2015-10-29 DIAGNOSIS — R799 Abnormal finding of blood chemistry, unspecified: Secondary | ICD-10-CM

## 2015-10-29 LAB — POCT GLYCOSYLATED HEMOGLOBIN (HGB A1C): Hemoglobin A1C: 6.3

## 2015-11-05 ENCOUNTER — Encounter: Payer: Self-pay | Admitting: Pharmacist

## 2015-11-05 ENCOUNTER — Ambulatory Visit (INDEPENDENT_AMBULATORY_CARE_PROVIDER_SITE_OTHER): Payer: Medicare Other | Admitting: Pharmacist

## 2015-11-05 VITALS — BP 112/74 | HR 70 | Ht 72.0 in | Wt 213.0 lb

## 2015-11-05 DIAGNOSIS — R7303 Prediabetes: Secondary | ICD-10-CM | POA: Diagnosis not present

## 2015-11-05 MED ORDER — METFORMIN HCL ER 500 MG PO TB24: 500.0000 mg | ORAL_TABLET | Freq: Every day | ORAL | Status: DC

## 2015-11-05 NOTE — Patient Instructions (Signed)
Prediabetes Many people have heard about type 2 diabetes, but its common precursor, prediabetes, doesn't get as much attention. Prediabetes is estimated by CDC to affect 86 million Americans (this includes 51% of people 65 years and older), and an estimated 90% of people with prediabetes don't even know it. According to the CDC, 15-30% of these individuals will develop type 2 diabetes within five years. In other words, as many as 26 million people that currently have prediabetes could develop type 2 diabetes by 2020, effectively doubling the number of people with type 2 diabetes in the [place].  What is prediabetes? Prediabetes is a condition where blood sugar levels are higher than normal, but not high enough to be diagnosed as type 2 diabetes. This occurs when the body has problems in processing glucose properly, and sugar starts to build up in the bloodstream instead of fueling cells in muscles and tissues. Insulin is the hormone that tells cells to take up glucose, and in prediabetes, people typically initially develop insulin resistance (where the body's cells can't respond to insulin as well), and over time (if no actions are taken to reverse the situation) the ability to produce sufficient insulin is reduced. People with prediabetes also commonly have high blood pressure as well as abnormal blood lipids (e.g. cholesterol). These often occur prior to the rise of blood glucose levels.  What are the symptoms of prediabetes? People typically do not have symptoms of prediabetes, which is partially why up to 90% of people don't know they have it. The ADA reports that some people with prediabetes may develop symptoms of type 2 diabetes, though even many people diagnosed with type 2 diabetes show little or no symptoms initially at diagnosis.  How is prediabetes diagnosed? According to the American Diabetes Association, prediabetes can be diagnosed through one of the following tests: 1. A glycated hemoglobin  test, also known as HbA1c or simply A1c, gives an idea of the body's average blood sugar levels from the past two or three months. It is usually done with a small drop of blood from a fingerstick or as part of having blood taken in a doctor's office, hospital, or laboratory. A1c Level Diagnosis  Less than 5.7% Normal  5.7% to 6.4% Prediabetes  6.5% and higher Diabetes  2. A fasting plasma glucose (FPG) test measures a person's blood glucose level after fasting (not eating) for eight hours - this is typically done in the morning. If a test shows positive for prediabetes, a second test should be taken on a different day to confirm the diagnosis. FPG Level Diagnosis  Less than 100 mg/dl Normal  100 mg/dl to 125 mg/dl Prediabetes  126 mg/dl and higher Diabetes   Who is at risk of developing prediabetes? A well-known paper published in the Lancet in 2010 recommends screening for type 2 diabetes (which would also screen for prediabetes) every 3-5 years in all adults over the age of 45, regardless of other risk factors. Overweight and obese adults (a BMI >25 kg/m2) are also at significantly greater risk for developing prediabetes, as well as people with a family history of type 2 diabetes. According to the CDC, several other factors can have moderate influences on prediabetes risk in addition to age, weight, and family history: People with an African American, Hispanic/Latino, American Indian, Asian American, or Pacific Islander racial or ethnic background. The 2015 ADA Standards of Medical Care recommendations suggest Asian Americans with a BMI of 23 or above be screened for type 2 diabetes.    Women with a history of diabetes during pregnancy ("gestational diabetes") or have given birth to a baby weighing nine pounds or more. People who are physically active fewer than three times a week. The CDC offers a fast, online screening test for evaluating the risk for prediabetes. The ADA also offers a screening  test to assess type 2 diabetes risk. Of course, these tests do not themselves confirm a prediabetes diagnosis, but just if someone may be at higher risk of developing it.  Why do people develop prediabetes? Prediabetes develops through a combination of factors that are still being investigated. For sure, lifestyle factors (food, exercise, stress, sleep) play a role, but family history and genetics certainly do as well. It is easy to assume that prediabetes is the result of being overweight, but the relationship is not that simple. While obesity is one underlying cause of insulin resistance, many overweight individuals may never develop prediabetes or type 2 diabetes, and a minority of people with prediabetes have never been overweight. To make matters worse, it can be increasingly difficult to make healthy choices in today's toxic food environment that steers all of us to make the wrong food choices, and there are many factors that can contribute to weight gain in addition to diet.  Is a prediabetes diagnosis serious? There has been significant debate around the term 'prediabetes,' and whether it should be considered cause for alarm. On the one hand, it serves as a risk factor for type 2 diabetes and a host of other complications, including heart disease, and ultimately prediabetes implies that a degree of metabolic problems have started to occur in the body. On the other hand, it places a diagnosis on many people who may never develop type 2 diabetes. Again, according to the CDC, 15-30% of those with prediabetes will develop type 2 diabetes within five years. However, a 2012 Lancet article cites 5-10% of those with prediabetes each year will also revert back to healthy blood sugars. What's critical is not necessarily the cutoff itself, but where someone falls within the ranges listed above. The level of risk of developing type 2 diabetes is closely related to A1c or FPG at diagnosis. Those in the higher ranges  (A1c closer to 6.4%, FPG closer to 125 mg/dl) are much more likely to progress to type 2 diabetes, whereas those at lower ranges (A1c closer to 5.7%, FPG closer to 100 mg/dl) are relatively more likely to revert back to normal glucose levels or stay within the prediabetes range. Age of diagnosis and the level of insulin production still occurring at diagnosis also impact the chances of reverting to normoglycemia (normal blood sugar levels).  What can people with prediabetes do to avoid the progression from prediabetes to type 2 diabetes? The most important action people diagnosed with prediabetes can take is to focus on living a healthy lifestyle. This includes making healthy food choices, controlling portions, and increasing physical activity. Regarding weight control, research shows losing 5-7% (often about 10-20 lbs.) from your initial body weight and keeping off as much of that weight over time as possible is critical to lowering the risk of type 2 diabetes. This task is of course easier said than done, but sustained weight loss over time can be key to improving health and delaying or preventing the onset of type 2 diabetes. Several prediabetes interventions exist based on evidence from the landmark Diabetes Prevention Program (DPP) study. The DPP study reported that moderate weight loss (5-7% of body weight, or ~10-15 lbs.   for someone weighing 200 lbs.), counseling, and education on healthy eating and behavior reduced the risk of developing type 2 diabetes by 58%. Data presented at the ADA 2014 conference showed that after 15 years of follow-up of the DPP study groups, the results were still encouraging: 27% of those in the original lifestyle group had a significant reduction in type 2 diabetes progression compared to the control group. If you or someone you know has been told they have prediabetes, here are a few helpful resources: In-person diabetes prevention programs: The CDC offers a one year long  lifestyle change program through its National Diabetes Prevention Program (NDPP) at various locations throughout the [place] to help participants adopt healthy habits and prevent or delay progression to type 2 diabetes. This program is a major undertaking by the CDC to translate the findings from the DPP study into a real world setting, a significant effort indeed! Online diabetes prevention programs: The CDC has now given pending recognition status to three digital prevention programs: [facility] [facility], [facility], and [facility]. These offer the same one year long educational curriculum as the DPP study, but in an online format. Some insurance companies and employers cover these programs, and you can find more information at the links above. These digital versions are excellent options for those who live far away from NDPP locations or who prefer the anonymity and convenience of doing the program online. Metformin: The DPP study found that metformin, the safest first-line therapy for type 2 diabetes, may help delay the onset of type 2 diabetes in people with prediabetes. Participants who took the low-cost generic drug had a 31% reduced risk of developing type 2 diabetes compared to the control group (those not on metformin or intensive lifestyle intervention). Again, 15-year follow up data showed that 17% of those on metformin continued to have a significant reduction in type 2 progression. At this time, metformin (or any other medication, for that matter) is not currently FDA approved for prediabetes, and it is sometimes prescribed "off-label" by a healthcare provider. Your healthcare provider can give you more information and determine whether metformin is a good option for you.  Can prediabetes be "cured"? In the early stages of prediabetes (and type 2 diabetes), diligent attention to food choices and activity, and most importantly weight loss, can improve blood sugar numbers, effectively "reversing" the disease  and reducing the odds of developing type 2 diabetes. However, some people may have underlying factors (such as family history and genetics) that put them at a greater risk of type 2 diabetes, meaning they will always require careful attention to blood sugar levels and lifestyle choices. Returning to old habits will likely put someone back on the road to prediabetes, and eventually, type 2 diabetes   

## 2015-11-05 NOTE — Progress Notes (Signed)
CC:  Pre diabetes  HPI: Patient referred by Dr Laurance Flatten for education and to start metformin related to pre diabetes. Bradley Wong was diagnosed with pre diabetes 05/2015.  I met with patient and disused dietary changes to help decrease BG.   He has a significant family history of diabetes - maternal grandmother and 2 brothers have type 2 DM.   Patient 's A1c was 6.4% (05/2015) and when checked 09/2015 was down slightly to 6.3%  Diet - patient has stopped Gatorade and other sugar containing beverages.  He is limiting serving sizes of high CHO foods though he admits that about 1 month prior to labs he was eating out fast food quite a bit and having french fries about every other day.  He has stopped this  Exercise - none - limited by knee and back pain (has seen neurosurgeon regarding back pain)  H/o thyroid cancer - on levothyroxine.   Assessment:  Pre diabetes- with improved A1c  Obesity - goal of 10% weight loss  Plan:  Reviewed CHO counting diet.  Patient is to limit portion sizes of high CHO containing foods.  Recommended increase vegetables and try to get at least 3  servings a day. Continue with no sugar containing beverages such as juices or gatroade Start metformin XR 556m 1 tablet qam with food. Recommended start regular exercise -   Start with 5 or 10 minutes and increase as able to goal of 30 minutes at least 5 days per week. Discussed trying stationary bike that he just bought. RTC in 3 months to recheck labs or as needed.   TCherre Robins PharmD, CPP, CDE

## 2016-03-06 ENCOUNTER — Encounter (INDEPENDENT_AMBULATORY_CARE_PROVIDER_SITE_OTHER): Payer: Self-pay

## 2016-03-06 ENCOUNTER — Ambulatory Visit (INDEPENDENT_AMBULATORY_CARE_PROVIDER_SITE_OTHER): Payer: Medicare Other | Admitting: Family Medicine

## 2016-03-06 ENCOUNTER — Encounter: Payer: Self-pay | Admitting: Family Medicine

## 2016-03-06 VITALS — BP 115/73 | HR 61 | Temp 97.0°F | Ht 72.0 in | Wt 206.0 lb

## 2016-03-06 DIAGNOSIS — R7303 Prediabetes: Secondary | ICD-10-CM | POA: Diagnosis not present

## 2016-03-06 DIAGNOSIS — C73 Malignant neoplasm of thyroid gland: Secondary | ICD-10-CM

## 2016-03-06 DIAGNOSIS — E559 Vitamin D deficiency, unspecified: Secondary | ICD-10-CM | POA: Diagnosis not present

## 2016-03-06 DIAGNOSIS — E785 Hyperlipidemia, unspecified: Secondary | ICD-10-CM | POA: Diagnosis not present

## 2016-03-06 DIAGNOSIS — D696 Thrombocytopenia, unspecified: Secondary | ICD-10-CM | POA: Diagnosis not present

## 2016-03-06 DIAGNOSIS — E039 Hypothyroidism, unspecified: Secondary | ICD-10-CM

## 2016-03-06 DIAGNOSIS — M4726 Other spondylosis with radiculopathy, lumbar region: Secondary | ICD-10-CM | POA: Diagnosis not present

## 2016-03-06 DIAGNOSIS — I10 Essential (primary) hypertension: Secondary | ICD-10-CM | POA: Diagnosis not present

## 2016-03-06 MED ORDER — HYDROCODONE-ACETAMINOPHEN 10-325 MG PO TABS: 1.0000 | ORAL_TABLET | Freq: Four times a day (QID) | ORAL | Status: DC | PRN

## 2016-03-06 NOTE — Patient Instructions (Addendum)
Medicare Annual Wellness Visit  [facility] and the medical providers at [facility] strive to bring you the best medical care.  In doing so we not only want to address your current medical conditions and concerns but also to detect new conditions early and prevent illness, disease and health-related problems.    Medicare offers a yearly Wellness Visit which allows our clinical staff to assess your need for preventative services including immunizations, lifestyle education, counseling to decrease risk of preventable diseases and screening for fall risk and other medical concerns.    This visit is provided free of charge (no copay) for all Medicare recipients. The clinical pharmacists at [facility] have begun to conduct these Wellness Visits which will also include a thorough review of all your medications.    As you primary medical provider recommend that you make an appointment for your Annual Wellness Visit if you have not done so already this year.  You may set up this appointment before you leave today or you may call back [phone] and schedule an appointment.  Please make sure when you call that you mention that you are scheduling your Annual Wellness Visit with the clinical pharmacist so that the appointment may be made for the proper length of time.     Continue current medications. Continue good therapeutic lifestyle changes which include good diet and exercise. Fall precautions discussed with patient. If an FOBT was given today- please return it to our front desk. If you are over 62 years old - you may need Prevnar 13 or the adult Pneumonia vaccine.  **Flu shots are available--- please call and schedule a FLU-CLINIC appointment**  After your visit with [place] today you will receive a survey in the mail or online from [employer] regarding your care with [place]. Please take a moment to fill this out. Your feedback is very  important to [place] as you can help [place] better understand your patient needs as well as improve your experience and satisfaction. WE CARE ABOUT YOU!!!   Please continue to not put yourself at risk for injury and avoid the climbing and other activities that can aggravate your back pain Continue to work on the diet as you have been doing and I'm proud that you have lost the weight by watching your intake so much more closely This summer and drink plenty of fluids and stay well hydrated

## 2016-03-06 NOTE — Progress Notes (Signed)
Subjective:    Patient ID: Bradley Wong, male    DOB: [birth year], 70 y.o.   MRN: [id number]  HPI Pt here for follow up and management of chronic medical problems which includes hyperlipidemia, hypertension and hypothyroid. He is taking medications regularly.The patient is doing well overall. He has ongoing chronic back pain secondary to osteoarthritis. He has seen the neurosurgeon about this in the past. He also has history of thyroid cancer and is followed regularly by the endocrinologist at [facility]. He is not fasting today and will return to the office fasting for his blood work. His weight is down 7 pounds since last visit. Patient denies any chest pain pressure or tightness. He has no shortness of breath anymore than usual. He denies any problems with swallowing heartburn indigestion nausea vomiting diarrhea or blood in the stool. He does continue to see the urologist regularly and has an appointment coming up this summer. He also sees the endocrinologist regularly because of the thyroid cancer. He has had several prostate biopsies the last over a year ago and everything remains benign. He is an appointment with the ophthalmologist this year sometime. He has lost the weight through efforts at watching his diet.    Patient Active Problem List   Diagnosis Date Noted  . Pre-diabetes 06/25/2015  . Thrombocytopenia ([place]) 02/04/2015  . Thyroid cancer ([place]) 09/04/2014  . Hyperlipemia 09/04/2014  . Thyroid activity decreased 09/04/2014  . Osteoarthritis of spine with radiculopathy, lumbar region 09/04/2014  . Osteoarthritis of lumbar spine 05/12/2014  . Vitamin D deficiency 05/12/2014  . Metabolic syndrome 72/06/4708  . Elevated PSA 01/22/2013  . Essential hypertension, benign 01/06/2013   Outpatient Encounter Prescriptions as of 03/06/2016  Medication Sig  . aspirin 81 MG tablet Take 81 mg by mouth daily.  Marland Kitchen BENICAR 40 MG tablet TAKE ONE TABLET BY MOUTH ONE TIME  DAILY (Patient taking differently: TAKE ONE-HALF TABLET BY MOUTH ONE TIME DAILY)  . Cholecalciferol (VITAMIN D-3) 5000 UNITS TABS Take 1 tablet by mouth daily.  . CRESTOR 40 MG tablet TAKE ONE TABLET BY MOUTH ONE TIME DAILY (Patient taking differently: TAKE ONE-HALF TABLET BY MOUTH ONE TIME DAILY)  . HYDROcodone-acetaminophen (NORCO) 10-325 MG tablet Take 1 tablet by mouth every 6 (six) hours as needed for severe pain.  Marland Kitchen levothyroxine (SYNTHROID, LEVOTHROID) 112 MCG tablet Take 112 mcg by mouth daily.  . metFORMIN (GLUCOPHAGE XR) 500 MG 24 hr tablet Take 1 tablet (500 mg total) by mouth daily with breakfast.  . [DISCONTINUED] HYDROcodone-acetaminophen (NORCO) 10-325 MG tablet Take 1 tablet by mouth every 6 (six) hours as needed for severe pain.   No facility-administered encounter medications on file as of 03/06/2016.      Review of Systems  Constitutional: Negative.   HENT: Negative.   Eyes: Negative.   Respiratory: Negative.   Cardiovascular: Negative.   Gastrointestinal: Negative.   Endocrine: Negative.   Genitourinary: Negative.   Musculoskeletal: Negative.   Skin: Negative.   Allergic/Immunologic: Negative.   Neurological: Negative.   Hematological: Negative.   Psychiatric/Behavioral: Negative.        Objective:   Physical Exam  Constitutional: He is oriented to person, place, and time. He appears well-developed and well-nourished. No distress.  HENT:  Head: Normocephalic and atraumatic.  Right Ear: External ear normal.  Left Ear: External ear normal.  Mouth/Throat: Oropharynx is clear and moist. No oropharyngeal exudate.  Nasal congestion bilaterally  Eyes: Conjunctivae and EOM are normal. Pupils are equal, round,  and reactive to light. Right eye exhibits no discharge. Left eye exhibits no discharge. No scleral icterus.  Neck: Normal range of motion. Neck supple. No tracheal deviation present. No thyromegaly present.  No bruits or thyromegaly or masses.  Cardiovascular:  Normal rate, regular rhythm, normal heart sounds and intact distal pulses.   No murmur heard. At 72/m  Pulmonary/Chest: Effort normal and breath sounds normal. No respiratory distress. He has no wheezes. He has no rales. He exhibits no tenderness.  Clear anteriorly and posteriorly and no axillary adenopathy  Abdominal: Soft. Bowel sounds are normal. He exhibits no mass. There is no tenderness. There is no rebound and no guarding.  No liver or spleen enlargement no epigastric tenderness and no inguinal adenopathy. No masses.  Genitourinary:  This exam is done regularly by the urologist.  Musculoskeletal: Normal range of motion. He exhibits no edema or tenderness.  Reflexes are reduced bilaterally in the lower extremities and there is good leg raising today with minimal pain.  Lymphadenopathy:    He has no cervical adenopathy.  Neurological: He is alert and oriented to person, place, and time. He has normal reflexes. No cranial nerve deficit.  Skin: Skin is warm and dry. No rash noted.  Psychiatric: He has a normal mood and affect. His behavior is normal. Judgment and thought content normal.  Nursing note and vitals reviewed.  BP 115/73 mmHg  Pulse 61  Temp(Src) 97 F (36.1 C) (Oral)  Ht 6' (1.829 m)  Wt 206 lb (93.441 kg)  BMI 27.93 kg/m2        Assessment & Plan:  1. Hypothyroidism, unspecified hypothyroidism type -Continue to follow-up with endocrinology - CBC with Differential/Platelet; Future - Thyroid Panel With TSH; Future  2. Thrombocytopenia ([place]) -The patient describes no bleeding issues with GI or urology. - CBC with Differential/Platelet; Future  3. Essential hypertension, benign -The blood pressure is good today he should continue with current treatment and weight loss - BMP8+EGFR; Future - CBC with Differential/Platelet; Future - Hepatic function panel; Future  4. Hyperlipemia -Continue with treatment and aggressive therapeutic lifestyle changes - CBC with  Differential/Platelet; Future - NMR, lipoprofile; Future  5. Vitamin D deficiency -Continue current treatment pending results of lab work - CBC with Differential/Platelet; Future - VITAMIN D 25 Hydroxy (Vit-D Deficiency, Fractures); Future  6. Prediabetes -Continue with aggressive therapeutic lifestyle changes - BMP8+EGFR; Future - CBC with Differential/Platelet; Future  7. Thyroid cancer ([place]) -Continue to follow-up with endocrinology, Dr. Braulio Conte at [facility] with Differential/Platelet; Future - Thyroid Panel With TSH; Future  8. Osteoarthritis of spine with radiculopathy, lumbar region -Continue to avoid aggravating circumstances and follow-up with neurosurgery if pain continues.  Meds ordered this encounter  Medications  . HYDROcodone-acetaminophen (NORCO) 10-325 MG tablet    Sig: Take 1 tablet by mouth every 6 (six) hours as needed for severe pain.    Dispense:  60 tablet    Refill:  0   Patient Instructions                       Medicare Annual Wellness Visit  [facility] and the medical providers at [facility] strive to bring you the best medical care.  In doing so we not only want to address your current medical conditions and concerns but also to detect new conditions early and prevent illness, disease and health-related problems.    Medicare offers a yearly Wellness Visit which allows our clinical staff to  assess your need for preventative services including immunizations, lifestyle education, counseling to decrease risk of preventable diseases and screening for fall risk and other medical concerns.    This visit is provided free of charge (no copay) for all Medicare recipients. The clinical pharmacists at [facility] have begun to conduct these Wellness Visits which will also include a thorough review of all your medications.    As you primary medical provider recommend that you make an appointment for your  Annual Wellness Visit if you have not done so already this year.  You may set up this appointment before you leave today or you may call back [phone]) and schedule an appointment.  Please make sure when you call that you mention that you are scheduling your Annual Wellness Visit with the clinical pharmacist so that the appointment may be made for the proper length of time.     Continue current medications. Continue good therapeutic lifestyle changes which include good diet and exercise. Fall precautions discussed with patient. If an FOBT was given today- please return it to our front desk. If you are over 34 years old - you may need Prevnar 18 or the adult Pneumonia vaccine.  **Flu shots are available--- please call and schedule a FLU-CLINIC appointment**  After your visit with [place] today you will receive a survey in the mail or online from [employer] regarding your care with [place]. Please take a moment to fill this out. Your feedback is very important to [place] as you can help [place] better understand your patient needs as well as improve your experience and satisfaction. WE CARE ABOUT YOU!!!   Please continue to not put yourself at risk for injury and avoid the climbing and other activities that can aggravate your back pain Continue to work on the diet as you have been doing and I'm proud that you have lost the weight by watching your intake so much more closely This summer and drink plenty of fluids and stay well hydrated   Arrie Senate MD

## 2016-03-11 ENCOUNTER — Other Ambulatory Visit: Payer: Medicare Other

## 2016-03-11 DIAGNOSIS — R7303 Prediabetes: Secondary | ICD-10-CM | POA: Diagnosis not present

## 2016-03-11 DIAGNOSIS — I10 Essential (primary) hypertension: Secondary | ICD-10-CM | POA: Diagnosis not present

## 2016-03-11 DIAGNOSIS — E039 Hypothyroidism, unspecified: Secondary | ICD-10-CM

## 2016-03-11 DIAGNOSIS — D696 Thrombocytopenia, unspecified: Secondary | ICD-10-CM

## 2016-03-11 DIAGNOSIS — E785 Hyperlipidemia, unspecified: Secondary | ICD-10-CM | POA: Diagnosis not present

## 2016-03-11 DIAGNOSIS — E559 Vitamin D deficiency, unspecified: Secondary | ICD-10-CM | POA: Diagnosis not present

## 2016-03-11 DIAGNOSIS — C73 Malignant neoplasm of thyroid gland: Secondary | ICD-10-CM

## 2016-03-12 LAB — HEPATIC FUNCTION PANEL
ALBUMIN: 4 g/dL (ref 3.6–4.8)
ALK PHOS: 53 IU/L (ref 39–117)
ALT: 23 IU/L (ref 0–44)
AST: 16 IU/L (ref 0–40)
BILIRUBIN, DIRECT: 0.17 mg/dL (ref 0.00–0.40)
Bilirubin Total: 0.5 mg/dL (ref 0.0–1.2)
TOTAL PROTEIN: 5.8 g/dL — AB (ref 6.0–8.5)

## 2016-03-12 LAB — BMP8+EGFR
BUN/Creatinine Ratio: 17 (ref 10–24)
BUN: 16 mg/dL (ref 8–27)
CALCIUM: 8.8 mg/dL (ref 8.6–10.2)
CHLORIDE: 107 mmol/L — AB (ref 96–106)
CO2: 21 mmol/L (ref 18–29)
CREATININE: 0.96 mg/dL (ref 0.76–1.27)
GFR, EST AFRICAN AMERICAN: 94 mL/min/{1.73_m2} (ref >59)
GFR, EST NON AFRICAN AMERICAN: 81 mL/min/{1.73_m2} (ref >59)
Glucose: 120 mg/dL — ABNORMAL HIGH (ref 65–99)
Potassium: 5.1 mmol/L (ref 3.5–5.2)
Sodium: 144 mmol/L (ref 134–144)

## 2016-03-12 LAB — CBC WITH DIFFERENTIAL/PLATELET
BASOS ABS: 0 10*3/uL (ref 0.0–0.2)
Basos: 1 %
EOS (ABSOLUTE): 0.1 10*3/uL (ref 0.0–0.4)
EOS: 3 %
HEMATOCRIT: 42.1 % (ref 37.5–51.0)
HEMOGLOBIN: 14.4 g/dL (ref 12.6–17.7)
IMMATURE GRANS (ABS): 0 10*3/uL (ref 0.0–0.1)
Immature Granulocytes: 0 %
LYMPHS: 31 %
Lymphocytes Absolute: 1.5 10*3/uL (ref 0.7–3.1)
MCH: 33.2 pg — AB (ref 26.6–33.0)
MCHC: 34.2 g/dL (ref 31.5–35.7)
MCV: 97 fL (ref 79–97)
MONOCYTES: 6 %
Monocytes Absolute: 0.3 10*3/uL (ref 0.1–0.9)
NEUTROS ABS: 2.8 10*3/uL (ref 1.4–7.0)
Neutrophils: 59 %
Platelets: 142 10*3/uL — ABNORMAL LOW (ref 150–379)
RBC: 4.34 x10E6/uL (ref 4.14–5.80)
RDW: 14 % (ref 12.3–15.4)
WBC: 4.7 10*3/uL (ref 3.4–10.8)

## 2016-03-12 LAB — NMR, LIPOPROFILE
CHOLESTEROL: 120 mg/dL (ref 100–199)
HDL Cholesterol by NMR: 45 mg/dL (ref >39)
HDL PARTICLE NUMBER: 30 umol/L — AB (ref >=30.5)
LDL PARTICLE NUMBER: 848 nmol/L (ref <1000)
LDL SIZE: 20.5 nm (ref >20.5)
LDL-C: 66 mg/dL (ref 0–99)
LP-IR SCORE: 49 — AB (ref <=45)
Small LDL Particle Number: 406 nmol/L (ref <=527)
Triglycerides by NMR: 43 mg/dL (ref 0–149)

## 2016-03-12 LAB — THYROID PANEL WITH TSH
Free Thyroxine Index: 2.3 (ref 1.2–4.9)
T3 Uptake Ratio: 32 % (ref 24–39)
T4, Total: 7.2 ug/dL (ref 4.5–12.0)
TSH: 1.08 u[IU]/mL (ref 0.450–4.500)

## 2016-03-13 LAB — VITAMIN D 25 HYDROXY (VIT D DEFICIENCY, FRACTURES): Vit D, 25-Hydroxy: 60 ng/mL (ref 30.0–100.0)

## 2016-04-04 ENCOUNTER — Other Ambulatory Visit: Payer: Medicare Other

## 2016-04-04 DIAGNOSIS — E162 Hypoglycemia, unspecified: Secondary | ICD-10-CM | POA: Diagnosis not present

## 2016-04-04 LAB — BAYER DCA HB A1C WAIVED: HB A1C (BAYER DCA - WAIVED): 6.3 % (ref <7.0)

## 2016-04-29 ENCOUNTER — Other Ambulatory Visit: Payer: Self-pay | Admitting: Family Medicine

## 2016-05-23 DIAGNOSIS — E890 Postprocedural hypothyroidism: Secondary | ICD-10-CM | POA: Diagnosis not present

## 2016-05-23 DIAGNOSIS — C73 Malignant neoplasm of thyroid gland: Secondary | ICD-10-CM | POA: Diagnosis not present

## 2016-06-07 ENCOUNTER — Other Ambulatory Visit: Payer: Self-pay | Admitting: *Deleted

## 2016-06-07 DIAGNOSIS — E039 Hypothyroidism, unspecified: Secondary | ICD-10-CM

## 2016-07-26 ENCOUNTER — Encounter: Payer: Self-pay | Admitting: Family Medicine

## 2016-07-26 ENCOUNTER — Ambulatory Visit (INDEPENDENT_AMBULATORY_CARE_PROVIDER_SITE_OTHER): Payer: Medicare Other | Admitting: Family Medicine

## 2016-07-26 VITALS — BP 114/71 | HR 62 | Temp 97.0°F | Ht 72.0 in | Wt 203.0 lb

## 2016-07-26 DIAGNOSIS — M4726 Other spondylosis with radiculopathy, lumbar region: Secondary | ICD-10-CM | POA: Diagnosis not present

## 2016-07-26 DIAGNOSIS — E559 Vitamin D deficiency, unspecified: Secondary | ICD-10-CM

## 2016-07-26 DIAGNOSIS — R7303 Prediabetes: Secondary | ICD-10-CM | POA: Diagnosis not present

## 2016-07-26 DIAGNOSIS — Z23 Encounter for immunization: Secondary | ICD-10-CM

## 2016-07-26 DIAGNOSIS — E785 Hyperlipidemia, unspecified: Secondary | ICD-10-CM

## 2016-07-26 DIAGNOSIS — R972 Elevated prostate specific antigen [PSA]: Secondary | ICD-10-CM | POA: Diagnosis not present

## 2016-07-26 DIAGNOSIS — E039 Hypothyroidism, unspecified: Secondary | ICD-10-CM

## 2016-07-26 DIAGNOSIS — D696 Thrombocytopenia, unspecified: Secondary | ICD-10-CM

## 2016-07-26 DIAGNOSIS — I10 Essential (primary) hypertension: Secondary | ICD-10-CM

## 2016-07-26 DIAGNOSIS — C73 Malignant neoplasm of thyroid gland: Secondary | ICD-10-CM | POA: Diagnosis not present

## 2016-07-26 MED ORDER — METFORMIN HCL ER 500 MG PO TB24: 500.0000 mg | ORAL_TABLET | Freq: Every day | ORAL | 1 refills | Status: DC

## 2016-07-26 NOTE — Progress Notes (Signed)
Subjective:    Patient ID: Bradley Wong, male    DOB: 09/06/1946, 70 y.o.   MRN: [id number]  HPI Pt here for follow up and management of chronic medical problems which includes hypothyroid, hyperlipidemia, and hypertension. He is taking medications regularly.This patient is doing well. He has a history of thyroid cancer and continues to be followed by the endocrinologist at [facility] in [place]. He also has a history of elevated A1c and is on metformin. He will get lab work today and a PSA will be done and sent to Dr. Jeffie Pollock. He will also receive his flu shot today. His rectal exam is followed by Dr. Jeffie Pollock. The patient will be seen by Dr. Jeffie Pollock sometime by the end of October. He is still followed by his endocrinologist because of the thyroid cancer and his thyroid medicine was increased recently by her. The patient denies any chest pain or pressure. He has his routine amount of shortness of breath. His last stress test was for 5 years ago and he will check with the cardiologist about when he needs another routine stress test. He denies any trouble with heartburn indigestion nausea vomiting or diarrhea. He does indicate these had a change in his stools and that they are harder and smaller in caliber than they used to be and this occurred especially since he had his prostate biopsy less than a year ago. He is passing his water without any problems.    Patient Active Problem List   Diagnosis Date Noted  . Pre-diabetes 06/25/2015  . Thrombocytopenia ([place]) 02/04/2015  . Thyroid cancer ([place]) 09/04/2014  . Hyperlipemia 09/04/2014  . Thyroid activity decreased 09/04/2014  . Osteoarthritis of spine with radiculopathy, lumbar region 09/04/2014  . Osteoarthritis of lumbar spine 05/12/2014  . Vitamin D deficiency 05/12/2014  . Metabolic syndrome 89/38/1017  . Elevated PSA 01/22/2013  . Essential hypertension, benign 01/06/2013   Outpatient Encounter Prescriptions as  of 07/26/2016  Medication Sig  . aspirin 81 MG tablet Take 81 mg by mouth daily.  . Cholecalciferol (VITAMIN D-3) 5000 UNITS TABS Take 1 tablet by mouth daily.  . CRESTOR 40 MG tablet TAKE ONE TABLET BY MOUTH ONE TIME DAILY (Patient taking differently: TAKE ONE-HALF TABLET BY MOUTH ONE TIME DAILY)  . HYDROcodone-acetaminophen (NORCO) 10-325 MG tablet Take 1 tablet by mouth every 6 (six) hours as needed for severe pain.  Marland Kitchen levothyroxine (SYNTHROID, LEVOTHROID) 125 MCG tablet Take 125 mcg by mouth daily before breakfast.  . metFORMIN (GLUCOPHAGE XR) 500 MG 24 hr tablet Take 1 tablet (500 mg total) by mouth daily with breakfast.  . olmesartan (BENICAR) 40 MG tablet TAKE 1 TABLET ONCE A DAY  . [DISCONTINUED] levothyroxine (SYNTHROID, LEVOTHROID) 112 MCG tablet Take 112 mcg by mouth daily.   No facility-administered encounter medications on file as of 07/26/2016.        Review of Systems  Constitutional: Negative.   HENT: Negative.   Eyes: Negative.   Respiratory: Negative.   Cardiovascular: Negative.   Gastrointestinal: Negative.   Endocrine: Negative.   Genitourinary: Negative.   Musculoskeletal: Negative.   Skin: Negative.   Allergic/Immunologic: Negative.   Neurological: Negative.   Hematological: Negative.   Psychiatric/Behavioral: Negative.        Objective:   Physical Exam  Constitutional: He is oriented to person, place, and time. He appears well-developed and well-nourished. No distress.  HENT:  Head: Normocephalic and atraumatic.  Right Ear: External ear normal.  Left Ear: External ear  normal.  Nose: Nose normal.  Mouth/Throat: Oropharynx is clear and moist. No oropharyngeal exudate.  Eyes: Conjunctivae and EOM are normal. Pupils are equal, round, and reactive to light. Right eye exhibits no discharge. Left eye exhibits no discharge. No scleral icterus.  Neck: Normal range of motion. Neck supple. No thyromegaly present.  No bruits thyroid masses or thyromegaly    Cardiovascular: Normal rate, regular rhythm, normal heart sounds and intact distal pulses.   No murmur heard. Heart has a regular rate and rhythm at 72/m  Pulmonary/Chest: Effort normal and breath sounds normal. No respiratory distress. He has no wheezes. He has no rales. He exhibits no tenderness.  Clear anteriorly and posteriorly and no axillary adenopathy  Abdominal: Soft. Bowel sounds are normal. He exhibits no mass. There is no tenderness. There is no rebound and no guarding.  No abdominal tenderness masses or organ enlargement or bruits  Genitourinary:  Genitourinary Comments: The patient has an appointment with his urologist the end of October  Musculoskeletal: Normal range of motion. He exhibits no edema.  Some limited range of motion because of arthritis in the low back.  Lymphadenopathy:    He has no cervical adenopathy.  Neurological: He is alert and oriented to person, place, and time. No cranial nerve deficit.  Reflexes were slightly diminished on the right compared to the left  Skin: Skin is warm and dry. No rash noted.  Dry skin in general.  Psychiatric: He has a normal mood and affect. His behavior is normal. Judgment and thought content normal.  Nursing note and vitals reviewed.  BP 114/71 (BP Location: Left Arm)   Pulse 62   Temp 97 F (36.1 C) (Oral)   Ht 6' (1.829 m)   Wt 203 lb (92.1 kg)   BMI 27.53 kg/m         Assessment & Plan:  1. Hypothyroidism, unspecified hypothyroidism type -The hypothyroidism is secondary to having had his thyroid gland removed because of thyroid cancer. He is followed regularly by the endocrinologist and we help adjust her thyroid medicine based on this history. - CBC with Differential/Platelet; Future  2. Thrombocytopenia ([facility]) -No history of any bleeding issues - CBC with Differential/Platelet; Future  3. Essential hypertension, benign -Blood pressure is good today and he will continue with current treatment - BMP8+EGFR;  Future - CBC with Differential/Platelet; Future - Hepatic function panel; Future  4. Hyperlipemia -Continue with current treatment pending results of lab work - CBC with Differential/Platelet; Future - NMR, lipoprofile; Future  5. Vitamin D deficiency -Continue with current treatment pending results of lab work - CBC with Differential/Platelet; Future - VITAMIN D 25 Hydroxy (Vit-D Deficiency, Fractures); Future  6. Prediabetes -Continue with aggressive therapeutic lifestyle changes and current treatment - BMP8+EGFR; Future - CBC with Differential/Platelet; Future - Bayer DCA Hb A1c Waived; Future  7. Thyroid cancer [facility]) -Follow-up with endocrinology as planned - CBC with Differential/Platelet; Future  8. Elevated PSA -Follow-up with urology as planned - PSA, total and free; Future  9. Encounter for immunization - Flu vaccine HIGH DOSE PF  10. Osteoarthritis of spine with radiculopathy, lumbar region -Tylenol for pain and see neurosurgery if problems progress  Meds ordered this encounter  Medications  . levothyroxine (SYNTHROID, LEVOTHROID) 125 MCG tablet    Sig: Take 125 mcg by mouth daily before breakfast.  . metFORMIN (GLUCOPHAGE XR) 500 MG 24 hr tablet    Sig: Take 1 tablet (500 mg total) by mouth daily with breakfast.    Dispense:  90 tablet    Refill:  1   Patient Instructions                       Medicare Annual Wellness Visit  [facility] and the medical providers at [facility] strive to bring you the best medical care.  In doing so we not only want to address your current medical conditions and concerns but also to detect new conditions early and prevent illness, disease and health-related problems.    Medicare offers a yearly Wellness Visit which allows our clinical staff to assess your need for preventative services including immunizations, lifestyle education, counseling to decrease risk of preventable diseases and screening for  fall risk and other medical concerns.    This visit is provided free of charge (no copay) for all Medicare recipients. The clinical pharmacists at [facility] have begun to conduct these Wellness Visits which will also include a thorough review of all your medications.    As you primary medical provider recommend that you make an appointment for your Annual Wellness Visit if you have not done so already this year.  You may set up this appointment before you leave today or you may call back ([phone]) and schedule an appointment.  Please make sure when you call that you mention that you are scheduling your Annual Wellness Visit with the clinical pharmacist so that the appointment may be made for the proper length of time.     Continue current medications. Continue good therapeutic lifestyle changes which include good diet and exercise. Fall precautions discussed with patient. If an FOBT was given today- please return it to our front desk. If you are over 73 years old - you may need Prevnar 1 or the adult Pneumonia vaccine.  **Flu shots are available--- please call and schedule a FLU-CLINIC appointment**  After your visit with [place] today you will receive a survey in the mail or online from [employer] regarding your care with [place]. Please take a moment to fill this out. Your feedback is very important to [place] as you can help [place] better understand your patient needs as well as improve your experience and satisfaction. WE CARE ABOUT YOU!!!   The flu shot that you received today may make your arm sore Please check with your cardiologist regarding your need for your next routine stress test Follow-up with neurosurgery if persistent back pain recurs We will call with lab work once this lab work becomes available and we will send a copy of it to her urologist and make sure that your endocrinologist gets a copy also Please the medication that you're taking for sweating so we can have the  clinical pharmacist review that to make sure that does not cause any problems with any other medicines that you're using Do not forget to try the probiotic, align to see if this will help change your stools back to a more normal pattern bring  Return the FOBT    Arrie Senate MD

## 2016-07-26 NOTE — Patient Instructions (Addendum)
Medicare Annual Wellness Visit  [facility] and the medical providers at [facility] strive to bring you the best medical care.  In doing so we not only want to address your current medical conditions and concerns but also to detect new conditions early and prevent illness, disease and health-related problems.    Medicare offers a yearly Wellness Visit which allows our clinical staff to assess your need for preventative services including immunizations, lifestyle education, counseling to decrease risk of preventable diseases and screening for fall risk and other medical concerns.    This visit is provided free of charge (no copay) for all Medicare recipients. The clinical pharmacists at [facility] have begun to conduct these Wellness Visits which will also include a thorough review of all your medications.    As you primary medical provider recommend that you make an appointment for your Annual Wellness Visit if you have not done so already this year.  You may set up this appointment before you leave today or you may call back [phone] and schedule an appointment.  Please make sure when you call that you mention that you are scheduling your Annual Wellness Visit with the clinical pharmacist so that the appointment may be made for the proper length of time.     Continue current medications. Continue good therapeutic lifestyle changes which include good diet and exercise. Fall precautions discussed with patient. If an FOBT was given today- please return it to our front desk. If you are over 66 years old - you may need Prevnar 18 or the adult Pneumonia vaccine.  **Flu shots are available--- please call and schedule a FLU-CLINIC appointment**  After your visit with [place] today you will receive a survey in the mail or online from [employer] regarding your care with [place]. Please take a moment to fill this out. Your feedback is very  important to [place] as you can help [place] better understand your patient needs as well as improve your experience and satisfaction. WE CARE ABOUT YOU!!!   The flu shot that you received today may make your arm sore Please check with your cardiologist regarding your need for your next routine stress test Follow-up with neurosurgery if persistent back pain recurs We will call with lab work once this lab work becomes available and we will send a copy of it to her urologist and make sure that your endocrinologist gets a copy also Please the medication that you're taking for sweating so we can have the clinical pharmacist review that to make sure that does not cause any problems with any other medicines that you're using Do not forget to try the probiotic, align to see if this will help change your stools back to a more normal pattern bring  Return the FOBT

## 2016-07-29 ENCOUNTER — Other Ambulatory Visit: Payer: Medicare Other

## 2016-07-29 DIAGNOSIS — E559 Vitamin D deficiency, unspecified: Secondary | ICD-10-CM

## 2016-07-29 DIAGNOSIS — E039 Hypothyroidism, unspecified: Secondary | ICD-10-CM

## 2016-07-29 DIAGNOSIS — D696 Thrombocytopenia, unspecified: Secondary | ICD-10-CM | POA: Diagnosis not present

## 2016-07-29 DIAGNOSIS — R7303 Prediabetes: Secondary | ICD-10-CM

## 2016-07-29 DIAGNOSIS — E785 Hyperlipidemia, unspecified: Secondary | ICD-10-CM

## 2016-07-29 DIAGNOSIS — C73 Malignant neoplasm of thyroid gland: Secondary | ICD-10-CM | POA: Diagnosis not present

## 2016-07-29 DIAGNOSIS — R972 Elevated prostate specific antigen [PSA]: Secondary | ICD-10-CM | POA: Diagnosis not present

## 2016-07-29 DIAGNOSIS — I10 Essential (primary) hypertension: Secondary | ICD-10-CM

## 2016-07-30 LAB — CBC WITH DIFFERENTIAL/PLATELET
BASOS ABS: 0 10*3/uL (ref 0.0–0.2)
Basos: 0 %
EOS (ABSOLUTE): 0.2 10*3/uL (ref 0.0–0.4)
Eos: 3 %
HEMOGLOBIN: 14.2 g/dL (ref 12.6–17.7)
Hematocrit: 41.8 % (ref 37.5–51.0)
IMMATURE GRANS (ABS): 0 10*3/uL (ref 0.0–0.1)
IMMATURE GRANULOCYTES: 0 %
LYMPHS: 25 %
Lymphocytes Absolute: 1.4 10*3/uL (ref 0.7–3.1)
MCH: 32.8 pg (ref 26.6–33.0)
MCHC: 34 g/dL (ref 31.5–35.7)
MCV: 97 fL (ref 79–97)
MONOCYTES: 7 %
Monocytes Absolute: 0.4 10*3/uL (ref 0.1–0.9)
NEUTROS PCT: 65 %
Neutrophils Absolute: 3.6 10*3/uL (ref 1.4–7.0)
PLATELETS: 152 10*3/uL (ref 150–379)
RBC: 4.33 x10E6/uL (ref 4.14–5.80)
RDW: 13.4 % (ref 12.3–15.4)
WBC: 5.5 10*3/uL (ref 3.4–10.8)

## 2016-07-30 LAB — BMP8+EGFR
BUN/Creatinine Ratio: 18 (ref 10–24)
BUN: 19 mg/dL (ref 8–27)
CALCIUM: 9.5 mg/dL (ref 8.6–10.2)
CHLORIDE: 108 mmol/L — AB (ref 96–106)
CO2: 23 mmol/L (ref 18–29)
Creatinine, Ser: 1.05 mg/dL (ref 0.76–1.27)
GFR calc non Af Amer: 73 mL/min/{1.73_m2} (ref >59)
GFR, EST AFRICAN AMERICAN: 84 mL/min/{1.73_m2} (ref >59)
Glucose: 108 mg/dL — ABNORMAL HIGH (ref 65–99)
POTASSIUM: 4.7 mmol/L (ref 3.5–5.2)
Sodium: 145 mmol/L — ABNORMAL HIGH (ref 134–144)

## 2016-07-30 LAB — HEPATIC FUNCTION PANEL
ALBUMIN: 4.3 g/dL (ref 3.6–4.8)
ALK PHOS: 53 IU/L (ref 39–117)
ALT: 26 IU/L (ref 0–44)
AST: 18 IU/L (ref 0–40)
BILIRUBIN TOTAL: 0.5 mg/dL (ref 0.0–1.2)
BILIRUBIN, DIRECT: 0.18 mg/dL (ref 0.00–0.40)
TOTAL PROTEIN: 6.1 g/dL (ref 6.0–8.5)

## 2016-07-30 LAB — TSH: TSH: 2.4 u[IU]/mL (ref 0.450–4.500)

## 2016-07-31 LAB — NMR, LIPOPROFILE
CHOLESTEROL: 112 mg/dL (ref 100–199)
HDL Cholesterol by NMR: 42 mg/dL (ref >39)
HDL PARTICLE NUMBER: 29.5 umol/L — AB (ref >=30.5)
LDL PARTICLE NUMBER: 782 nmol/L (ref <1000)
LDL SIZE: 20.1 nm (ref >20.5)
LDL-C: 57 mg/dL (ref 0–99)
LP-IR Score: 53 — ABNORMAL HIGH (ref <=45)
Small LDL Particle Number: 503 nmol/L (ref <=527)
TRIGLYCERIDES BY NMR: 64 mg/dL (ref 0–149)

## 2016-07-31 LAB — PSA, TOTAL AND FREE
PSA FREE PCT: 17.9 %
PSA, Free: 0.7 ng/mL
Prostate Specific Ag, Serum: 3.9 ng/mL (ref 0.0–4.0)

## 2016-07-31 LAB — BAYER DCA HB A1C WAIVED: HB A1C (BAYER DCA - WAIVED): 5.6 % (ref <7.0)

## 2016-07-31 LAB — VITAMIN D 25 HYDROXY (VIT D DEFICIENCY, FRACTURES): VIT D 25 HYDROXY: 65.2 ng/mL (ref 30.0–100.0)

## 2016-09-15 ENCOUNTER — Ambulatory Visit (INDEPENDENT_AMBULATORY_CARE_PROVIDER_SITE_OTHER): Payer: Medicare Other | Admitting: Urology

## 2016-09-15 DIAGNOSIS — R351 Nocturia: Secondary | ICD-10-CM | POA: Diagnosis not present

## 2016-09-15 DIAGNOSIS — R972 Elevated prostate specific antigen [PSA]: Secondary | ICD-10-CM | POA: Diagnosis not present

## 2016-09-15 DIAGNOSIS — N401 Enlarged prostate with lower urinary tract symptoms: Secondary | ICD-10-CM | POA: Diagnosis not present

## 2016-11-29 ENCOUNTER — Ambulatory Visit (INDEPENDENT_AMBULATORY_CARE_PROVIDER_SITE_OTHER): Payer: Medicare Other | Admitting: Family Medicine

## 2016-11-29 ENCOUNTER — Encounter: Payer: Self-pay | Admitting: Family Medicine

## 2016-11-29 VITALS — BP 104/67 | HR 70 | Temp 97.3°F | Ht 72.0 in | Wt 210.0 lb

## 2016-11-29 DIAGNOSIS — I10 Essential (primary) hypertension: Secondary | ICD-10-CM | POA: Diagnosis not present

## 2016-11-29 DIAGNOSIS — Z23 Encounter for immunization: Secondary | ICD-10-CM

## 2016-11-29 DIAGNOSIS — R7303 Prediabetes: Secondary | ICD-10-CM | POA: Diagnosis not present

## 2016-11-29 DIAGNOSIS — C73 Malignant neoplasm of thyroid gland: Secondary | ICD-10-CM | POA: Diagnosis not present

## 2016-11-29 DIAGNOSIS — E7800 Pure hypercholesterolemia, unspecified: Secondary | ICD-10-CM

## 2016-11-29 DIAGNOSIS — M4726 Other spondylosis with radiculopathy, lumbar region: Secondary | ICD-10-CM

## 2016-11-29 DIAGNOSIS — R972 Elevated prostate specific antigen [PSA]: Secondary | ICD-10-CM

## 2016-11-29 DIAGNOSIS — D696 Thrombocytopenia, unspecified: Secondary | ICD-10-CM | POA: Diagnosis not present

## 2016-11-29 DIAGNOSIS — E559 Vitamin D deficiency, unspecified: Secondary | ICD-10-CM | POA: Diagnosis not present

## 2016-11-29 DIAGNOSIS — Z1211 Encounter for screening for malignant neoplasm of colon: Secondary | ICD-10-CM

## 2016-11-29 NOTE — Patient Instructions (Addendum)
Medicare Annual Wellness Visit  [facility] and the medical providers at [facility] strive to bring you the best medical care.  In doing so we not only want to address your current medical conditions and concerns but also to detect new conditions early and prevent illness, disease and health-related problems.    Medicare offers a yearly Wellness Visit which allows our clinical staff to assess your need for preventative services including immunizations, lifestyle education, counseling to decrease risk of preventable diseases and screening for fall risk and other medical concerns.    This visit is provided free of charge (no copay) for all Medicare recipients. The clinical pharmacists at [facility] have begun to conduct these Wellness Visits which will also include a thorough review of all your medications.    As you primary medical provider recommend that you make an appointment for your Annual Wellness Visit if you have not done so already this year.  You may set up this appointment before you leave today or you may call back [phone] and schedule an appointment.  Please make sure when you call that you mention that you are scheduling your Annual Wellness Visit with the clinical pharmacist so that the appointment may be made for the proper length of time.     Continue current medications. Continue good therapeutic lifestyle changes which include good diet and exercise. Fall precautions discussed with patient. If an FOBT was given today- please return it to our front desk. If you are over 38 years old - you may need Prevnar 58 or the adult Pneumonia vaccine.  **Flu shots are available--- please call and schedule a FLU-CLINIC appointment**  After your visit with [place] today you will receive a survey in the mail or online from [employer] regarding your care with [place]. Please take a moment to fill this out. Your feedback is very  important to [place] as you can help [place] better understand your patient needs as well as improve your experience and satisfaction. WE CARE ABOUT YOU!!!   Make all efforts to watch diet more closely limit salt intake drinking more water and lose weight. Turned to the office for fasting lab work

## 2016-11-29 NOTE — Progress Notes (Signed)
Subjective:    Patient ID: Bradley Wong, male    DOB: 12/05/1945, 71 y.o.   MRN: [id number]  HPI Pt here for follow up and management of chronic medical problems which includes hypertension and hyperlipidemia. He is taking medications regularly.This patient today has no specific complaints. He is followed at [facility] for his thyroid cancer. He is due to get his Pneumovax today. His also due to get lab work and he will return fasting for this. He did bring in an FOBT today. Patient denies any chest pain or shortness of breath. He denies any trouble with his stomach including nausea vomiting diarrhea blood in the stool or black tarry bowel movements. His next colonoscopy is not due until 2024. He did bring in an FOBT card to have checked today. He's passing his water without problems. He has had some problems with an elevated PSA and his urologist is going to continue to follow him on a yearly basis which would come up again in November of this year. He is not having any problems with his back currently and he has to watch what he does to keep this under control.   Patient Active Problem List   Diagnosis Date Noted  . Pre-diabetes 06/25/2015  . Thrombocytopenia ([place]) 02/04/2015  . Thyroid cancer ([place]) 09/04/2014  . Hyperlipemia 09/04/2014  . Thyroid activity decreased 09/04/2014  . Osteoarthritis of spine with radiculopathy, lumbar region 09/04/2014  . Osteoarthritis of lumbar spine 05/12/2014  . Vitamin D deficiency 05/12/2014  . Metabolic syndrome 03/50/0938  . Elevated PSA 01/22/2013  . Essential hypertension, benign 01/06/2013   Outpatient Encounter Prescriptions as of 11/29/2016  Medication Sig  . aspirin 81 MG tablet Take 81 mg by mouth daily.  . Cholecalciferol (VITAMIN D-3) 5000 UNITS TABS Take 1 tablet by mouth daily.  . CRESTOR 40 MG tablet TAKE ONE TABLET BY MOUTH ONE TIME DAILY (Patient taking differently: TAKE ONE-HALF TABLET BY MOUTH  ONE TIME DAILY)  . HYDROcodone-acetaminophen (NORCO) 10-325 MG tablet Take 1 tablet by mouth every 6 (six) hours as needed for severe pain.  Marland Kitchen levothyroxine (SYNTHROID, LEVOTHROID) 125 MCG tablet Take 125 mcg by mouth daily before breakfast.  . metFORMIN (GLUCOPHAGE XR) 500 MG 24 hr tablet Take 1 tablet (500 mg total) by mouth daily with breakfast.  . olmesartan (BENICAR) 40 MG tablet TAKE 1 TABLET ONCE A DAY   No facility-administered encounter medications on file as of 11/29/2016.       Review of Systems  Constitutional: Negative.   HENT: Negative.   Eyes: Negative.   Respiratory: Negative.   Cardiovascular: Negative.   Gastrointestinal: Negative.   Endocrine: Negative.   Genitourinary: Negative.   Musculoskeletal: Negative.   Skin: Negative.   Allergic/Immunologic: Negative.   Neurological: Negative.   Hematological: Negative.   Psychiatric/Behavioral: Negative.        Objective:   Physical Exam  Constitutional: He is oriented to person, place, and time. He appears well-developed and well-nourished. No distress.  HENT:  Head: Normocephalic and atraumatic.  Right Ear: External ear normal.  Left Ear: External ear normal.  Mouth/Throat: Oropharynx is clear and moist. No oropharyngeal exudate.  Nasal congestion bilaterally with turbinate swelling  Eyes: Conjunctivae and EOM are normal. Pupils are equal, round, and reactive to light. Right eye exhibits no discharge. Left eye exhibits no discharge. No scleral icterus.  Neck: Normal range of motion. Neck supple. No thyromegaly present.  No bruits thyromegaly or anterior cervical adenopathy  Cardiovascular: Normal rate, regular rhythm, normal heart sounds and intact distal pulses.   No murmur heard. The heart is regular at 72/m  Pulmonary/Chest: Effort normal and breath sounds normal. No respiratory distress. He has no wheezes. He has no rales. He exhibits no tenderness.  No axillary adenopathy lungs are clear anteriorly and  posteriorly  Abdominal: Soft. Bowel sounds are normal. He exhibits no mass. There is no tenderness. There is no rebound and no guarding.  No abdominal tenderness masses or bruits  Genitourinary:  Genitourinary Comments: Patient will continue to follow-up yearly with Dr. Jeffie Pollock, his urologist and this occurs in November. This is because of his elevated PSA.  Musculoskeletal: Normal range of motion. He exhibits no edema.  Lymphadenopathy:    He has no cervical adenopathy.  Neurological: He is alert and oriented to person, place, and time. He has normal reflexes. No cranial nerve deficit.  Skin: Skin is warm and dry. No rash noted.  Dry skin in general  Psychiatric: He has a normal mood and affect. His behavior is normal. Judgment and thought content normal.  Nursing note and vitals reviewed.  BP 104/67 (BP Location: Left Arm)   Pulse 70   Temp 97.3 F (36.3 C) (Oral)   Ht 6' (1.829 m)   Wt 210 lb (95.3 kg)   BMI 28.48 kg/m         Assessment & Plan:  1. Thrombocytopenia ([place]) -The patient denies any bleeding issues. - CBC with Differential/Platelet; Future - Fecal occult blood, imunochemical  2. Essential hypertension, benign -The blood pressure is good today and he will continue with current treatment - BMP8+EGFR; Future - CBC with Differential/Platelet; Future - Hepatic function panel; Future  3. Pure hypercholesterolemia -Continue with aggressive therapeutic lifestyle changes and current treatment pending results of lab work - CBC with Differential/Platelet; Future - NMR, lipoprofile; Future  4. Vitamin D deficiency -Continue with current treatment pending results of lab work - CBC with Differential/Platelet; Future - VITAMIN D 25 Hydroxy (Vit-D Deficiency, Fractures); Future  5. Prediabetes -The patient must do better with weight management and control with better diet habits and more exercise. This was reemphasized to him again today. - CBC with  Differential/Platelet; Future  6. Thyroid cancer ([place]) -Continue to follow-up with endocrinologist at [facility] with Differential/Platelet; Future  7. Elevated PSA -Continue to follow-up with urologist every November - CBC with Differential/Platelet; Future  8. Screen for colon cancer - Fecal occult blood, imunochemical  9. Osteoarthritis of spine with radiculopathy, lumbar region -Make every effort to be careful and to not fall and cannot climb and to avoid heavy lifting  Patient Instructions                       Medicare Annual Wellness Visit  [facility] and the medical providers at [facility] strive to bring you the best medical care.  In doing so we not only want to address your current medical conditions and concerns but also to detect new conditions early and prevent illness, disease and health-related problems.    Medicare offers a yearly Wellness Visit which allows our clinical staff to assess your need for preventative services including immunizations, lifestyle education, counseling to decrease risk of preventable diseases and screening for fall risk and other medical concerns.    This visit is provided free of charge (no copay) for all Medicare recipients. The clinical pharmacists at [facility]  have begun to conduct these Wellness Visits which will also include a thorough review of all your medications.    As you primary medical provider recommend that you make an appointment for your Annual Wellness Visit if you have not done so already this year.  You may set up this appointment before you leave today or you may call back ([phone]) and schedule an appointment.  Please make sure when you call that you mention that you are scheduling your Annual Wellness Visit with the clinical pharmacist so that the appointment may be made for the proper length of time.     Continue current  medications. Continue good therapeutic lifestyle changes which include good diet and exercise. Fall precautions discussed with patient. If an FOBT was given today- please return it to our front desk. If you are over 65 years old - you may need Prevnar 23 or the adult Pneumonia vaccine.  **Flu shots are available--- please call and schedule a FLU-CLINIC appointment**  After your visit with [place] today you will receive a survey in the mail or online from [employer] regarding your care with [place]. Please take a moment to fill this out. Your feedback is very important to [place] as you can help [place] better understand your patient needs as well as improve your experience and satisfaction. WE CARE ABOUT YOU!!!   Make all efforts to watch diet more closely limit salt intake drinking more water and lose weight. Turned to the office for fasting lab work   Arrie Senate MD

## 2016-11-30 LAB — FECAL OCCULT BLOOD, IMMUNOCHEMICAL: FECAL OCCULT BLD: NEGATIVE

## 2016-12-04 ENCOUNTER — Telehealth: Payer: Self-pay | Admitting: Family Medicine

## 2016-12-05 MED ORDER — OSELTAMIVIR PHOSPHATE 75 MG PO CAPS: 75.0000 mg | ORAL_CAPSULE | Freq: Every day | ORAL | 0 refills | Status: DC

## 2016-12-05 NOTE — Telephone Encounter (Signed)
Please call when Tamiflu 75 mg 1 daily 10 days

## 2016-12-09 ENCOUNTER — Other Ambulatory Visit: Payer: Medicare Other

## 2016-12-09 DIAGNOSIS — D696 Thrombocytopenia, unspecified: Secondary | ICD-10-CM

## 2016-12-09 DIAGNOSIS — R972 Elevated prostate specific antigen [PSA]: Secondary | ICD-10-CM | POA: Diagnosis not present

## 2016-12-09 DIAGNOSIS — R7303 Prediabetes: Secondary | ICD-10-CM

## 2016-12-09 DIAGNOSIS — E559 Vitamin D deficiency, unspecified: Secondary | ICD-10-CM

## 2016-12-09 DIAGNOSIS — E7800 Pure hypercholesterolemia, unspecified: Secondary | ICD-10-CM

## 2016-12-09 DIAGNOSIS — I10 Essential (primary) hypertension: Secondary | ICD-10-CM | POA: Diagnosis not present

## 2016-12-09 DIAGNOSIS — C73 Malignant neoplasm of thyroid gland: Secondary | ICD-10-CM

## 2016-12-11 LAB — CBC WITH DIFFERENTIAL/PLATELET
BASOS ABS: 0 10*3/uL (ref 0.0–0.2)
Basos: 0 %
EOS (ABSOLUTE): 0.2 10*3/uL (ref 0.0–0.4)
Eos: 3 %
HEMATOCRIT: 43.2 % (ref 37.5–51.0)
Hemoglobin: 14.7 g/dL (ref 13.0–17.7)
Immature Grans (Abs): 0 10*3/uL (ref 0.0–0.1)
Immature Granulocytes: 0 %
LYMPHS ABS: 1.9 10*3/uL (ref 0.7–3.1)
Lymphs: 36 %
MCH: 32.7 pg (ref 26.6–33.0)
MCHC: 34 g/dL (ref 31.5–35.7)
MCV: 96 fL (ref 79–97)
MONOS ABS: 0.3 10*3/uL (ref 0.1–0.9)
Monocytes: 6 %
NEUTROS PCT: 55 %
Neutrophils Absolute: 2.9 10*3/uL (ref 1.4–7.0)
Platelets: 128 10*3/uL — ABNORMAL LOW (ref 150–379)
RBC: 4.5 x10E6/uL (ref 4.14–5.80)
RDW: 14 % (ref 12.3–15.4)
WBC: 5.3 10*3/uL (ref 3.4–10.8)

## 2016-12-11 LAB — NMR, LIPOPROFILE
CHOLESTEROL: 108 mg/dL (ref 100–199)
HDL CHOLESTEROL BY NMR: 41 mg/dL (ref >39)
HDL Particle Number: 32 umol/L (ref >=30.5)
LDL Particle Number: 897 nmol/L (ref <1000)
LDL Size: 20 nm (ref >20.5)
LDL-C: 56 mg/dL (ref 0–99)
LP-IR SCORE: 56 — AB (ref <=45)
Small LDL Particle Number: 611 nmol/L — ABNORMAL HIGH (ref <=527)
Triglycerides by NMR: 54 mg/dL (ref 0–149)

## 2016-12-11 LAB — BMP8+EGFR
BUN / CREAT RATIO: 23 (ref 10–24)
BUN: 22 mg/dL (ref 8–27)
CO2: 21 mmol/L (ref 18–29)
Calcium: 9 mg/dL (ref 8.6–10.2)
Chloride: 105 mmol/L (ref 96–106)
Creatinine, Ser: 0.94 mg/dL (ref 0.76–1.27)
GFR calc non Af Amer: 83 mL/min/{1.73_m2} (ref >59)
GFR, EST AFRICAN AMERICAN: 96 mL/min/{1.73_m2} (ref >59)
Glucose: 114 mg/dL — ABNORMAL HIGH (ref 65–99)
POTASSIUM: 5.1 mmol/L (ref 3.5–5.2)
SODIUM: 142 mmol/L (ref 134–144)

## 2016-12-11 LAB — HEPATIC FUNCTION PANEL
ALBUMIN: 4.4 g/dL (ref 3.6–4.8)
ALT: 41 IU/L (ref 0–44)
AST: 19 IU/L (ref 0–40)
Alkaline Phosphatase: 54 IU/L (ref 39–117)
Bilirubin Total: 0.7 mg/dL (ref 0.0–1.2)
Bilirubin, Direct: 0.22 mg/dL (ref 0.00–0.40)
Total Protein: 6.2 g/dL (ref 6.0–8.5)

## 2016-12-11 LAB — VITAMIN D 25 HYDROXY (VIT D DEFICIENCY, FRACTURES): Vit D, 25-Hydroxy: 64 ng/mL (ref 30.0–100.0)

## 2017-02-02 ENCOUNTER — Other Ambulatory Visit: Payer: Self-pay | Admitting: Family Medicine

## 2017-03-20 ENCOUNTER — Other Ambulatory Visit: Payer: Self-pay | Admitting: Family Medicine

## 2017-04-18 ENCOUNTER — Ambulatory Visit (INDEPENDENT_AMBULATORY_CARE_PROVIDER_SITE_OTHER): Payer: Medicare Other | Admitting: Family Medicine

## 2017-04-18 ENCOUNTER — Encounter: Payer: Self-pay | Admitting: Family Medicine

## 2017-04-18 VITALS — BP 112/70 | HR 72 | Temp 97.4°F | Ht 72.0 in | Wt 206.0 lb

## 2017-04-18 DIAGNOSIS — E118 Type 2 diabetes mellitus with unspecified complications: Secondary | ICD-10-CM | POA: Diagnosis not present

## 2017-04-18 DIAGNOSIS — D696 Thrombocytopenia, unspecified: Secondary | ICD-10-CM

## 2017-04-18 DIAGNOSIS — E559 Vitamin D deficiency, unspecified: Secondary | ICD-10-CM

## 2017-04-18 DIAGNOSIS — R972 Elevated prostate specific antigen [PSA]: Secondary | ICD-10-CM

## 2017-04-18 DIAGNOSIS — E7800 Pure hypercholesterolemia, unspecified: Secondary | ICD-10-CM

## 2017-04-18 DIAGNOSIS — C73 Malignant neoplasm of thyroid gland: Secondary | ICD-10-CM

## 2017-04-18 DIAGNOSIS — R7303 Prediabetes: Secondary | ICD-10-CM

## 2017-04-18 DIAGNOSIS — I10 Essential (primary) hypertension: Secondary | ICD-10-CM

## 2017-04-18 LAB — BAYER DCA HB A1C WAIVED: HB A1C (BAYER DCA - WAIVED): 6 % (ref <7.0)

## 2017-04-18 MED ORDER — HYDROCODONE-ACETAMINOPHEN 10-325 MG PO TABS: 1.0000 | ORAL_TABLET | Freq: Four times a day (QID) | ORAL | 0 refills | Status: DC | PRN

## 2017-04-18 MED ORDER — OLMESARTAN MEDOXOMIL 40 MG PO TABS: 40.0000 mg | ORAL_TABLET | Freq: Every day | ORAL | 3 refills | Status: DC

## 2017-04-18 MED ORDER — METFORMIN HCL ER 500 MG PO TB24: ORAL_TABLET | ORAL | 3 refills | Status: DC

## 2017-04-18 MED ORDER — ROSUVASTATIN CALCIUM 40 MG PO TABS: 40.0000 mg | ORAL_TABLET | Freq: Every day | ORAL | 3 refills | Status: DC

## 2017-04-18 MED ORDER — LEVOTHYROXINE SODIUM 125 MCG PO TABS: 125.0000 ug | ORAL_TABLET | Freq: Every day | ORAL | 3 refills | Status: DC

## 2017-04-18 NOTE — Progress Notes (Signed)
Subjective:    Patient ID: Bradley Wong, male    DOB: 09-26-1946, 71 y.o.   MRN: [id number]  HPI Pt here for follow up and management of chronic medical problems which includes hyperlipidemia and hypertension. He is taking medication regularly.The patient is doing well overall and has no specific complaints today. He has a history of thyroid cancer chronic low back pain secondary to degenerative disc disease hyperlipidemia and he is currently taking metformin for his blood sugar. He takes hydrocodone for his episodic problems with back pain. His last colonoscopy was 2014. The patient denies any chest pain shortness of breath problems with his stomach including swallowing nausea vomiting diarrhea blood in the stool or black tarry bowel movements. He is passing his water without problems. He has ongoing chronic back pain as mentioned earlier and his neurosurgeon recently retired. Return if he had future problems to call us and we will make a recommendation for one of the other doctors in the group. The patient does not check his blood sugars at home even though he is taking metformin regularly. The and his wife are both due for eye exams and we will make sure that we get a copy of that report. He is followed regularly by Dr. Jeffie Pollock because of an elevated PSA and we'll see him sometime in October or November. We will add a PSA to his blood work today as well as a hemoglobin A1c.     Patient Active Problem List   Diagnosis Date Noted  . Pre-diabetes 06/25/2015  . Thrombocytopenia ([place]) 02/04/2015  . Thyroid cancer ([place]) 09/04/2014  . Hyperlipemia 09/04/2014  . Thyroid activity decreased 09/04/2014  . Osteoarthritis of spine with radiculopathy, lumbar region 09/04/2014  . Osteoarthritis of lumbar spine 05/12/2014  . Vitamin D deficiency 05/12/2014  . Metabolic syndrome 55/73/2202  . Elevated PSA 01/22/2013  . Essential hypertension, benign 01/06/2013   Outpatient Encounter Prescriptions as  of 04/18/2017  Medication Sig  . aspirin 81 MG tablet Take 81 mg by mouth daily.  . Cholecalciferol (VITAMIN D-3) 5000 UNITS TABS Take 1 tablet by mouth daily.  Marland Kitchen HYDROcodone-acetaminophen (NORCO) 10-325 MG tablet Take 1 tablet by mouth every 6 (six) hours as needed for severe pain.  Marland Kitchen levothyroxine (SYNTHROID, LEVOTHROID) 125 MCG tablet Take 125 mcg by mouth daily before breakfast.  . metFORMIN (GLUCOPHAGE-XR) 500 MG 24 hr tablet TAKE (1) TABLET DAILY WITH BREAKFAST.  Marland Kitchen olmesartan (BENICAR) 40 MG tablet TAKE 1 TABLET ONCE A DAY  . rosuvastatin (CRESTOR) 40 MG tablet TAKE 1 TABLET ONCE A DAY  . [DISCONTINUED] oseltamivir (TAMIFLU) 75 MG capsule Take 1 capsule (75 mg total) by mouth daily.   No facility-administered encounter medications on file as of 04/18/2017.      Review of Systems  Constitutional: Negative.   HENT: Negative.   Eyes: Negative.   Respiratory: Negative.   Cardiovascular: Negative.   Gastrointestinal: Negative.   Endocrine: Negative.   Genitourinary: Negative.   Musculoskeletal: Negative.   Skin: Negative.   Allergic/Immunologic: Negative.   Neurological: Negative.   Hematological: Negative.   Psychiatric/Behavioral: Negative.        Objective:   Physical Exam  Constitutional: He is oriented to person, place, and time. He appears well-developed and well-nourished. No distress.  The patient is pleasant and alert  HENT:  Head: Normocephalic and atraumatic.  Right Ear: External ear normal.  Left Ear: External ear normal.  Nose: Nose normal.  Mouth/Throat: Oropharynx is clear and moist. No oropharyngeal exudate.  Eyes: Conjunctivae and EOM are normal. Pupils are equal, round, and reactive to light. Right eye exhibits no discharge. Left eye exhibits no discharge. No scleral icterus.  Get eye exam as planned  Neck: Normal range of motion. Neck supple. No thyromegaly present.  No bruits thyromegaly or anterior cervical adenopathy  Cardiovascular: Normal rate,  regular rhythm and intact distal pulses.   No murmur heard. The heart is regular at 60/m  Pulmonary/Chest: Effort normal and breath sounds normal. No respiratory distress. He has no wheezes. He has no rales. He exhibits no tenderness.  Clear anteriorly and posteriorly and no axillary adenopathy  Abdominal: Soft. Bowel sounds are normal. He exhibits no mass. There is no tenderness. There is no rebound and no guarding.  No abdominal tenderness masses bruits or organ enlargement  Genitourinary:  Genitourinary Comments: The patient sees the urologist yearly in the fall for his rectal exam and PSA check. We usually do the PSA here for him to take with him to that visit.  Musculoskeletal: Normal range of motion. He exhibits no edema.  Minimal problems today with his back pain.  Lymphadenopathy:    He has no cervical adenopathy.  Neurological: He is alert and oriented to person, place, and time. He has normal reflexes. No cranial nerve deficit.  Skin: Skin is warm and dry. No rash noted.  Psychiatric: He has a normal mood and affect. His behavior is normal. Judgment and thought content normal.  Nursing note and vitals reviewed.   BP 112/70 (BP Location: Left Arm)   Pulse 72   Temp 97.4 F (36.3 C) (Oral)   Ht 6' (1.829 m)   Wt 206 lb (93.4 kg)   BMI 27.94 kg/m         Assessment & Plan:  1. Essential hypertension, benign -The blood pressure is good today and he will continue with current treatment - CBC with Differential/Platelet - BMP8+EGFR - Hepatic function panel  2. Thrombocytopenia (HCC) -No bleeding issues or problems noted from patient. - CBC with Differential/Platelet  3. Pure hypercholesterolemia -Continue current treatment pending results of lab work - CBC with Differential/Platelet - NMR, lipoprofile  4. Vitamin D deficiency -Continue current treatment pending results of lab work - CBC with Differential/Platelet - VITAMIN D 25 Hydroxy (Vit-D Deficiency,  Fractures)  5. Thyroid cancer [facility]) -Follow-up with Dr. Jones Bales at [facility] as planned in August. She does thyroid testing at that time. - CBC with Differential/Platelet  6. Controlled type 2 diabetes mellitus with complication, without long-term current use of insulin (HCC) - Bayer DCA Hb A1c Waived  7. Elevated PSA -Follow-up with Dr. Jeffie Pollock in October or November but we will get a PSA here today for him to take to that visit - PSA, total and free  8. Prediabetes -Follow-up as aggressive therapeutic lifestyle changes as possible which include diet and exercise  Meds ordered this encounter  Medications  . HYDROcodone-acetaminophen (NORCO) 10-325 MG tablet    Sig: Take 1 tablet by mouth every 6 (six) hours as needed for severe pain.    Dispense:  60 tablet    Refill:  0  . metFORMIN (GLUCOPHAGE-XR) 500 MG 24 hr tablet    Sig: TAKE (1) TABLET DAILY WITH BREAKFAST.    Dispense:  90 tablet    Refill:  3  . olmesartan (BENICAR) 40 MG tablet    Sig: Take 1 tablet (40 mg total) by mouth daily.    Dispense:  90 tablet  Refill:  3  . rosuvastatin (CRESTOR) 40 MG tablet    Sig: Take 1 tablet (40 mg total) by mouth daily.    Dispense:  90 tablet    Refill:  3  . levothyroxine (SYNTHROID, LEVOTHROID) 125 MCG tablet    Sig: Take 1 tablet (125 mcg total) by mouth daily before breakfast.    Dispense:  90 tablet    Refill:  3   Patient Instructions                       Medicare Annual Wellness Visit  [facility] and the medical providers at [place] strive to bring you the best medical care.  In doing so we not only want to address your current medical conditions and concerns but also to detect new conditions early and prevent illness, disease and health-related problems.    Medicare offers a yearly Wellness Visit which allows our clinical staff to assess your need for preventative services including  immunizations, lifestyle education, counseling to decrease risk of preventable diseases and screening for fall risk and other medical concerns.    This visit is provided free of charge (no copay) for all Medicare recipients. The clinical pharmacists at [facility] have begun to conduct these Wellness Visits which will also include a thorough review of all your medications.    As you primary medical provider recommend that you make an appointment for your Annual Wellness Visit if you have not done so already this year.  You may set up this appointment before you leave today or you may call back ([phone]) and schedule an appointment.  Please make sure when you call that you mention that you are scheduling your Annual Wellness Visit with the clinical pharmacist so that the appointment may be made for the proper length of time.     Continue current medications. Continue good therapeutic lifestyle changes which include good diet and exercise. Fall precautions discussed with patient. If an FOBT was given today- please return it to our front desk. If you are over 43 years old - you may need Prevnar 61 or the adult Pneumonia vaccine.  **Flu shots are available--- please call and schedule a FLU-CLINIC appointment**  After your visit with [place] today you will receive a survey in the mail or online from [employer] regarding your care with [place]. Please take a moment to fill this out. Your feedback is very important to [place] as you can help [place] better understand your patient needs as well as improve your experience and satisfaction. WE CARE ABOUT YOU!!!   Follow-up with endocrinologist as planned Follow-up with urologist as planned Drink plenty of fluids and stay well hydrated Check with cardiology regarding routine stress test because of risk factors We will call with results of PSA test as soon as those results become available Always check your body closely for ticks Check blood sugars  periodically at home and bring these readings to the office visit for review  Arrie Senate MD

## 2017-04-18 NOTE — Patient Instructions (Addendum)
Medicare Annual Wellness Visit  [facility] and the medical providers at [facility] strive to bring you the best medical care.  In doing so we not only want to address your current medical conditions and concerns but also to detect new conditions early and prevent illness, disease and health-related problems.    Medicare offers a yearly Wellness Visit which allows our clinical staff to assess your need for preventative services including immunizations, lifestyle education, counseling to decrease risk of preventable diseases and screening for fall risk and other medical concerns.    This visit is provided free of charge (no copay) for all Medicare recipients. The clinical pharmacists at [facility] have begun to conduct these Wellness Visits which will also include a thorough review of all your medications.    As you primary medical provider recommend that you make an appointment for your Annual Wellness Visit if you have not done so already this year.  You may set up this appointment before you leave today or you may call back [phone]) and schedule an appointment.  Please make sure when you call that you mention that you are scheduling your Annual Wellness Visit with the clinical pharmacist so that the appointment may be made for the proper length of time.     Continue current medications. Continue good therapeutic lifestyle changes which include good diet and exercise. Fall precautions discussed with patient. If an FOBT was given today- please return it to our front desk. If you are over 36 years old - you may need Prevnar 69 or the adult Pneumonia vaccine.  **Flu shots are available--- please call and schedule a FLU-CLINIC appointment**  After your visit with [place] today you will receive a survey in the mail or online from [employer] regarding your care with [place]. Please take a moment to fill this out. Your feedback is very  important to [place] as you can help [place] better understand your patient needs as well as improve your experience and satisfaction. WE CARE ABOUT YOU!!!   Follow-up with endocrinologist as planned Follow-up with urologist as planned Drink plenty of fluids and stay well hydrated Check with cardiology regarding routine stress test because of risk factors We will call with results of PSA test as soon as those results become available Always check your body closely for ticks Check blood sugars periodically at home and bring these readings to the office visit for review

## 2017-04-19 LAB — CBC WITH DIFFERENTIAL/PLATELET
Basophils Absolute: 0 10*3/uL (ref 0.0–0.2)
Basos: 0 %
EOS (ABSOLUTE): 0.1 10*3/uL (ref 0.0–0.4)
EOS: 2 %
HEMATOCRIT: 44.3 % (ref 37.5–51.0)
HEMOGLOBIN: 15.2 g/dL (ref 13.0–17.7)
IMMATURE GRANULOCYTES: 0 %
Immature Grans (Abs): 0 10*3/uL (ref 0.0–0.1)
Lymphocytes Absolute: 1.5 10*3/uL (ref 0.7–3.1)
Lymphs: 26 %
MCH: 33.3 pg — ABNORMAL HIGH (ref 26.6–33.0)
MCHC: 34.3 g/dL (ref 31.5–35.7)
MCV: 97 fL (ref 79–97)
MONOCYTES: 6 %
Monocytes Absolute: 0.4 10*3/uL (ref 0.1–0.9)
NEUTROS PCT: 66 %
Neutrophils Absolute: 3.8 10*3/uL (ref 1.4–7.0)
Platelets: 151 10*3/uL (ref 150–379)
RBC: 4.56 x10E6/uL (ref 4.14–5.80)
RDW: 14.3 % (ref 12.3–15.4)
WBC: 5.9 10*3/uL (ref 3.4–10.8)

## 2017-04-19 LAB — HEPATIC FUNCTION PANEL
ALT: 38 IU/L (ref 0–44)
AST: 27 IU/L (ref 0–40)
Albumin: 4.7 g/dL (ref 3.6–4.8)
Alkaline Phosphatase: 55 IU/L (ref 39–117)
BILIRUBIN TOTAL: 0.9 mg/dL (ref 0.0–1.2)
Bilirubin, Direct: 0.28 mg/dL (ref 0.00–0.40)
TOTAL PROTEIN: 6.8 g/dL (ref 6.0–8.5)

## 2017-04-19 LAB — PSA, TOTAL AND FREE
PSA FREE PCT: 18.4 %
PSA, Free: 0.81 ng/mL
Prostate Specific Ag, Serum: 4.4 ng/mL — ABNORMAL HIGH (ref 0.0–4.0)

## 2017-04-19 LAB — BMP8+EGFR
BUN/Creatinine Ratio: 18 (ref 10–24)
BUN: 19 mg/dL (ref 8–27)
CO2: 22 mmol/L (ref 20–29)
CREATININE: 1.07 mg/dL (ref 0.76–1.27)
Calcium: 9.1 mg/dL (ref 8.6–10.2)
Chloride: 105 mmol/L (ref 96–106)
GFR calc Af Amer: 82 mL/min/{1.73_m2} (ref >59)
GFR, EST NON AFRICAN AMERICAN: 71 mL/min/{1.73_m2} (ref >59)
Glucose: 121 mg/dL — ABNORMAL HIGH (ref 65–99)
POTASSIUM: 4.8 mmol/L (ref 3.5–5.2)
Sodium: 141 mmol/L (ref 134–144)

## 2017-04-19 LAB — NMR, LIPOPROFILE
Cholesterol: 104 mg/dL (ref 100–199)
HDL CHOLESTEROL BY NMR: 44 mg/dL (ref >39)
HDL PARTICLE NUMBER: 33.4 umol/L (ref >=30.5)
LDL Particle Number: 716 nmol/L (ref <1000)
LDL Size: 20.4 nm (ref >20.5)
LDL-C: 48 mg/dL (ref 0–99)
LP-IR Score: 47 — ABNORMAL HIGH (ref <=45)
SMALL LDL PARTICLE NUMBER: 361 nmol/L (ref <=527)
TRIGLYCERIDES BY NMR: 62 mg/dL (ref 0–149)

## 2017-04-19 LAB — VITAMIN D 25 HYDROXY (VIT D DEFICIENCY, FRACTURES): Vit D, 25-Hydroxy: 61.5 ng/mL (ref 30.0–100.0)

## 2017-06-05 DIAGNOSIS — C73 Malignant neoplasm of thyroid gland: Secondary | ICD-10-CM | POA: Diagnosis not present

## 2017-07-06 DIAGNOSIS — E890 Postprocedural hypothyroidism: Secondary | ICD-10-CM | POA: Diagnosis not present

## 2017-07-06 DIAGNOSIS — Z79890 Hormone replacement therapy (postmenopausal): Secondary | ICD-10-CM | POA: Diagnosis not present

## 2017-07-06 DIAGNOSIS — C73 Malignant neoplasm of thyroid gland: Secondary | ICD-10-CM | POA: Diagnosis not present

## 2017-07-06 DIAGNOSIS — Z85850 Personal history of malignant neoplasm of thyroid: Secondary | ICD-10-CM | POA: Diagnosis not present

## 2017-09-10 ENCOUNTER — Other Ambulatory Visit: Payer: Medicare Other

## 2017-09-10 DIAGNOSIS — R972 Elevated prostate specific antigen [PSA]: Secondary | ICD-10-CM

## 2017-09-11 LAB — PSA, TOTAL AND FREE
PSA FREE PCT: 24.3 %
PSA, Free: 1.19 ng/mL
Prostate Specific Ag, Serum: 4.9 ng/mL — ABNORMAL HIGH (ref 0.0–4.0)

## 2017-09-14 ENCOUNTER — Ambulatory Visit (INDEPENDENT_AMBULATORY_CARE_PROVIDER_SITE_OTHER): Payer: Medicare Other | Admitting: Urology

## 2017-09-14 DIAGNOSIS — R972 Elevated prostate specific antigen [PSA]: Secondary | ICD-10-CM | POA: Diagnosis not present

## 2017-09-14 DIAGNOSIS — R351 Nocturia: Secondary | ICD-10-CM

## 2017-09-14 DIAGNOSIS — N401 Enlarged prostate with lower urinary tract symptoms: Secondary | ICD-10-CM

## 2017-12-05 ENCOUNTER — Encounter: Payer: Self-pay | Admitting: Family Medicine

## 2017-12-05 ENCOUNTER — Ambulatory Visit (INDEPENDENT_AMBULATORY_CARE_PROVIDER_SITE_OTHER): Payer: Medicare Other

## 2017-12-05 ENCOUNTER — Ambulatory Visit (INDEPENDENT_AMBULATORY_CARE_PROVIDER_SITE_OTHER): Payer: Medicare Other | Admitting: Family Medicine

## 2017-12-05 VITALS — BP 116/70 | HR 75 | Temp 96.9°F | Ht 72.0 in | Wt 211.0 lb

## 2017-12-05 DIAGNOSIS — I10 Essential (primary) hypertension: Secondary | ICD-10-CM | POA: Diagnosis not present

## 2017-12-05 DIAGNOSIS — R972 Elevated prostate specific antigen [PSA]: Secondary | ICD-10-CM | POA: Diagnosis not present

## 2017-12-05 DIAGNOSIS — E118 Type 2 diabetes mellitus with unspecified complications: Secondary | ICD-10-CM | POA: Diagnosis not present

## 2017-12-05 DIAGNOSIS — E7800 Pure hypercholesterolemia, unspecified: Secondary | ICD-10-CM

## 2017-12-05 DIAGNOSIS — E559 Vitamin D deficiency, unspecified: Secondary | ICD-10-CM | POA: Diagnosis not present

## 2017-12-05 DIAGNOSIS — D696 Thrombocytopenia, unspecified: Secondary | ICD-10-CM

## 2017-12-05 DIAGNOSIS — C73 Malignant neoplasm of thyroid gland: Secondary | ICD-10-CM | POA: Diagnosis not present

## 2017-12-05 NOTE — Patient Instructions (Addendum)
Medicare Annual Wellness Visit  [facility] and the medical providers at [facility] strive to bring you the best medical care.  In doing so we not only want to address your current medical conditions and concerns but also to detect new conditions early and prevent illness, disease and health-related problems.    Medicare offers a yearly Wellness Visit which allows our clinical staff to assess your need for preventative services including immunizations, lifestyle education, counseling to decrease risk of preventable diseases and screening for fall risk and other medical concerns.    This visit is provided free of charge (no copay) for all Medicare recipients. The clinical pharmacists at [facility] have begun to conduct these Wellness Visits which will also include a thorough review of all your medications.    As you primary medical provider recommend that you make an appointment for your Annual Wellness Visit if you have not done so already this year.  You may set up this appointment before you leave today or you may call back [phone]) and schedule an appointment.  Please make sure when you call that you mention that you are scheduling your Annual Wellness Visit with the clinical pharmacist so that the appointment may be made for the proper length of time.     Continue current medications. Continue good therapeutic lifestyle changes which include good diet and exercise. Fall precautions discussed with patient. If an FOBT was given today- please return it to our front desk. If you are over 75 years old - you may need Prevnar 11 or the adult Pneumonia vaccine.  **Flu shots are available--- please call and schedule a FLU-CLINIC appointment**  After your visit with [place] today you will receive a survey in the mail or online from [employer] regarding your care with [place]. Please take a moment to fill this out. Your feedback is very  important to [place] as you can help [place] better understand your patient needs as well as improve your experience and satisfaction. WE CARE ABOUT YOU!!!   It is very important that you get your eyes checked regularly.  Please schedule for an eye exam. Continue to follow-up with the urologist as planned and we will make sure that he gets a copy of the PSA test from the blood work that we plan to do our new You do have a history of hyperlipidemia and we will schedule you for a visit for good cardiac exam with a cardiologist sometime this spring Continue to use your head and avoid heavy lifting pushing and pulling as much as possible   Call with chest x-ray results as soon as it becomes available

## 2017-12-05 NOTE — Progress Notes (Signed)
Subjective:    Patient ID: Bradley Wong, male    DOB: 10/05/1946, 72 y.o.   MRN: [id number]  HPI  Pt here for follow up and management of chronic medical problems which includes hypertension and hyperlipidemia. He is taking medication regularly.  Time he is doing well today with no specific complaints or problems.  He continues to be followed regularly by the urologist because of an elevated PSA.  He is also followed because of his thyroid cancer by his specialist at [facility].  He will return to the office fasting for lab work as well as a PSA.  He has a follow-up visit scheduled with the urologist, Dr. Jeffie Pollock.  He will get a chest x-ray today and will be given an FOBT to return.  His vital signs are stable.  His blood pressure is good. She denies chest pain or shortness of breath.  He denies any trouble with his stomach including nausea vomiting diarrhea blood in the stool or black tarry bowel movements.  His next colonoscopy is not due until 2024.  He is passing his water better on Flomax but still it is somewhat slow.  He has had an elevated PSA and we will check the PSA with the blood work and make sure that Dr. Jeffie Pollock gets a copy of this.  We will also schedule him for routine purposes to have a good cardiac exam because of age and activity.  We will schedule this with Dr. Burt Knack.  He continues to see Dr. Aline Brochure because of his thyroid cancer and sees Dr. Braulio Conte in [place] once yearly.  Dr. Jeffie Pollock will be seen again about May of this year.    Patient Active Problem List   Diagnosis Date Noted  . Pre-diabetes 06/25/2015  . Thrombocytopenia ([facility]) 02/04/2015  . Thyroid cancer ([facility]) 09/04/2014  . Hyperlipemia 09/04/2014  . Thyroid activity decreased 09/04/2014  . Osteoarthritis of spine with radiculopathy, lumbar region 09/04/2014  . Osteoarthritis of lumbar spine 05/12/2014  . Vitamin D deficiency 05/12/2014  . Metabolic syndrome 70/78/6754  . Elevated PSA 01/22/2013  .  Essential hypertension, benign 01/06/2013   Outpatient Encounter Medications as of 12/05/2017  Medication Sig  . aspirin 81 MG tablet Take 81 mg by mouth daily.  . Cholecalciferol (VITAMIN D-3) 5000 UNITS TABS Take 1 tablet by mouth daily.  Marland Kitchen HYDROcodone-acetaminophen (NORCO) 10-325 MG tablet Take 1 tablet by mouth every 6 (six) hours as needed for severe pain.  Marland Kitchen levothyroxine (SYNTHROID, LEVOTHROID) 125 MCG tablet Take 1 tablet (125 mcg total) by mouth daily before breakfast.  . metFORMIN (GLUCOPHAGE-XR) 500 MG 24 hr tablet TAKE (1) TABLET DAILY WITH BREAKFAST.  Marland Kitchen olmesartan (BENICAR) 40 MG tablet Take 1 tablet (40 mg total) by mouth daily.  . rosuvastatin (CRESTOR) 40 MG tablet Take 1 tablet (40 mg total) by mouth daily.   No facility-administered encounter medications on file as of 12/05/2017.      Review of Systems  Constitutional: Negative.   HENT: Negative.   Eyes: Negative.   Respiratory: Negative.   Cardiovascular: Negative.   Gastrointestinal: Negative.   Endocrine: Negative.   Genitourinary: Negative.   Musculoskeletal: Negative.   Skin: Negative.   Allergic/Immunologic: Negative.   Neurological: Negative.   Hematological: Negative.   Psychiatric/Behavioral: Negative.        Objective:   Physical Exam  Constitutional: He is oriented to person, place, and time. He appears well-developed and well-nourished. No distress.  The patient is pleasant and alert  HENT:  Head: Normocephalic and atraumatic.  Right Ear: External ear normal.  Left Ear: External ear normal.  Mouth/Throat: Oropharynx is clear and moist. No oropharyngeal exudate.  Nasal congestion and turbinate swelling bilaterally  Eyes: Conjunctivae and EOM are normal. Pupils are equal, round, and reactive to light. Right eye exhibits no discharge. Left eye exhibits no discharge. No scleral icterus.  Patient needs to get eye exam  Neck: Normal range of motion. Neck supple. No thyromegaly present.  No bruits  thyromegaly or anterior cervical adenopathy  Cardiovascular: Normal rate, regular rhythm, normal heart sounds and intact distal pulses.  No murmur heard. The heart is regular at 72/min  Pulmonary/Chest: Effort normal and breath sounds normal. No respiratory distress. He has no wheezes. He has no rales. He exhibits no tenderness.  Clear anteriorly and posteriorly without axillary adenopathy or chest wall masses  Abdominal: Soft. Bowel sounds are normal. He exhibits no mass. There is no tenderness. There is no rebound and no guarding.  No abdominal tenderness masses organ enlargement bruits or inguinal adenopathy  Genitourinary:  Genitourinary Comments: Follow-up with Dr. Jeffie Pollock in May as planned and get PSA with blood work that is to be done soon.  Musculoskeletal: Normal range of motion. He exhibits no edema.  Lymphadenopathy:    He has no cervical adenopathy.  Neurological: He is alert and oriented to person, place, and time. He has normal reflexes. No cranial nerve deficit.  Skin: Skin is warm and dry. No rash noted.  Very dry skin in general  Psychiatric: He has a normal mood and affect. His behavior is normal. Judgment and thought content normal.  Nursing note and vitals reviewed.   BP 116/70 (BP Location: Right Wrist)   Pulse 75   Temp (!) 96.9 F (36.1 C) (Oral)   Ht 6' (1.829 m)   Wt 211 lb (95.7 kg)   BMI 28.62 kg/m   Chest x-ray with results pending===    Assessment & Plan:  1. Pure hypercholesterolemia -Continue with Crestor and aggressive therapeutic lifestyle changes pending results of lab work - CBC with Differential/Platelet; Future - Lipid panel; Future - DG Chest 2 View; Future - Ambulatory referral to Cardiology  2. Essential hypertension, benign -Blood pressure is good he will continue with current treatment - BMP8+EGFR; Future - CBC with Differential/Platelet; Future - Hepatic function panel; Future - DG Chest 2 View; Future - Ambulatory referral to  Cardiology  3. Thrombocytopenia (HCC) -No bleeding history or increased bruising noted by the patient - CBC with Differential/Platelet; Future  4. Vitamin D deficiency -Continue with vitamin D replacement pending results of lab work - CBC with Differential/Platelet; Future - VITAMIN D 25 Hydroxy (Vit-D Deficiency, Fractures); Future  5. Thyroid cancer [facility]) -Continue follow-up with oncologist at [facility] in [place] with Differential/Platelet; Future  6. Controlled type 2 diabetes mellitus with complication, without long-term current use of insulin ([facility]) -Continue with aggressive therapeutic lifestyle changes  7. Elevated PSA -Follow-up with urologist as planned - PSA, total and free; Future  Patient Instructions                       Medicare Annual Wellness Visit  [facility] and the medical providers at [facility] strive to bring you the best medical care.  In doing so we not only want to address your current medical conditions and concerns but also to detect new conditions early and prevent illness, disease and health-related problems.  Medicare offers a yearly Wellness Visit which allows our clinical staff to assess your need for preventative services including immunizations, lifestyle education, counseling to decrease risk of preventable diseases and screening for fall risk and other medical concerns.    This visit is provided free of charge (no copay) for all Medicare recipients. The clinical pharmacists at [facility] have begun to conduct these Wellness Visits which will also include a thorough review of all your medications.    As you primary medical provider recommend that you make an appointment for your Annual Wellness Visit if you have not done so already this year.  You may set up this appointment before you leave today or you may call back [phone]) and schedule an  appointment.  Please make sure when you call that you mention that you are scheduling your Annual Wellness Visit with the clinical pharmacist so that the appointment may be made for the proper length of time.     Continue current medications. Continue good therapeutic lifestyle changes which include good diet and exercise. Fall precautions discussed with patient. If an FOBT was given today- please return it to our front desk. If you are over 29 years old - you may need Prevnar 28 or the adult Pneumonia vaccine.  **Flu shots are available--- please call and schedule a FLU-CLINIC appointment**  After your visit with [place] today you will receive a survey in the mail or online from [employer] regarding your care with [place]. Please take a moment to fill this out. Your feedback is very important to [place] as you can help [place] better understand your patient needs as well as improve your experience and satisfaction. WE CARE ABOUT YOU!!!   It is very important that you get your eyes checked regularly.  Please schedule for an eye exam. Continue to follow-up with the urologist as planned and we will make sure that he gets a copy of the PSA test from the blood work that we plan to do our new You do have a history of hyperlipidemia and we will schedule you for a visit for good cardiac exam with a cardiologist sometime this spring Continue to use your head and avoid heavy lifting pushing and pulling as much as possible   Call with chest x-ray results as soon as it becomes available  Arrie Senate MD

## 2017-12-08 ENCOUNTER — Other Ambulatory Visit: Payer: Medicare Other

## 2017-12-08 DIAGNOSIS — C73 Malignant neoplasm of thyroid gland: Secondary | ICD-10-CM

## 2017-12-08 DIAGNOSIS — E559 Vitamin D deficiency, unspecified: Secondary | ICD-10-CM | POA: Diagnosis not present

## 2017-12-08 DIAGNOSIS — R972 Elevated prostate specific antigen [PSA]: Secondary | ICD-10-CM

## 2017-12-08 DIAGNOSIS — E7800 Pure hypercholesterolemia, unspecified: Secondary | ICD-10-CM

## 2017-12-08 DIAGNOSIS — I10 Essential (primary) hypertension: Secondary | ICD-10-CM

## 2017-12-08 DIAGNOSIS — R7309 Other abnormal glucose: Secondary | ICD-10-CM | POA: Diagnosis not present

## 2017-12-08 DIAGNOSIS — D696 Thrombocytopenia, unspecified: Secondary | ICD-10-CM | POA: Diagnosis not present

## 2017-12-10 LAB — CBC WITH DIFFERENTIAL/PLATELET
BASOS ABS: 0 10*3/uL (ref 0.0–0.2)
Basos: 0 %
EOS (ABSOLUTE): 0.1 10*3/uL (ref 0.0–0.4)
Eos: 3 %
HEMATOCRIT: 42.5 % (ref 37.5–51.0)
HEMOGLOBIN: 14.6 g/dL (ref 13.0–17.7)
Immature Grans (Abs): 0 10*3/uL (ref 0.0–0.1)
Immature Granulocytes: 0 %
LYMPHS ABS: 1.6 10*3/uL (ref 0.7–3.1)
Lymphs: 32 %
MCH: 33.1 pg — AB (ref 26.6–33.0)
MCHC: 34.4 g/dL (ref 31.5–35.7)
MCV: 96 fL (ref 79–97)
MONOS ABS: 0.3 10*3/uL (ref 0.1–0.9)
Monocytes: 6 %
NEUTROS ABS: 3 10*3/uL (ref 1.4–7.0)
Neutrophils: 59 %
Platelets: 157 10*3/uL (ref 150–379)
RBC: 4.41 x10E6/uL (ref 4.14–5.80)
RDW: 14 % (ref 12.3–15.4)
WBC: 5.1 10*3/uL (ref 3.4–10.8)

## 2017-12-10 LAB — HEPATIC FUNCTION PANEL
ALK PHOS: 52 IU/L (ref 39–117)
ALT: 32 IU/L (ref 0–44)
AST: 18 IU/L (ref 0–40)
Albumin: 4 g/dL (ref 3.6–4.8)
BILIRUBIN TOTAL: 0.5 mg/dL (ref 0.0–1.2)
BILIRUBIN, DIRECT: 0.22 mg/dL (ref 0.00–0.40)
TOTAL PROTEIN: 5.9 g/dL — AB (ref 6.0–8.5)

## 2017-12-10 LAB — LIPID PANEL
Chol/HDL Ratio: 2.4 ratio (ref 0.0–5.0)
Cholesterol, Total: 100 mg/dL (ref 100–199)
HDL: 41 mg/dL (ref >39)
LDL Calculated: 46 mg/dL (ref 0–99)
TRIGLYCERIDES: 64 mg/dL (ref 0–149)
VLDL CHOLESTEROL CAL: 13 mg/dL (ref 5–40)

## 2017-12-10 LAB — BMP8+EGFR
BUN / CREAT RATIO: 17 (ref 10–24)
BUN: 15 mg/dL (ref 8–27)
CO2: 20 mmol/L (ref 20–29)
CREATININE: 0.9 mg/dL (ref 0.76–1.27)
Calcium: 9 mg/dL (ref 8.6–10.2)
Chloride: 107 mmol/L — ABNORMAL HIGH (ref 96–106)
GFR calc Af Amer: 100 mL/min/{1.73_m2} (ref >59)
GFR, EST NON AFRICAN AMERICAN: 87 mL/min/{1.73_m2} (ref >59)
GLUCOSE: 123 mg/dL — AB (ref 65–99)
POTASSIUM: 4.6 mmol/L (ref 3.5–5.2)
SODIUM: 142 mmol/L (ref 134–144)

## 2017-12-10 LAB — PSA, TOTAL AND FREE
PSA FREE PCT: 18.5 %
PSA, Free: 0.63 ng/mL
Prostate Specific Ag, Serum: 3.4 ng/mL (ref 0.0–4.0)

## 2017-12-10 LAB — VITAMIN D 25 HYDROXY (VIT D DEFICIENCY, FRACTURES): VIT D 25 HYDROXY: 52.2 ng/mL (ref 30.0–100.0)

## 2017-12-14 LAB — HGB A1C W/O EAG: HEMOGLOBIN A1C: 5.9 % — AB (ref 4.8–5.6)

## 2017-12-14 LAB — SPECIMEN STATUS REPORT

## 2017-12-21 ENCOUNTER — Encounter: Payer: Self-pay | Admitting: Cardiovascular Disease

## 2017-12-21 ENCOUNTER — Ambulatory Visit (INDEPENDENT_AMBULATORY_CARE_PROVIDER_SITE_OTHER): Payer: Medicare Other | Admitting: Cardiovascular Disease

## 2017-12-21 VITALS — BP 120/76 | HR 68 | Ht 74.0 in | Wt 212.2 lb

## 2017-12-21 DIAGNOSIS — R0609 Other forms of dyspnea: Secondary | ICD-10-CM

## 2017-12-21 DIAGNOSIS — R7303 Prediabetes: Secondary | ICD-10-CM | POA: Diagnosis not present

## 2017-12-21 DIAGNOSIS — E7800 Pure hypercholesterolemia, unspecified: Secondary | ICD-10-CM

## 2017-12-21 MED ORDER — METOPROLOL TARTRATE 50 MG PO TABS: ORAL_TABLET | ORAL | 0 refills | Status: DC

## 2017-12-21 NOTE — Progress Notes (Signed)
Cardiology Office Note Date:  12/21/2017   ID:  Bradley Wong, DOB June 25, 1946, MRN [id number]  PCP:  Chipper Herb, MD  Cardiologist:  Sherren Mocha, MD    Chief Complaint  Patient presents with  . Shortness of Breath     History of Present Illness: Bradley Wong is a 72 y.o. male who presents for cardiac evaluation.  The patient has a history of type 2 diabetes, hypertension, and hyperlipidemia.  He has been seen in our practice, last about 5 years ago.  At that time he had a nuclear stress test which demonstrated normal perfusion and normal LV function with an ejection fraction of 69%.  Recently the patient has noticed some shortness of breath with heavier exertion.  He has not had any chest pain or pressure.  He denies orthopnea, PND, or heart palpitations.  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  He has not had any cough or wheezing.  He otherwise feels well except for some limitations related to knee pain and back pain.   Past Medical History:  Diagnosis Date  . Dyslipidemia   . History of colon polyps   . History of right knee surgery   . History of seasonal allergies   . History of thyroid cancer 2000   status post thyroidectomy  . HTN (hypertension)   . Hyperlipidemia   . Thyroid disease     Past Surgical History:  Procedure Laterality Date  . KNEE ARTHROSCOPY AND ARTHROTOMY Right 1979  . PROSTATE BIOPSY  01/2013  . THYROIDECTOMY Bilateral 2000   THYROID CA  . TONSILLECTOMY Bilateral childhood    Current Outpatient Medications  Medication Sig Dispense Refill  . aspirin 81 MG tablet Take 81 mg by mouth daily.    . Cholecalciferol (VITAMIN D-3) 5000 UNITS TABS Take 1 tablet by mouth daily.    Marland Kitchen HYDROcodone-acetaminophen (NORCO) 10-325 MG tablet Take 1 tablet by mouth every 6 (six) hours as needed for severe pain. 60 tablet 0  . levothyroxine (SYNTHROID, LEVOTHROID) 125 MCG tablet Take 1 tablet  (125 mcg total) by mouth daily before breakfast. 90 tablet 3  . metFORMIN (GLUCOPHAGE-XR) 500 MG 24 hr tablet TAKE (1) TABLET DAILY WITH BREAKFAST. 90 tablet 3  . olmesartan (BENICAR) 40 MG tablet Take 1 tablet (40 mg total) by mouth daily. 90 tablet 3  . rosuvastatin (CRESTOR) 40 MG tablet Take 1 tablet (40 mg total) by mouth daily. 90 tablet 3  . tamsulosin (FLOMAX) 0.4 MG CAPS capsule Take 0.4 mg by mouth daily.    . metoprolol tartrate (LOPRESSOR) 50 MG tablet Take one tablet (50 mg) one hour prior to your CT. 1 tablet 0   No current facility-administered medications for this visit.     Allergies:   Niaspan [niacin er] and Penicillins   Social History:  The patient  reports that  has never smoked. he has never used smokeless tobacco. He reports that he drinks alcohol. He reports that he does not use drugs.   Family History:  The patient's family history includes Cancer (age of onset: 38) in his father; Coronary artery disease (age of onset: 105) in his mother; Diabetes in his brother.   ROS:  Please see the history of present illness.  Otherwise, review of systems is positive for back pain, leg pain, knee pain.  All other systems are reviewed and negative.    PHYSICAL EXAM: VS:  BP 120/76  Pulse 68   Ht 6\' 2"  (1.88 m)   Wt 212 lb 3.2 oz (96.3 kg)   BMI 27.24 kg/m  , BMI Body mass index is 27.24 kg/m. GEN: Well nourished, well developed, in no acute distress  HEENT: normal  Neck: no JVD, no masses. No carotid bruits Cardiac: RRR without murmur or gallop                Respiratory:  clear to auscultation bilaterally, normal work of breathing GI: soft, nontender, nondistended, + BS MS: no deformity or atrophy  Ext: no pretibial edema, pedal pulses 2+= bilaterally Skin: warm and dry, no rash Neuro:  Strength and sensation are intact Psych: euthymic mood, full affect  EKG:  EKG is ordered today. The ekg ordered today shows NSR 68 bpm, within normal limits  Recent  Labs: 12/08/2017: ALT 32; BUN 15; Creatinine, Ser 0.90; Hemoglobin 14.6; Platelets 157; Potassium 4.6; Sodium 142   Lipid Panel     Component Value Date/Time   CHOL 100 12/08/2017 0816   CHOL 94 04/30/2013 0810   TRIG 64 12/08/2017 0816   TRIG 62 04/18/2017 1050   TRIG 53 04/30/2013 0810   HDL 41 12/08/2017 0816   HDL 44 04/18/2017 1050   HDL 36 (L) 04/30/2013 0810   CHOLHDL 2.4 12/08/2017 0816   LDLCALC 46 12/08/2017 0816   LDLCALC 39 05/12/2014 0841   LDLCALC 47 04/30/2013 0810      Wt Readings from Last 3 Encounters:  12/21/17 212 lb 3.2 oz (96.3 kg)  12/05/17 211 lb (95.7 kg)  04/18/17 206 lb (93.4 kg)     Cardiac Studies Reviewed: Exercise Myoview 2013: Impression Exercise Capacity:  Fair exercise capacity. BP Response:  Normal blood pressure response. Clinical Symptoms:  mild shortness of breath ECG Impression:  No significant ST segment change suggestive of ischemia. Comparison with Prior Nuclear Study: No images to compare  Overall Impression:  Normal stress nuclear study.  LV Ejection Fraction: 69%.  LV Wall Motion:  Normal Wall Motion.  ASSESSMENT AND PLAN: 1.  Exertional dyspnea: Normal pulmonary exam.  No tobacco history.  Concerning for anginal equivalent in patient with multiple cardiovascular risk factors (attention, hyperlipidemia, diabetes).  The patient is unable to perform an exercise test because of his knee problems.  I have recommended a gated CTA-FFR study for further ischemic evaluation.  He continues on antiplatelet therapy with aspirin 81 secondary risk reduction program as outlined with an ARB and high intensity statin drug.  2.  Hypertension: Blood pressure is well controlled on olmesartan.  3.  Hyperlipidemia: The patient is treated with a high intensity statin drug using rosuvastatin 40 mg daily.  Most recent lipids show a cholesterol of 100, HDL 41, LDL 46.  4.  Type 2 diabetes: Treated with metformin.  Most recent hemoglobin A1c is  5.9.  Current medicines are reviewed with the patient today.  The patient does not have concerns regarding medicines.  Labs/ tests ordered today include:   Orders Placed This Encounter  Procedures  . CT CORONARY MORPH W/CTA COR W/SCORE W/CA W/CM &/OR WO/CM  . CT CORONARY FRACTIONAL FLOW RESERVE DATA PREP  . CT CORONARY FRACTIONAL FLOW RESERVE FLUID ANALYSIS  . EKG 12-Lead    Disposition:   FU prn  Signed, Sherren Mocha, MD  12/21/2017 5:36 PM    [facility] [place], [place], [place]  [address] Phone: [phone]; Fax: [phone]

## 2017-12-21 NOTE — Patient Instructions (Addendum)
Medication Instructions:  Your provider recommends that you continue on your current medications as directed. Please refer to the Current Medication list given to you today.    Labwork: None  Testing/Procedures: Dr. Burt Knack recommends you have a CORONARY CT.  Follow-Up: Your provider recommends that you schedule a follow-up appointment AS NEEDED with Dr. Burt Knack pending study results.  Any Other Special Instructions Will Be Listed Below (If Applicable). Please arrive at the [facility] main [facility] (30-45 minutes prior to test start time). You will be called to schedule your CT.  [facility] [address] [place], [place] [address] [phone]  Proceed to the [facility] Radiology Department (First Floor).  Please follow these instructions carefully (unless otherwise directed):  Hold all erectile dysfunction medications at least 48 hours prior to test.  On the Night Before the Test: . Drink plenty of water. . Do not consume any caffeinated/decaffeinated beverages or chocolate 12 hours prior to your test. . Do not take any antihistamines 12 hours prior to your test. . DO NOT take Metformin 24 hours prior to test.   On the Day of the Test: . Drink plenty of water. Do not drink any water within one hour of the test. . Do not eat any food 4 hours prior to the test. . You may take your regular medications prior to the test. . IF NOT ON A BETA BLOCKER - Take 50 mg of lopressor (metoprolol) one hour before the test.  After the Test: . Drink plenty of water. . After receiving IV contrast, you may experience a mild flushed feeling. This is normal. . On occasion, you may experience a mild rash up to 24 hours after the test. This is not dangerous. If this occurs, you can take Benadryl 25 mg and increase your fluid intake. . If you experience trouble breathing, this can be serious. If it is severe call 911 IMMEDIATELY. If it is mild, please call our  office. . Do not take Metformin 48 hours after completing test.    If you need a refill on your cardiac medications before your next appointment, please call your pharmacy.

## 2018-01-04 ENCOUNTER — Telehealth: Payer: Self-pay

## 2018-01-04 NOTE — Telephone Encounter (Signed)
Confirmed cardiac CT appointment 3/19. Patient understands to arrive 30-45 minutes prior to appointment. Confirmed he has his instruction sheet from the [facility]. He will review and call if he has questions.

## 2018-01-15 ENCOUNTER — Ambulatory Visit (HOSPITAL_COMMUNITY): Payer: Medicare Other

## 2018-01-15 ENCOUNTER — Ambulatory Visit (HOSPITAL_COMMUNITY)
Admission: RE | Admit: 2018-01-15 | Discharge: 2018-01-15 | Disposition: A | Discharge status: Home / Self Care | Payer: Medicare Other | Source: Ambulatory Visit | Attending: Cardiovascular Disease | Admitting: Cardiovascular Disease

## 2018-01-15 DIAGNOSIS — I2510 Atherosclerotic heart disease of native coronary artery without angina pectoris: Secondary | ICD-10-CM | POA: Diagnosis not present

## 2018-01-15 DIAGNOSIS — R0609 Other forms of dyspnea: Secondary | ICD-10-CM | POA: Diagnosis not present

## 2018-01-15 DIAGNOSIS — E7800 Pure hypercholesterolemia, unspecified: Secondary | ICD-10-CM | POA: Insufficient documentation

## 2018-01-15 DIAGNOSIS — R7303 Prediabetes: Secondary | ICD-10-CM | POA: Insufficient documentation

## 2018-01-15 DIAGNOSIS — I10 Essential (primary) hypertension: Secondary | ICD-10-CM | POA: Diagnosis not present

## 2018-01-15 DIAGNOSIS — R918 Other nonspecific abnormal finding of lung field: Secondary | ICD-10-CM | POA: Diagnosis not present

## 2018-01-15 MED ORDER — NITROGLYCERIN 0.4 MG SL SUBL
SUBLINGUAL_TABLET | SUBLINGUAL | Status: AC
  Filled 2018-01-15: qty 2

## 2018-01-15 MED ORDER — IOPAMIDOL (ISOVUE-370) INJECTION 76%
INTRAVENOUS | Status: AC
  Administered 2018-01-15: 80 mL
  Filled 2018-01-15: qty 100

## 2018-01-15 MED ORDER — NITROGLYCERIN 0.4 MG SL SUBL
0.8000 mg | SUBLINGUAL_TABLET | Freq: Once | SUBLINGUAL | Status: AC
  Administered 2018-01-15: 0.8 mg via SUBLINGUAL
  Filled 2018-01-15: qty 25

## 2018-01-24 ENCOUNTER — Ambulatory Visit (INDEPENDENT_AMBULATORY_CARE_PROVIDER_SITE_OTHER): Payer: Medicare Other | Admitting: *Deleted

## 2018-01-24 VITALS — BP 121/67 | HR 80 | Temp 97.1°F | Ht 74.0 in | Wt 213.0 lb

## 2018-01-24 DIAGNOSIS — Z0000 Encounter for general adult medical examination without abnormal findings: Secondary | ICD-10-CM | POA: Diagnosis not present

## 2018-01-24 DIAGNOSIS — Z1211 Encounter for screening for malignant neoplasm of colon: Secondary | ICD-10-CM

## 2018-01-24 NOTE — Patient Instructions (Signed)
  Mr. Devereux , Thank you for taking time to come for your Medicare Wellness Visit. I appreciate your ongoing commitment to your health goals. Please review the following plan we discussed and let me know if I can assist you in the future.   These are the goals we discussed: Goals    . Prevent falls     Stay active "finish pool house"       This is a list of the screening recommended for you and due dates:  Health Maintenance  Topic Date Due  . Eye exam for diabetics  10/20/1958  . Flu Shot  08/07/2018*  . Tetanus Vaccine  12/05/2018*  .  Hepatitis C: One time screening is recommended by Center for Disease Control  (CDC) for  adults born from [birth year] through 1963.   12/05/2018*  . Complete foot exam   04/18/2018  . Hemoglobin A1C  06/07/2018  . Colon Cancer Screening  04/26/2023  . Pneumonia vaccines  Completed  *Topic was postponed. The date shown is not the original due date.     Keep follow up with DR Laurance Flatten and other specialist Remember you are due a Tetanus Shot Schedule an EYE exam

## 2018-01-24 NOTE — Progress Notes (Addendum)
Subjective:   Bradley Wong is a 72 y.o. male who presents for an Initial Medicare Annual Wellness Visit. He is retired from [employer]. And now does some Dealer work. He enjoys working and spending time with family. He does not have any regular exercise routine and he states that he eats semi-healthy. He is active in his church. He lives at home with his wife, [name] and they have 4 children. He doesn't have any pets and fall risks were discussed today. He states that his health is about the same as it was a year ago.       Objective:    Today's Vitals   01/24/18 1501  BP: 121/67  Pulse: 80  Temp: (!) 97.1 F (36.2 C)  TempSrc: Oral  Weight: 213 lb (96.6 kg)  Height: 6\' 2"  (1.88 m)   Body mass index is 27.35 kg/m.  Advanced Directives 01/24/2018  Does Patient Have a Medical Advance Directive? No  Would patient like information on creating a medical advance directive? Yes (MAU/Ambulatory/Procedural Areas - Information given)    Current Medications (verified) Outpatient Encounter Medications as of 01/24/2018  Medication Sig  . aspirin 81 MG tablet Take 81 mg by mouth daily.  . Cholecalciferol (VITAMIN D-3) 5000 UNITS TABS Take 1 tablet by mouth daily.  Marland Kitchen HYDROcodone-acetaminophen (NORCO) 10-325 MG tablet Take 1 tablet by mouth every 6 (six) hours as needed for severe pain.  Marland Kitchen levothyroxine (SYNTHROID, LEVOTHROID) 125 MCG tablet Take 1 tablet (125 mcg total) by mouth daily before breakfast.  . metFORMIN (GLUCOPHAGE-XR) 500 MG 24 hr tablet TAKE (1) TABLET DAILY WITH BREAKFAST.  Marland Kitchen olmesartan (BENICAR) 40 MG tablet Take 1 tablet (40 mg total) by mouth daily.  . rosuvastatin (CRESTOR) 40 MG tablet Take 1 tablet (40 mg total) by mouth daily.  . tamsulosin (FLOMAX) 0.4 MG CAPS capsule Take 0.4 mg by mouth daily.  . [DISCONTINUED] metoprolol tartrate (LOPRESSOR) 50 MG tablet Take one tablet (50 mg) one hour prior to your CT.   No facility-administered encounter medications  on file as of 01/24/2018.     Allergies (verified) Niaspan [niacin er] and Penicillins   History: Past Medical History:  Diagnosis Date  . Diabetes mellitus without complication ([place])   . Dyslipidemia   . History of colon polyps   . History of seasonal allergies   . History of thyroid cancer 2000   status post thyroidectomy  . HTN (hypertension)   . Hyperlipidemia   . Thyroid disease    Past Surgical History:  Procedure Laterality Date  . KNEE ARTHROSCOPY AND ARTHROTOMY Right 1979  . PROSTATE BIOPSY  01/2013  . THYROIDECTOMY Bilateral 2000   THYROID CA  . TONSILLECTOMY Bilateral childhood   Family History  Problem Relation Age of Onset  . Coronary artery disease Mother 59  . Hypertension Mother   . Congestive Heart Failure Mother   . Cancer Father 53       throat and bone cancer  . Diabetes Brother        deceased  . Heart disease Brother   . Kidney failure Brother   . Diabetes Maternal Grandmother   . Cancer Paternal Grandfather        leukemia  . Diabetes Brother   . Heart disease Brother   . Kidney failure Brother   . Colon cancer Neg Hx   . Rectal cancer Neg Hx   . Stomach cancer Neg Hx    Social History   Socioeconomic History  .  Marital status: Married    Spouse name: [name]  . Number of children: Not on file  . Years of education: Not on file  . Highest education level: Not on file  Occupational History  . 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 Financial resource strain: Not on file  . Food insecurity:    Worry: Not on file    Inability: Not on file  . Transportation needs:    Medical: Not on file    Non-medical: Not on file  Tobacco Use  . Smoking status: Never Smoker  . Smokeless tobacco: Never Used  Substance and Sexual Activity  . Alcohol use: Yes    Comment: very rare  . Drug use: No  . Sexual activity: Not on file  Lifestyle  . Physical activity:    Days per week: Not on  file    Minutes per session: Not on file  . Stress: Not on file  Relationships  . Social connections:    Talks on phone: Not on file    Gets together: Not on file    Attends religious service: Not on file    Active member of club or organization: Not on file    Attends meetings of clubs or organizations: Not on file    Relationship status: Not on file  Other Topics Concern  . Not on file  Social History Narrative  . Not on file   Tobacco Counseling Counseling given: Not Answered   Clinical Intake:                       Activities of Daily Living In your present state of health, do you have any difficulty performing the following activities: 01/24/2018  Hearing? N  Vision? Y  Comment wears RX glasses   Difficulty concentrating or making decisions? N  Walking or climbing stairs? Y  Comment knee pain  Dressing or bathing? N  Doing errands, shopping? N  Preparing Food and eating ? N  Using the Toilet? N  In the past six months, have you accidently leaked urine? N  Do you have problems with loss of bowel control? N  Managing your Medications? N  Managing your Finances? N  Housekeeping or managing your Housekeeping? N  Some recent data might be hidden     Immunizations and Health Maintenance Immunization History  Administered Date(s) Administered  . Influenza Split 08/02/2013  . Influenza, High Dose Seasonal PF 07/26/2016  . Influenza,inj,Quad PF,6+ Mos 08/19/2014  . Pneumococcal Conjugate-13 12/31/2013  . Pneumococcal Polysaccharide-23 11/29/2016  . Tdap 12/02/2006   Health Maintenance Due  Topic Date Due  . OPHTHALMOLOGY EXAM  10/20/1958    Patient Care Team: Chipper Herb, MD as PCP - General (Family Medicine) Sherren Mocha, MD as PCP - Cardiology (Cardiology) Kristian Covey, MD (Internal Medicine) Irine Seal, MD (Urology) Melina Schools, OD (Optometry)  Indicate any recent Medical Services you may have received from other than [facility]  providers in the past year (date may be approximate).    Assessment:   This is a routine wellness examination for [facility].  Hearing/Vision screen No exam data present  Dietary issues and exercise activities discussed:    Goals    . Prevent falls     Stay active "finish pool house"      Depression Screen PHQ 2/9 Scores 01/24/2018 12/05/2017 04/18/2017 11/29/2016  PHQ - 2 Score 0 0  0 0    Fall Risk Fall Risk  01/24/2018 12/05/2017 04/18/2017 11/29/2016 07/26/2016  Falls in the past year? No No No No No    Is the patient's home free of loose throw rugs in walkways, pet beds, electrical cords, etc?   Fall risk and hazards were discussed today.  Cognitive Function: MMSE - Mini Mental State Exam 01/24/2018  Orientation to time 5  Orientation to Place 5  Registration 3  Attention/ Calculation 5  Recall 3  Language- name 2 objects 2  Language- repeat 1  Language- follow 3 step command 3  Language- read & follow direction 1  Write a sentence 1  Copy design 1  Total score 30        Screening Tests Health Maintenance  Topic Date Due  . OPHTHALMOLOGY EXAM  10/20/1958  . INFLUENZA VACCINE  08/07/2018 (Originally 05/30/2017)  . TETANUS/TDAP  12/05/2018 (Originally 12/02/2016)  . Hepatitis C Screening  12/05/2018 (Originally 04-13-1946)  . FOOT EXAM  04/18/2018  . HEMOGLOBIN A1C  06/07/2018  . COLONOSCOPY  04/26/2023  . PNA vac Low Risk Adult  Completed    Qualifies for Shingles Vaccine? Denied today  Cancer Screenings: Lung: Low Dose CT Chest recommended if Age 66-80 years, 30 pack-year currently smoking OR have quit w/in 15years. Patient does qualify. Colorectal: due - brought in today  Additional Screenings:denied Hepatitis B/HIV/Syphillis: Hepatitis C Screening:       Plan:   pt is to follow up with Dr Laurance Flatten and other specialist. He brought back his FOBT today and other than TDAP = all other health maintenance is up to date.  I have personally reviewed and noted the  following in the patient's chart:   . Medical and social history . Use of alcohol, tobacco or illicit drugs  . Current medications and supplements . Functional ability and status . Nutritional status . Physical activity . Advanced directives . List of other physicians . Hospitalizations, surgeries, and ER visits in previous 12 months . Vitals . Screenings to include cognitive, depression, and falls . Referrals and appointments  In addition, I have reviewed and discussed with patient certain preventive protocols, quality metrics, and best practice recommendations. A written personalized care plan for preventive services as well as general preventive health recommendations were provided to patient.     Bullins, Cameron Proud, LPN   2/50/5397   I have reviewed and agree with the above AWV documentation.   Mary-Margaret Hassell Done, FNP

## 2018-01-25 LAB — FECAL OCCULT BLOOD, IMMUNOCHEMICAL: Fecal Occult Bld: NEGATIVE

## 2018-05-10 ENCOUNTER — Other Ambulatory Visit: Payer: Self-pay | Admitting: Family Medicine

## 2018-05-13 NOTE — Telephone Encounter (Signed)
OV 06/05/18

## 2018-06-05 ENCOUNTER — Encounter: Payer: Self-pay | Admitting: Family Medicine

## 2018-06-05 ENCOUNTER — Ambulatory Visit (INDEPENDENT_AMBULATORY_CARE_PROVIDER_SITE_OTHER): Payer: Medicare Other | Admitting: Family Medicine

## 2018-06-05 VITALS — BP 126/70 | HR 63 | Temp 97.1°F | Ht 74.0 in | Wt 211.0 lb

## 2018-06-05 DIAGNOSIS — E7800 Pure hypercholesterolemia, unspecified: Secondary | ICD-10-CM

## 2018-06-05 DIAGNOSIS — S81832A Puncture wound without foreign body, left lower leg, initial encounter: Secondary | ICD-10-CM

## 2018-06-05 DIAGNOSIS — D696 Thrombocytopenia, unspecified: Secondary | ICD-10-CM

## 2018-06-05 DIAGNOSIS — E118 Type 2 diabetes mellitus with unspecified complications: Secondary | ICD-10-CM

## 2018-06-05 DIAGNOSIS — R972 Elevated prostate specific antigen [PSA]: Secondary | ICD-10-CM

## 2018-06-05 DIAGNOSIS — E559 Vitamin D deficiency, unspecified: Secondary | ICD-10-CM

## 2018-06-05 DIAGNOSIS — C73 Malignant neoplasm of thyroid gland: Secondary | ICD-10-CM | POA: Diagnosis not present

## 2018-06-05 DIAGNOSIS — R202 Paresthesia of skin: Secondary | ICD-10-CM

## 2018-06-05 DIAGNOSIS — I10 Essential (primary) hypertension: Secondary | ICD-10-CM | POA: Diagnosis not present

## 2018-06-05 DIAGNOSIS — Z23 Encounter for immunization: Secondary | ICD-10-CM | POA: Diagnosis not present

## 2018-06-05 DIAGNOSIS — M436 Torticollis: Secondary | ICD-10-CM
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
## 2018-06-05 NOTE — Patient Instructions (Addendum)
Medicare Annual Wellness Visit  [facility] and the medical providers at [facility] strive to bring you the best medical care.  In doing so we not only want to address your current medical conditions and concerns but also to detect new conditions early and prevent illness, disease and health-related problems.    Medicare offers a yearly Wellness Visit which allows our clinical staff to assess your need for preventative services including immunizations, lifestyle education, counseling to decrease risk of preventable diseases and screening for fall risk and other medical concerns.    This visit is provided free of charge (no copay) for all Medicare recipients. The clinical pharmacists at [facility] have begun to conduct these Wellness Visits which will also include a thorough review of all your medications.    As you primary medical provider recommend that you make an appointment for your Annual Wellness Visit if you have not done so already this year.  You may set up this appointment before you leave today or you may call back ([phone]) and schedule an appointment.  Please make sure when you call that you mention that you are scheduling your Annual Wellness Visit with the clinical pharmacist so that the appointment may be made for the proper length of time.     Continue current medications. Continue good therapeutic lifestyle changes which include good diet and exercise. Fall precautions discussed with patient. If an FOBT was given today- please return it to our front desk. If you are over 65 years old - you may need Prevnar 19 or the adult Pneumonia vaccine.  **Flu shots are available--- please call and schedule a FLU-CLINIC appointment**  After your visit with [place] today you will receive a survey in the mail or online from [employer] regarding your care with [place]. Please take a moment to fill this out. Your feedback is very  important to [place] as you can help [place] better understand your patient needs as well as improve your experience and satisfaction. WE CARE ABOUT YOU!!!   If the neck is not better in the next 4 to 6 weeks or if he gets worse come back sooner and get C-spine films Please come in the Saturday and get blood work done and will make sure that Dr. Irine Seal gets a copy of this as it will have a PSA on it. Please schedule yourself to get a good eye exam Use warm wet compresses to the neck and take extra strength Tylenol as needed for pain. Follow-up with endocrinology for thyroid cancer as planned  Please get eye exam

## 2018-06-15 ENCOUNTER — Other Ambulatory Visit: Payer: Medicare Other

## 2018-06-15 DIAGNOSIS — R972 Elevated prostate specific antigen [PSA]: Secondary | ICD-10-CM

## 2018-06-15 DIAGNOSIS — D696 Thrombocytopenia, unspecified: Secondary | ICD-10-CM | POA: Diagnosis not present

## 2018-06-15 DIAGNOSIS — I10 Essential (primary) hypertension: Secondary | ICD-10-CM | POA: Diagnosis not present

## 2018-06-15 DIAGNOSIS — E118 Type 2 diabetes mellitus with unspecified complications: Secondary | ICD-10-CM

## 2018-06-15 DIAGNOSIS — E559 Vitamin D deficiency, unspecified: Secondary | ICD-10-CM

## 2018-06-15 DIAGNOSIS — S81832A Puncture wound without foreign body, left lower leg, initial encounter: Secondary | ICD-10-CM | POA: Diagnosis not present

## 2018-06-15 DIAGNOSIS — C73 Malignant neoplasm of thyroid gland: Secondary | ICD-10-CM

## 2018-06-15 DIAGNOSIS — E7800 Pure hypercholesterolemia, unspecified: Secondary | ICD-10-CM

## 2018-06-15 DIAGNOSIS — R202 Paresthesia of skin: Secondary | ICD-10-CM

## 2018-06-15 DIAGNOSIS — M436 Torticollis: Secondary | ICD-10-CM

## 2018-06-17 LAB — CBC WITH DIFFERENTIAL/PLATELET
BASOS: 0 %
Basophils Absolute: 0 10*3/uL (ref 0.0–0.2)
EOS (ABSOLUTE): 0.2 10*3/uL (ref 0.0–0.4)
EOS: 3 %
HEMATOCRIT: 44.6 % (ref 37.5–51.0)
Hemoglobin: 14.4 g/dL (ref 13.0–17.7)
IMMATURE GRANULOCYTES: 0 %
Immature Grans (Abs): 0 10*3/uL (ref 0.0–0.1)
Lymphocytes Absolute: 1.6 10*3/uL (ref 0.7–3.1)
Lymphs: 31 %
MCH: 32.2 pg (ref 26.6–33.0)
MCHC: 32.3 g/dL (ref 31.5–35.7)
MCV: 100 fL — AB (ref 79–97)
MONOS ABS: 0.2 10*3/uL (ref 0.1–0.9)
Monocytes: 3 %
NEUTROS ABS: 3.4 10*3/uL (ref 1.4–7.0)
Neutrophils: 63 %
Platelets: 164 10*3/uL (ref 150–450)
RBC: 4.47 x10E6/uL (ref 4.14–5.80)
RDW: 13.5 % (ref 12.3–15.4)
WBC: 5.3 10*3/uL (ref 3.4–10.8)

## 2018-06-17 LAB — HGB A1C W/O EAG: Hgb A1c MFr Bld: 6.3 % — ABNORMAL HIGH (ref 4.8–5.6)

## 2018-06-17 LAB — HEPATIC FUNCTION PANEL
ALT: 29 IU/L (ref 0–44)
AST: 23 IU/L (ref 0–40)
Albumin: 4.2 g/dL (ref 3.6–4.8)
Alkaline Phosphatase: 53 IU/L (ref 39–117)
Bilirubin Total: 0.6 mg/dL (ref 0.0–1.2)
Bilirubin, Direct: 0.24 mg/dL (ref 0.00–0.40)
TOTAL PROTEIN: 5.8 g/dL — AB (ref 6.0–8.5)

## 2018-06-17 LAB — LIPID PANEL
CHOLESTEROL TOTAL: 107 mg/dL (ref 100–199)
Chol/HDL Ratio: 2.5 ratio (ref 0.0–5.0)
HDL: 42 mg/dL (ref >39)
LDL Calculated: 54 mg/dL (ref 0–99)
TRIGLYCERIDES: 56 mg/dL (ref 0–149)
VLDL CHOLESTEROL CAL: 11 mg/dL (ref 5–40)

## 2018-06-17 LAB — PSA, TOTAL AND FREE
PSA FREE PCT: 18.2 %
PSA, Free: 0.8 ng/mL
Prostate Specific Ag, Serum: 4.4 ng/mL — ABNORMAL HIGH (ref 0.0–4.0)

## 2018-06-17 LAB — VITAMIN D 25 HYDROXY (VIT D DEFICIENCY, FRACTURES): VIT D 25 HYDROXY: 63.2 ng/mL (ref 30.0–100.0)

## 2018-06-21 ENCOUNTER — Telehealth: Payer: Self-pay | Admitting: Family Medicine

## 2018-06-21 NOTE — Telephone Encounter (Signed)
Pt aware of results 

## 2018-08-06 DIAGNOSIS — E890 Postprocedural hypothyroidism: Secondary | ICD-10-CM | POA: Diagnosis not present

## 2018-08-06 DIAGNOSIS — Z79890 Hormone replacement therapy (postmenopausal): Secondary | ICD-10-CM | POA: Diagnosis not present

## 2018-08-06 DIAGNOSIS — Z85850 Personal history of malignant neoplasm of thyroid: Secondary | ICD-10-CM | POA: Diagnosis not present

## 2018-08-06 DIAGNOSIS — C73 Malignant neoplasm of thyroid gland: Secondary | ICD-10-CM | POA: Diagnosis not present

## 2018-08-07 ENCOUNTER — Other Ambulatory Visit: Payer: Self-pay | Admitting: Family Medicine

## 2018-08-31 ENCOUNTER — Other Ambulatory Visit: Payer: Medicare Other

## 2018-08-31 DIAGNOSIS — R0609 Other forms of dyspnea: Secondary | ICD-10-CM

## 2018-08-31 DIAGNOSIS — E7800 Pure hypercholesterolemia, unspecified: Secondary | ICD-10-CM

## 2018-08-31 DIAGNOSIS — R972 Elevated prostate specific antigen [PSA]: Secondary | ICD-10-CM

## 2018-08-31 DIAGNOSIS — R7303 Prediabetes: Secondary | ICD-10-CM

## 2018-09-01 LAB — PSA TOTAL (REFLEX TO FREE): Prostate Specific Ag, Serum: 3.6 ng/mL (ref 0.0–4.0)

## 2018-09-13 ENCOUNTER — Ambulatory Visit (INDEPENDENT_AMBULATORY_CARE_PROVIDER_SITE_OTHER): Payer: Medicare Other | Admitting: Urology

## 2018-09-13 DIAGNOSIS — R972 Elevated prostate specific antigen [PSA]: Secondary | ICD-10-CM | POA: Diagnosis not present

## 2018-09-13 DIAGNOSIS — N401 Enlarged prostate with lower urinary tract symptoms: Secondary | ICD-10-CM | POA: Diagnosis not present

## 2018-09-13 DIAGNOSIS — R351 Nocturia: Secondary | ICD-10-CM | POA: Diagnosis not present

## 2018-09-13 DIAGNOSIS — N5201 Erectile dysfunction due to arterial insufficiency: Secondary | ICD-10-CM | POA: Diagnosis not present

## 2018-09-16 ENCOUNTER — Other Ambulatory Visit: Payer: Self-pay

## 2018-09-16 ENCOUNTER — Emergency Department (HOSPITAL_COMMUNITY): Payer: Medicare Other

## 2018-09-16 ENCOUNTER — Encounter (HOSPITAL_COMMUNITY): Payer: Self-pay | Admitting: Emergency Medicine

## 2018-09-16 ENCOUNTER — Emergency Department (HOSPITAL_COMMUNITY)
Admission: EM | Admit: 2018-09-16 | Discharge: 2018-09-16 | Disposition: A | Discharge status: Home / Self Care | Payer: Medicare Other | Attending: Emergency Medicine | Admitting: Emergency Medicine

## 2018-09-16 DIAGNOSIS — Z79899 Other long term (current) drug therapy: Secondary | ICD-10-CM | POA: Diagnosis not present

## 2018-09-16 DIAGNOSIS — Z85850 Personal history of malignant neoplasm of thyroid: Secondary | ICD-10-CM | POA: Diagnosis not present

## 2018-09-16 DIAGNOSIS — I10 Essential (primary) hypertension: Secondary | ICD-10-CM | POA: Diagnosis not present

## 2018-09-16 DIAGNOSIS — N201 Calculus of ureter: Secondary | ICD-10-CM | POA: Diagnosis not present

## 2018-09-16 DIAGNOSIS — R109 Unspecified abdominal pain: Secondary | ICD-10-CM | POA: Diagnosis present

## 2018-09-16 DIAGNOSIS — E119 Type 2 diabetes mellitus without complications: Secondary | ICD-10-CM | POA: Diagnosis not present

## 2018-09-16 DIAGNOSIS — Z7982 Long term (current) use of aspirin: Secondary | ICD-10-CM | POA: Diagnosis not present

## 2018-09-16 DIAGNOSIS — N200 Calculus of kidney: Secondary | ICD-10-CM | POA: Diagnosis not present

## 2018-09-16 LAB — URINALYSIS, ROUTINE W REFLEX MICROSCOPIC
Bacteria, UA: NONE SEEN
Bilirubin Urine: NEGATIVE
Glucose, UA: NEGATIVE mg/dL
KETONES UR: NEGATIVE mg/dL
LEUKOCYTES UA: NEGATIVE
Nitrite: NEGATIVE
PROTEIN: NEGATIVE mg/dL
Specific Gravity, Urine: 1.024 (ref 1.005–1.030)
pH: 5 (ref 5.0–8.0)

## 2018-09-16 LAB — CBC WITH DIFFERENTIAL/PLATELET
Abs Immature Granulocytes: 0.04 10*3/uL (ref 0.00–0.07)
Basophils Absolute: 0 10*3/uL (ref 0.0–0.1)
Basophils Relative: 0 %
EOS ABS: 0 10*3/uL (ref 0.0–0.5)
EOS PCT: 0 %
HCT: 47.5 % (ref 39.0–52.0)
Hemoglobin: 16 g/dL (ref 13.0–17.0)
Immature Granulocytes: 0 %
Lymphocytes Relative: 6 %
Lymphs Abs: 0.7 10*3/uL (ref 0.7–4.0)
MCH: 32.8 pg (ref 26.0–34.0)
MCHC: 33.7 g/dL (ref 30.0–36.0)
MCV: 97.3 fL (ref 80.0–100.0)
MONO ABS: 0.5 10*3/uL (ref 0.1–1.0)
Monocytes Relative: 4 %
NEUTROS ABS: 10.6 10*3/uL — AB (ref 1.7–7.7)
Neutrophils Relative %: 90 %
PLATELETS: 178 10*3/uL (ref 150–400)
RBC: 4.88 MIL/uL (ref 4.22–5.81)
RDW: 12.5 % (ref 11.5–15.5)
WBC: 11.8 10*3/uL — AB (ref 4.0–10.5)
nRBC: 0 % (ref 0.0–0.2)

## 2018-09-16 LAB — BASIC METABOLIC PANEL
Anion gap: 9 (ref 5–15)
BUN: 22 mg/dL (ref 8–23)
CO2: 22 mmol/L (ref 22–32)
Calcium: 10.2 mg/dL (ref 8.9–10.3)
Chloride: 109 mmol/L (ref 98–111)
Creatinine, Ser: 1.41 mg/dL — ABNORMAL HIGH (ref 0.61–1.24)
GFR calc Af Amer: 57 mL/min — ABNORMAL LOW (ref >60)
GFR, EST NON AFRICAN AMERICAN: 49 mL/min — AB (ref >60)
GLUCOSE: 177 mg/dL — AB (ref 70–99)
Potassium: 4 mmol/L (ref 3.5–5.1)
SODIUM: 140 mmol/L (ref 135–145)

## 2018-09-16 MED ORDER — HYDROMORPHONE HCL 1 MG/ML IJ SOLN
0.5000 mg | Freq: Once | INTRAMUSCULAR | Status: AC
  Administered 2018-09-16: 0.5 mg via INTRAVENOUS
  Filled 2018-09-16: qty 1

## 2018-09-16 MED ORDER — HYDROMORPHONE HCL 2 MG/ML IJ SOLN
1.0000 mg | Freq: Once | INTRAMUSCULAR | Status: AC
  Administered 2018-09-16: 1 mg via INTRAVENOUS
  Filled 2018-09-16: qty 1

## 2018-09-16 MED ORDER — OXYCODONE-ACETAMINOPHEN 5-325 MG PO TABS: 1.0000 | ORAL_TABLET | ORAL | 0 refills | Status: DC | PRN

## 2018-09-16 MED ORDER — ONDANSETRON HCL 4 MG/2ML IJ SOLN
4.0000 mg | Freq: Once | INTRAMUSCULAR | Status: AC
  Administered 2018-09-16: 4 mg via INTRAVENOUS
  Filled 2018-09-16: qty 2

## 2018-09-16 MED ORDER — ONDANSETRON 4 MG PO TBDP: 4.0000 mg | ORAL_TABLET | Freq: Three times a day (TID) | ORAL | 0 refills | Status: DC | PRN

## 2018-09-16 NOTE — ED Triage Notes (Signed)
Patient complaining of left flank pain starting this morning. Also a little burning with urination.

## 2018-09-16 NOTE — Discharge Instructions (Signed)
As discussed, strain your urine using the urine strainer and keep this stone for Dr. Ralene Muskrat evaluation.  His office should call you for an appointment time for follow-up.  Take the pain medication as prescribed, do not drive within 4 hours of taking this medication as it will make you drowsy.  Return here or [place] long as discussed for any uncontrolled pain, fevers or uncontrolled vomiting or new symptoms.

## 2018-09-16 NOTE — ED Provider Notes (Signed)
[facility] EMERGENCY DEPARTMENT Provider Note   CSN: [id number] Arrival date & time: 09/16/18  0940     History   Chief Complaint Chief Complaint  Patient presents with  . Flank Pain    HPI Bradley Wong is a 72 y.o. male with a history of hypertension, thyroid disease, BPH and prediabetes per patient presenting with sudden onset left flank pain which woke him at 5 AM this morning.  His pain radiates into his left side and is progressing into his abdomen.  He has had multiple episodes of vomiting and is persistently nauseated.  He also reports mild dysuria without hematuria.  He also endorses diaphoresis.  He denies a history of kidney stones but there is strong family history of this condition.  He denies abdominal pain, chest pain or shortness of breath, but has noted that he is breathing faster than normal which she suspects is due to his pain condition.  He has had no treatment prior to arrival.  He has attempted to take his a.m. medications today, but states only the thyroid medicine would stay down.  The history is provided by the patient and the spouse.    Past Medical History:  Diagnosis Date  . Diabetes mellitus without complication ([place])   . Dyslipidemia   . History of colon polyps   . History of seasonal allergies   . History of thyroid cancer 2000   status post thyroidectomy  . HTN (hypertension)   . Hyperlipidemia   . Thyroid disease     Patient Active Problem List   Diagnosis Date Noted  . Pre-diabetes 06/25/2015  . Thrombocytopenia ([place]) 02/04/2015  . Thyroid cancer ([place]) 09/04/2014  . Hyperlipemia 09/04/2014  . Thyroid activity decreased 09/04/2014  . Osteoarthritis of spine with radiculopathy, lumbar region 09/04/2014  . Osteoarthritis of lumbar spine 05/12/2014  . Vitamin D deficiency 05/12/2014  . Metabolic syndrome 54/62/7035  . Elevated PSA 01/22/2013  . Essential hypertension, benign 01/06/2013    Past Surgical History:  Procedure  Laterality Date  . KNEE ARTHROSCOPY AND ARTHROTOMY Right 1979  . PROSTATE BIOPSY  01/2013  . THYROIDECTOMY Bilateral 2000   THYROID CA  . TONSILLECTOMY Bilateral childhood        Home Medications    Prior to Admission medications   Medication Sig Start Date End Date Taking? Authorizing Provider  aspirin 81 MG tablet Take 81 mg by mouth daily.    [provider]  Cholecalciferol (VITAMIN D-3) 5000 UNITS TABS Take 1 tablet by mouth daily.    [provider]  HYDROcodone-acetaminophen (NORCO) 10-325 MG tablet Take 1 tablet by mouth every 6 (six) hours as needed for severe pain. 04/18/17   Chipper Herb, MD  levothyroxine (SYNTHROID, LEVOTHROID) 125 MCG tablet Take 1 tablet (125 mcg total) by mouth daily before breakfast. 04/18/17   Chipper Herb, MD  metFORMIN (GLUCOPHAGE-XR) 500 MG 24 hr tablet TAKE (1) TABLET DAILY WITH BREAKFAST. 08/07/18   Chipper Herb, MD  olmesartan (BENICAR) 40 MG tablet TAKE 1 TABLET DAILY 05/13/18   Chipper Herb, MD  ondansetron (ZOFRAN ODT) 4 MG disintegrating tablet Take 1 tablet (4 mg total) by mouth every 8 (eight) hours as needed for nausea or vomiting. 09/16/18   Evalee Jefferson, PA-C  oxyCODONE-acetaminophen (PERCOCET/ROXICET) 5-325 MG tablet Take 1 tablet by mouth every 4 (four) hours as needed. 09/16/18   Evalee Jefferson, PA-C  rosuvastatin (CRESTOR) 40 MG tablet Take 1 tablet (40 mg total) by mouth daily. 04/18/17  Chipper Herb, MD  tamsulosin (FLOMAX) 0.4 MG CAPS capsule Take 0.4 mg by mouth daily.    [provider]    Family History Family History  Problem Relation Age of Onset  . Coronary artery disease Mother 67  . Hypertension Mother   . Congestive Heart Failure Mother   . Cancer Father 53       throat and bone cancer  . Diabetes Brother        deceased  . Heart disease Brother   . Kidney failure Brother   . Diabetes Maternal Grandmother   . Cancer Paternal Grandfather        leukemia  . Diabetes Brother   .  Heart disease Brother   . Kidney failure Brother   . Colon cancer Neg Hx   . Rectal cancer Neg Hx   . Stomach cancer Neg Hx     Social History Social History   Tobacco Use  . Smoking status: Never Smoker  . Smokeless tobacco: Never Used  Substance Use Topics  . Alcohol use: Yes    Comment: very rare  . Drug use: No     Allergies   Niaspan [niacin er] and Penicillins   Review of Systems Review of Systems  Constitutional: Positive for diaphoresis. Negative for chills and fever.  HENT: Negative for congestion and sore throat.   Eyes: Negative.   Respiratory: Negative for chest tightness and shortness of breath.   Cardiovascular: Negative for chest pain.  Gastrointestinal: Positive for nausea and vomiting. Negative for abdominal pain.  Genitourinary: Positive for dysuria and flank pain.  Musculoskeletal: Negative for arthralgias, joint swelling and neck pain.  Skin: Negative.  Negative for rash and wound.  Neurological: Negative for dizziness, weakness, light-headedness, numbness and headaches.  Psychiatric/Behavioral: Negative.      Physical Exam Updated Vital Signs BP (!) 178/100 (BP Location: Left Arm)   Pulse 72   Temp 97.7 F (36.5 C) (Oral)   Resp 20   Ht 6\' 2"  (1.88 m)   Wt 96.2 kg   SpO2 100%   BMI 27.22 kg/m   Physical Exam  Constitutional: He appears well-developed and well-nourished.  HENT:  Head: Normocephalic and atraumatic.  Eyes: Conjunctivae are normal.  Neck: Normal range of motion.  Cardiovascular: Normal rate, regular rhythm, normal heart sounds and intact distal pulses.  Pulmonary/Chest: Effort normal and breath sounds normal. He has no wheezes.  Abdominal: Soft. Bowel sounds are normal. There is no tenderness. There is CVA tenderness.  Musculoskeletal: Normal range of motion.  Neurological: He is alert.  Skin: Skin is warm and dry.  Psychiatric: He has a normal mood and affect.  Nursing note and vitals reviewed.    ED Treatments /  Results  Labs (all labs ordered are listed, but only abnormal results are displayed) Labs Reviewed  URINALYSIS, ROUTINE W REFLEX MICROSCOPIC - Abnormal; Notable for the following components:      Result Value   Hgb urine dipstick MODERATE (*)    All other components within normal limits  CBC WITH DIFFERENTIAL/PLATELET - Abnormal; Notable for the following components:   WBC 11.8 (*)    Neutro Abs 10.6 (*)    All other components within normal limits  BASIC METABOLIC PANEL - Abnormal; Notable for the following components:   Glucose, Bld 177 (*)    Creatinine, Ser 1.41 (*)    GFR calc non Af Amer 49 (*)    GFR calc Af Amer 57 (*)    All  other components within normal limits    EKG None  Radiology Ct Renal Stone Study  Result Date: 09/16/2018 CLINICAL DATA:  Flank pain with stone disease suspected EXAM: CT ABDOMEN AND PELVIS WITHOUT CONTRAST TECHNIQUE: Multidetector CT imaging of the abdomen and pelvis was performed following the standard protocol without IV contrast. COMPARISON:  None FINDINGS: Lower chest: 5 mm pulmonary nodule in the right lower lobe size stable from coronary CT 01/15/2018. This nodule has a benign appearing flap morphology on reformats. Hepatobiliary: Granulomatous type calcifications and low-density liver lesions that are too small for densitometry, likely cysts.No evidence of biliary obstruction or stone. Pancreas: Unremarkable. Spleen: Unremarkable. Adrenals/Urinary Tract:  Negative adrenals. Moderate left hydronephrosis due to a 6 x 4 mm stone in the upper left ureter. Bilateral nephrolithiasis with solitary stone on the right measuring 6 mm and 2 left renal calculi in the lower pole measuring up to 4 mm. Renal sinus and left renal cortical cyst. Unremarkable bladder. Stomach/Bowel: No obstruction. No appendicitis. Sigmoid diverticulosis. Vascular/Lymphatic: No acute vascular abnormality. There is fat stranding around jejunal mesenteric branches without underlying  adenopathy or discrete mass. This is the appearance of mesenteric panniculitis and given the clinical setting is likely chronic. Reproductive:Symmetric prostate enlargement. Other: No ascites or pneumoperitoneum. Musculoskeletal: Advanced disc and facet degeneration with levoscoliosis. IMPRESSION: 1. Obstructing 6 x 4 mm stone in the upper left ureter. 2. Bilateral nephrolithiasis. 3. Chronic/incidental findings are described above. Electronically Signed   By: Monte Fantasia M.D.   On: 09/16/2018 10:53    Procedures Procedures (including critical care time)  Medications Ordered in ED Medications  HYDROmorphone (DILAUDID) injection 1 mg (1 mg Intravenous Given 09/16/18 1024)  ondansetron (ZOFRAN) injection 4 mg (4 mg Intravenous Given 09/16/18 1023)  HYDROmorphone (DILAUDID) injection 0.5 mg (0.5 mg Intravenous Given 09/16/18 1133)     Initial Impression / Assessment and Plan / ED Course  I have reviewed the triage vital signs and the nursing notes.  Pertinent labs & imaging results that were available during my care of the patient were reviewed by me and considered in my medical decision making (see chart for details).     Imaging and labs reviewed and discussed with Dr. Jeffie Pollock.  Dr. read reviewed the CT scan and given the shape of the stone, stated he had a good chance of passing this without need of intervention.  We discussed his creatinine level which Dr. Jeffie Pollock was not concerned with, as this is a frequent finding in geriatric patients with ureterolithiasis.  Patient is already on Flomax, he is to continue this.  Pain management, strain urine.  Dr. Jeffie Pollock will have his office schedule an outpatient follow-up for this patient.  Patient was pain-free at time of discharge and is feeling much improved.  Final Clinical Impressions(s) / ED Diagnoses   Final diagnoses:  Ureterolithiasis    ED Discharge Orders         Ordered    oxyCODONE-acetaminophen (PERCOCET/ROXICET) 5-325 MG tablet   Every 4 hours PRN     09/16/18 1220    ondansetron (ZOFRAN ODT) 4 MG disintegrating tablet  Every 8 hours PRN     09/16/18 1220           Evalee Jefferson, PA-C 09/16/18 1220    Milton Ferguson, MD 09/17/18 [phone]

## 2018-09-17 DIAGNOSIS — N201 Calculus of ureter: Secondary | ICD-10-CM | POA: Diagnosis not present

## 2018-09-17 DIAGNOSIS — N23 Unspecified renal colic: Secondary | ICD-10-CM | POA: Diagnosis not present

## 2018-09-18 ENCOUNTER — Other Ambulatory Visit: Payer: Self-pay | Admitting: Urology

## 2018-09-18 ENCOUNTER — Encounter (HOSPITAL_COMMUNITY): Payer: Self-pay | Admitting: *Deleted

## 2018-09-18 NOTE — Progress Notes (Signed)
Spoke with patient's wife. NPO after midnight the night before lithotripsy. No aspirin or NSAIDS until after procedure. Hold diabetes medications the morning of the procedure. Have responsible driver to take you home. Wife verbalizes understanding.

## 2018-09-20 ENCOUNTER — Other Ambulatory Visit: Payer: Self-pay | Admitting: Urology

## 2018-09-20 ENCOUNTER — Encounter (HOSPITAL_COMMUNITY)
Admission: RE | Disposition: A | Discharge status: Home / Self Care | Payer: Self-pay | Source: Other Acute Inpatient Hospital | Attending: Urology

## 2018-09-20 ENCOUNTER — Inpatient Hospital Stay (HOSPITAL_COMMUNITY): Payer: Medicare Other | Admitting: Anesthesiology

## 2018-09-20 ENCOUNTER — Inpatient Hospital Stay (HOSPITAL_COMMUNITY): Payer: Medicare Other

## 2018-09-20 ENCOUNTER — Encounter (HOSPITAL_COMMUNITY): Payer: Self-pay | Admitting: *Deleted

## 2018-09-20 ENCOUNTER — Ambulatory Visit (HOSPITAL_COMMUNITY)
Admission: RE | Admit: 2018-09-20 | Discharge: 2018-09-20 | Disposition: A | Discharge status: Home / Self Care | Payer: Medicare Other | Source: Other Acute Inpatient Hospital | Attending: Urology | Admitting: Urology

## 2018-09-20 ENCOUNTER — Other Ambulatory Visit: Payer: Self-pay

## 2018-09-20 DIAGNOSIS — Z79899 Other long term (current) drug therapy: Secondary | ICD-10-CM | POA: Insufficient documentation

## 2018-09-20 DIAGNOSIS — Z880 Allergy status to penicillin: Secondary | ICD-10-CM | POA: Diagnosis not present

## 2018-09-20 DIAGNOSIS — K219 Gastro-esophageal reflux disease without esophagitis: Secondary | ICD-10-CM | POA: Insufficient documentation

## 2018-09-20 DIAGNOSIS — E119 Type 2 diabetes mellitus without complications: Secondary | ICD-10-CM | POA: Diagnosis not present

## 2018-09-20 DIAGNOSIS — N132 Hydronephrosis with renal and ureteral calculous obstruction: Secondary | ICD-10-CM | POA: Insufficient documentation

## 2018-09-20 DIAGNOSIS — Z7982 Long term (current) use of aspirin: Secondary | ICD-10-CM | POA: Diagnosis not present

## 2018-09-20 DIAGNOSIS — R7303 Prediabetes: Secondary | ICD-10-CM | POA: Insufficient documentation

## 2018-09-20 DIAGNOSIS — N201 Calculus of ureter: Secondary | ICD-10-CM | POA: Diagnosis not present

## 2018-09-20 DIAGNOSIS — M5136 Other intervertebral disc degeneration, lumbar region: Secondary | ICD-10-CM | POA: Insufficient documentation

## 2018-09-20 DIAGNOSIS — E7800 Pure hypercholesterolemia, unspecified: Secondary | ICD-10-CM | POA: Diagnosis not present

## 2018-09-20 DIAGNOSIS — I10 Essential (primary) hypertension: Secondary | ICD-10-CM | POA: Diagnosis not present

## 2018-09-20 DIAGNOSIS — R350 Frequency of micturition: Secondary | ICD-10-CM | POA: Diagnosis not present

## 2018-09-20 HISTORY — PX: CYSTOSCOPY WITH STENT PLACEMENT: SHX5790

## 2018-09-20 LAB — GLUCOSE, CAPILLARY: Glucose-Capillary: 115 mg/dL — ABNORMAL HIGH (ref 70–99)

## 2018-09-20 SURGERY — CYSTOSCOPY, WITH STENT INSERTION
Anesthesia: General | Site: Ureter | Laterality: Left

## 2018-09-20 MED ORDER — FENTANYL CITRATE (PF) 100 MCG/2ML IJ SOLN: 25.0000 ug | INTRAMUSCULAR | Status: DC | PRN

## 2018-09-20 MED ORDER — ONDANSETRON HCL 4 MG/2ML IJ SOLN
INTRAMUSCULAR | Status: DC | PRN
  Administered 2018-09-20: 4 mg via INTRAVENOUS

## 2018-09-20 MED ORDER — PHENYLEPHRINE 40 MCG/ML (10ML) SYRINGE FOR IV PUSH (FOR BLOOD PRESSURE SUPPORT)
PREFILLED_SYRINGE | INTRAVENOUS | Status: DC | PRN
  Administered 2018-09-20: 120 ug via INTRAVENOUS
  Administered 2018-09-20: 80 ug via INTRAVENOUS
  Administered 2018-09-20 (×2): 120 ug via INTRAVENOUS
  Administered 2018-09-20: 80 ug via INTRAVENOUS

## 2018-09-20 MED ORDER — IOHEXOL 300 MG/ML  SOLN
INTRAMUSCULAR | Status: DC | PRN
  Administered 2018-09-20: 5 mL via URETHRAL

## 2018-09-20 MED ORDER — FENTANYL CITRATE (PF) 100 MCG/2ML IJ SOLN
INTRAMUSCULAR | Status: DC | PRN
  Administered 2018-09-20 (×2): 50 ug via INTRAVENOUS

## 2018-09-20 MED ORDER — SODIUM CHLORIDE 0.9 % IR SOLN
Status: DC | PRN
  Administered 2018-09-20: 3000 mL

## 2018-09-20 MED ORDER — FENTANYL CITRATE (PF) 100 MCG/2ML IJ SOLN
INTRAMUSCULAR | Status: AC
  Filled 2018-09-20: qty 2

## 2018-09-20 MED ORDER — SUCCINYLCHOLINE CHLORIDE 20 MG/ML IJ SOLN
INTRAMUSCULAR | Status: DC | PRN
  Administered 2018-09-20: 120 mg via INTRAVENOUS

## 2018-09-20 MED ORDER — OXYCODONE HCL 5 MG PO TABS: 5.0000 mg | ORAL_TABLET | Freq: Once | ORAL | Status: DC | PRN

## 2018-09-20 MED ORDER — MIDAZOLAM HCL 5 MG/5ML IJ SOLN
INTRAMUSCULAR | Status: DC | PRN
  Administered 2018-09-20 (×2): 1 mg via INTRAVENOUS

## 2018-09-20 MED ORDER — PROPOFOL 10 MG/ML IV BOLUS
INTRAVENOUS | Status: AC
  Filled 2018-09-20: qty 20

## 2018-09-20 MED ORDER — CIPROFLOXACIN IN D5W 400 MG/200ML IV SOLN
400.0000 mg | Freq: Once | INTRAVENOUS | Status: AC
  Administered 2018-09-20: 400 mg via INTRAVENOUS
  Filled 2018-09-20: qty 200

## 2018-09-20 MED ORDER — ACETAMINOPHEN 10 MG/ML IV SOLN: 1000.0000 mg | Freq: Once | INTRAVENOUS | Status: DC | PRN

## 2018-09-20 MED ORDER — EPHEDRINE SULFATE-NACL 50-0.9 MG/10ML-% IV SOSY
PREFILLED_SYRINGE | INTRAVENOUS | Status: DC | PRN
  Administered 2018-09-20: 5 mg via INTRAVENOUS

## 2018-09-20 MED ORDER — PROPOFOL 10 MG/ML IV BOLUS
INTRAVENOUS | Status: DC | PRN
  Administered 2018-09-20: 160 mg via INTRAVENOUS

## 2018-09-20 MED ORDER — LACTATED RINGERS IV SOLN
INTRAVENOUS | Status: DC
  Administered 2018-09-20: 16:00:00 via INTRAVENOUS

## 2018-09-20 MED ORDER — PROMETHAZINE HCL 25 MG/ML IJ SOLN: 6.2500 mg | INTRAMUSCULAR | Status: DC | PRN

## 2018-09-20 MED ORDER — EPHEDRINE 5 MG/ML INJ
INTRAVENOUS | Status: AC
  Filled 2018-09-20: qty 10

## 2018-09-20 MED ORDER — LIDOCAINE 2% (20 MG/ML) 5 ML SYRINGE
INTRAMUSCULAR | Status: DC | PRN
  Administered 2018-09-20: 100 mg via INTRAVENOUS

## 2018-09-20 MED ORDER — PHENYLEPHRINE 40 MCG/ML (10ML) SYRINGE FOR IV PUSH (FOR BLOOD PRESSURE SUPPORT)
PREFILLED_SYRINGE | INTRAVENOUS | Status: AC
  Filled 2018-09-20: qty 20

## 2018-09-20 MED ORDER — CIPROFLOXACIN HCL 500 MG PO TABS: 500.0000 mg | ORAL_TABLET | Freq: Two times a day (BID) | ORAL | 0 refills | Status: AC

## 2018-09-20 MED ORDER — OXYCODONE HCL 5 MG/5ML PO SOLN
5.0000 mg | Freq: Once | ORAL | Status: DC | PRN
  Filled 2018-09-20: qty 5

## 2018-09-20 MED ORDER — MIDAZOLAM HCL 2 MG/2ML IJ SOLN
INTRAMUSCULAR | Status: AC
  Filled 2018-09-20: qty 2

## 2018-09-20 SURGICAL SUPPLY — 16 items
BAG URINE DRAINAGE (UROLOGICAL SUPPLIES) ×2 IMPLANT
BAG URO CATCHER STRL LF (MISCELLANEOUS) ×3 IMPLANT
CATH FOLEY 2WAY SLVR  5CC 22FR (CATHETERS) ×2
CATH FOLEY 2WAY SLVR 5CC 22FR (CATHETERS) IMPLANT
CATH INTERMIT  6FR 70CM (CATHETERS) ×3 IMPLANT
CLOTH BEACON ORANGE TIMEOUT ST (SAFETY) ×3 IMPLANT
COVER WAND RF STERILE (DRAPES) IMPLANT
GLOVE BIOGEL M STRL SZ7.5 (GLOVE) ×3 IMPLANT
GOWN STRL REUS W/TWL LRG LVL3 (GOWN DISPOSABLE) ×6 IMPLANT
GUIDEWIRE STR DUAL SENSOR (WIRE) ×3 IMPLANT
MANIFOLD NEPTUNE II (INSTRUMENTS) ×3 IMPLANT
PACK CYSTO (CUSTOM PROCEDURE TRAY) ×3 IMPLANT
STENT URET 6FRX26 CONTOUR (STENTS) ×2 IMPLANT
SYR 10ML LL (SYRINGE) ×2 IMPLANT
TUBING CONNECTING 10 (TUBING) ×1 IMPLANT
TUBING CONNECTING 10' (TUBING) ×1

## 2018-09-20 NOTE — Interval H&P Note (Signed)
History and Physical Interval Note:  09/20/2018 4:23 PM  Bradley Wong  has presented today for surgery, with the diagnosis of left ureteral calculus  The various methods of treatment have been discussed with the patient and family. After consideration of risks, benefits and other options for treatment, the patient has consented to  Procedure(s): [name] (Left) as a surgical intervention .  The patient's history has been reviewed, patient examined, no change in status, stable for surgery.  I have reviewed the patient's chart and labs.  Questions were answered to the patient's satisfaction.     Shanterria Franta A

## 2018-09-20 NOTE — Anesthesia Postprocedure Evaluation (Signed)
Anesthesia Post Note  Patient: Bradley Wong  Procedure(s) Performed: CYSTOSCOPY WITH STENT PLACEMENT (Left Ureter)     Patient location during evaluation: PACU Anesthesia Type: General Level of consciousness: awake and alert Pain management: pain level controlled Vital Signs Assessment: post-procedure vital signs reviewed and stable Respiratory status: spontaneous breathing, nonlabored ventilation and respiratory function stable Cardiovascular status: blood pressure returned to baseline and stable Postop Assessment: no apparent nausea or vomiting Anesthetic complications: no    Last Vitals:  Vitals:   09/20/18 1918 09/20/18 1930  BP: 133/83 (!) 141/77  Pulse: 86 89  Resp: 16 15  Temp: 36.6 C   SpO2: 100% 100%    Last Pain:  Vitals:   09/20/18 1930  TempSrc:   PainSc: 0-No pain                 Brennan Bailey

## 2018-09-20 NOTE — Anesthesia Preprocedure Evaluation (Signed)
Anesthesia Evaluation  Patient identified by MRN, date of birth, ID band Patient awake    Reviewed: Allergy & Precautions, NPO status , Patient's Chart, lab work & pertinent test results  Airway Mallampati: II  TM Distance: >3 FB Neck ROM: Full    Dental  (+) Teeth Intact, Dental Advisory Given   Pulmonary    breath sounds clear to auscultation       Cardiovascular hypertension,  Rhythm:Regular Rate:Normal     Neuro/Psych    GI/Hepatic   Endo/Other  diabetes  Renal/GU      Musculoskeletal   Abdominal   Peds  Hematology   Anesthesia Other Findings   Reproductive/Obstetrics                             Anesthesia Physical Anesthesia Plan  ASA: III  Anesthesia Plan: General   Post-op Pain Management:    Induction: Intravenous and Rapid sequence  PONV Risk Score and Plan: Ondansetron and Scopolamine patch - Pre-op  Airway Management Planned: Oral ETT  Additional Equipment:   Intra-op Plan:   Post-operative Plan: Extubation in OR  Informed Consent: I have reviewed the patients History and Physical, chart, labs and discussed the procedure including the risks, benefits and alternatives for the proposed anesthesia with the patient or authorized representative who has indicated his/her understanding and acceptance.   Dental advisory given  Plan Discussed with: CRNA and Anesthesiologist  Anesthesia Plan Comments:         Anesthesia Quick Evaluation

## 2018-09-20 NOTE — Discharge Instructions (Signed)
1. You may see some blood in the urine and may have some burning with urination for 48-72 hours. You also may notice that you have to urinate more frequently or urgently after your procedure which is normal.  2. You should call should you develop an inability urinate, fever > 101, persistent nausea and vomiting that prevents you from eating or drinking to stay hydrated.  3. If you have a stent, you will likely urinate more frequently and urgently until the stent is removed and you may experience some discomfort/pain in the lower abdomen and flank especially when urinating. You may take pain medication prescribed to you if needed for pain. You may also intermittently have blood in the urine until the stent is removed. 4. If you have a catheter, you will be taught how to take care of the catheter by the nursing staff prior to discharge from the hospital.  You may periodically feel a strong urge to void with the catheter in place.  This is a bladder spasm and most often can occur when having a bowel movement or moving around. It is typically self-limited and usually will stop after a few minutes.  You may use some Vaseline or Neosporin around the tip of the catheter to reduce friction at the tip of the penis. You may also see some blood in the urine.  A very small amount of blood can make the urine look quite red.  As long as the catheter is draining well, there usually is not a problem.  However, if the catheter is not draining well and is bloody, you should call the office [phone]) to notify us. 5. Take the prescribed antibiotic. If you develop fevers > 101, call us at the number above.  6. If unable to void with significant suprapubic discomfort, proceed to an emergency department or call us at the number above. 7. We will re-schedule your stone treatment surgery.

## 2018-09-20 NOTE — H&P (Signed)
Patient was assessed on November 19th in the office and had a 6 x 4 mm stone in the upper left ureter with moderate left hydronephrosis. It appears he was scheduled for lithotripsy and he was placed on pain medicine Zofran and Flomax. There was no bacteria in his urine. He responded well to Toradol 30 mg IM injection.   It appears that he is scheduled for lithotripsy on Monday with Dr. Alinda Money   The patient has been taking Percocet and I believe Reglan and the pain comes and goes but has remained quite significant and severe. He has had his temperature ranges being 100 and 100.4. He has had some chills. He has never had a stone or bladder or kidney surgery.   I reviewed the CT scan and KUB. The stone might be at L4 spinous process but there was a lot of gas and could not definitively see it.   The patient looks very uncomfortable. He had no epididymitis. His abdomen was a bit bloated. He had no significant left CVA tenderness   There is no other aggravating or relieving factors  There is no other associated signs and symptoms  The severity of the symptoms is moderate  The symptoms are ongoing and bothersome   Patient's urine looked normal but is sent for culture   I thought because of the patient's severity of pain and reported low-grade temperature I discussed the stents with him. He was afebrile and did not look toxic but looked quite uncomfortable. He understands that the stone was difficult to see on KUB but he is scheduled for lithotripsy. Again I think I can see the stone perhaps at L4. the KUB with bloating and distended bowel fit the clinical picture   patient consented to the stent. I am not convinced he is infected and he likely could still have lithotripsy on Monday pending intraoperative results and pending culture   to get him comfortable I gave him Toradol 30 mg IM x1 dose. We also gave him Phenergan 25 mg IM x1 dose     ALLERGIES: Penicillins    MEDICATIONS: Tamsulosin Hcl  0.4 mg capsule 1 capsule PO Daily  Aspirin 81 MG TABS Oral  Benicar 40 MG Oral Tablet Oral  Crestor 40 MG Oral Tablet Oral  Reglan 5 mg tablet 1 tablet PO BID PRN prn nausea  Synthroid 112 MCG Oral Tablet Oral  Vitamin D-3 5000 UNIT TABS Oral     Notes: He is on a med for DM and thinks it is metformin and his sugar is down.    GU PSH: No GU PSH      PSH Notes: Nerve Block Transforaminal Epidural, Thyroid Surgery Total Thyroidectomy, Knee Surgery Right   NON-GU PSH: Remove Thyroid - 2014    GU PMH: Renal colic - 16/07/9603 Ureteral calculus - 09/17/2018 ED due to arterial insufficiency, He has a new complaint of ED. He has DM and HTN and the cause is likely vascular. I discussed the benefits of weight loss and exercise. He will try going off of tamsulosin but I don't know that that is likely to help. If it doesn't he will let me know and we can try a PDE5. - 09/13/2018 Elevated PSA, His PSA is down. I will have him return in a year with a PSA. - 09/13/2018, His PSA is up some but below his prior high. I will recheck in 6 months and have him f/u in a year with a PSA. , - 09/14/2017 (Stable), -  2017, Elevated prostate specific antigen (PSA), - 2016 Nocturia (Stable) - 2017 Prostate, Neoplasm of uncertain behavior, Neoplasm of uncertain behavior of prostate - 2016 BPH w/LUTS, Benign prostatic hyperplasia with urinary obstruction - 2015 Renal cyst, Renal cyst, acquired, left - 2015 Dysuria, Dysuria - 2014 Urinary Frequency, Urinary frequency - 2014      PMH Notes:  2013-01-17 08:32:44 - Note: Thyroid Cancer  2013-01-17 08:32:44 - Note: Arthritis   NON-GU PMH: Encounter for general adult medical examination without abnormal findings, Encounter for preventive health examination - 2017 Prediabetes, Borderline diabetes - 2016 Other intervertebral disc degeneration, lumbar region, Lumbar Disc Degeneration - 2014 Personal history of other diseases of the circulatory system, History of  hypertension - 2014 Personal history of other diseases of the digestive system, History of esophageal reflux - 2014 Personal history of other endocrine, nutritional and metabolic disease, History of hypercholesterolemia - 2014    FAMILY HISTORY: Cancer - Runs In Family Congestive Heart Failure - Runs In Family Death In The Family Father - Runs In Family Death In The Family Mother - Runs In Family Family Health Status Number - Runs In Family renal failure - Runs In Family   SOCIAL HISTORY: Marital Status: Married Current Smoking Status: Patient has never smoked.   Tobacco Use Assessment Completed: Used Tobacco in last 30 days? Has never drank.      Notes: Occupation: Retired, Alcohol Use, Never A Smoker, Caffeine Use, Marital History - Currently Married   REVIEW OF SYSTEMS:    GU Review Male:   Patient denies frequent urination, hard to postpone urination, burning/ pain with urination, get up at night to urinate, leakage of urine, stream starts and stops, trouble starting your stream, have to strain to urinate , erection problems, and penile pain.  Gastrointestinal (Upper):   Patient denies nausea, vomiting, and indigestion/ heartburn.  Gastrointestinal (Lower):   Patient denies diarrhea and constipation.  Constitutional:   Patient denies fever, night sweats, weight loss, and fatigue.  Skin:   Patient denies skin rash/ lesion and itching.  Eyes:   Patient denies blurred vision and double vision.  Ears/ Nose/ Throat:   Patient denies sore throat and sinus problems.  Hematologic/Lymphatic:   Patient denies swollen glands and easy bruising.  Cardiovascular:   Patient denies leg swelling and chest pains.  Respiratory:   Patient denies cough and shortness of breath.  Endocrine:   Patient denies excessive thirst.  Musculoskeletal:   Patient denies back pain and joint pain.  Neurological:   Patient denies headaches and dizziness.  Psychologic:   Patient denies depression and anxiety.    VITAL SIGNS:      09/20/2018 01:55 PM  BP 147/89 mmHg  Pulse 79 /min  Temperature 98.0 F / 36.6 C   PAST DATA REVIEWED:  Source Of History:  Patient   08/31/18 09/09/17 04/29/17 08/15/16 01/17/13  PSA  Total PSA 3.6 ng/dl 4.9 ng/dl 4.4 ng/dl 3.9 ng/dl 6.70   % Free PSA    17.9 %     PROCEDURES:          Urinalysis w/Scope - 81001 Dipstick Dipstick Cont'd Micro  Color: Amber Bilirubin: Neg WBC/hpf: 0 - 5/hpf  Appearance: Clear Ketones: Trace RBC/hpf: NS (Not Seen)  Specific Gravity: 1.025 Blood: Neg Bacteria: NS (Not Seen)  pH: <=5.0 Protein: 1+ Cystals: NS (Not Seen)  Glucose: Neg Urobilinogen: 0.2 Casts: NS (Not Seen)    Nitrites: Neg Trichomonas: Not Present    Leukocyte Esterase: Neg Mucous: Present  Epithelial Cells: 0 - 5/hpf      Yeast: NS (Not Seen)      Sperm: Not Present    Notes:            Ketoralac 30mg  - [id number], [id number] Qty: 30 Adm. By: Parks Ranger  Unit: mg Lot No [id number]  Route: IM Exp. Date 03/19/2018  Freq: None Mfgr.:   Site: Right Buttock         Phenergan 25mg  - [id number], [id number] Qty: 25 Adm. By: Parks Ranger  Unit: mg Lot No [id number]  Route: IM Exp. Date   Freq: None Mfgr.: 10/20  Site: Left Buttock   ASSESSMENT:      ICD-10 Details  1 GU:   Ureteral calculus - N20.1      PLAN:           Orders Labs Urine Culture          Document Letter(s):  Created for Patient: Clinical Summary         Next Appointment:     After a thorough review of the management options for the patient's condition the patient  elected to proceed with surgical therapy as noted above. We have discussed the potential benefits and risks of the procedure, side effects of the proposed treatment, the likelihood of the patient achieving the goals of the procedure, and any potential problems that might occur during the procedure or recuperation. Informed consent has been obtained.

## 2018-09-20 NOTE — H&P (Signed)
Office Visit Report     09/17/2018   --------------------------------------------------------------------------------   Bradley Wong  MRN: [id number]  PRIMARY CARE:  Chipper Herb, MD  DOB: 12/21/1945, 72 year old Male  REFERRING:  Chipper Herb, MD  SSN: -[id number]  PROVIDER:  Irine Seal, M.D.    TREATING:  Beckey Rutter.    LOCATION:  [facility] [phone]   --------------------------------------------------------------------------------   CC: I have ureteral stone.  HPI: Bradley Wong is a 72 year-old male established patient who is here for ureteral stone.  No prior h/o stone.   The problem is on the left side. He first stated noticing pain on approximately 09/16/2018. He is currently having flank pain and nausea. He denies having back pain, groin pain, vomiting, fever, and chills. Pain is occuring on the left side.     CC: I have pain in the kidney.  HPI: This patient c/o left sided renal colic since yesterday. He went to the ER yesterday, and CT Scan showed a 6x4 mm stone in the upper left ureter and moderate left hydronephrosis. He was discharged with a script for Percocet, Zofran, and Flomax. He took Percocet this morning at 8:00 a.m. and now at 2:00 p.m. he is having recurrence of left flank pain. He denies dysuria, gross hematuria, or difficulty emptying his bladder. Has been nauseous but he denies recent vomiting. He denies fever. She does report constipation and no BM in 2 days. He is requesting surgical intervention.   The problem is on the left side. His pain started about approximately 09/16/2018. The pain is sharp. The pain is constant. The pain does radiate.     ALLERGIES: Penicillins    MEDICATIONS: Tamsulosin Hcl 0.4 mg capsule 1 capsule PO Daily  Aspirin 81 MG TABS Oral  Benicar 40 MG Oral Tablet Oral  Crestor 40 MG Oral Tablet Oral  Synthroid 112 MCG Oral Tablet Oral  Vitamin D-3 5000 UNIT TABS Oral     Notes: He is on a med  for DM and thinks it is metformin and his sugar is down.    GU PSH: None     PSH Notes: Nerve Block Transforaminal Epidural, Thyroid Surgery Total Thyroidectomy, Knee Surgery Right   NON-GU PSH: Remove Thyroid - 2014    GU PMH: ED due to arterial insufficiency, He has a new complaint of ED. He has DM and HTN and the cause is likely vascular. I discussed the benefits of weight loss and exercise. He will try going off of tamsulosin but I don't know that that is likely to help. If it doesn't he will let me know and we can try a PDE5. - 09/13/2018 Elevated PSA, His PSA is down. I will have him return in a year with a PSA. - 09/13/2018, His PSA is up some but below his prior high. I will recheck in 6 months and have him f/u in a year with a PSA. , - 09/14/2017 (Stable), - 2017, Elevated prostate specific antigen (PSA), - 2016 Nocturia (Stable) - 2017 Prostate, Neoplasm of uncertain behavior, Neoplasm of uncertain behavior of prostate - 2016 BPH w/LUTS, Benign prostatic hyperplasia with urinary obstruction - 2015 Renal cyst, Renal cyst, acquired, left - 2015 Dysuria, Dysuria - 2014 Urinary Frequency, Urinary frequency - 2014      PMH Notes:  2013-01-17 08:32:44 - Note: Thyroid Cancer  2013-01-17 08:32:44 - Note: Arthritis   NON-GU PMH: Encounter for general adult medical examination without abnormal findings, Encounter for  preventive health examination - 2017 Prediabetes, Borderline diabetes - 2016 Other intervertebral disc degeneration, lumbar region, Lumbar Disc Degeneration - 2014 Personal history of other diseases of the circulatory system, History of hypertension - 2014 Personal history of other diseases of the digestive system, History of esophageal reflux - 2014 Personal history of other endocrine, nutritional and metabolic disease, History of hypercholesterolemia - 2014    FAMILY HISTORY: Cancer - Runs In Family Congestive Heart Failure - Runs In Family Death In The Family Father -  Runs In Family Death In The Family Mother - Runs In Family Family Health Status Number - Runs In Family renal failure - Runs In Family   SOCIAL HISTORY: Marital Status: Married Current Smoking Status: Patient has never smoked.   Tobacco Use Assessment Completed: Used Tobacco in last 30 days? Has never drank.      Notes: Occupation: Retired, Alcohol Use, Never A Smoker, Caffeine Use, Marital History - Currently Married   REVIEW OF SYSTEMS:    GU Review Male:   Patient denies frequent urination, hard to postpone urination, burning/ pain with urination, get up at night to urinate, leakage of urine, stream starts and stops, trouble starting your stream, have to strain to urinate , erection problems, and penile pain.  Gastrointestinal (Upper):   Patient denies nausea, vomiting, and indigestion/ heartburn.  Gastrointestinal (Lower):   Patient denies diarrhea and constipation.  Constitutional:   Patient reports night sweats. Patient denies fever, weight loss, and fatigue.  Skin:   Patient reports itching. Patient denies skin rash/ lesion.  Eyes:   Patient denies blurred vision and double vision.  Ears/ Nose/ Throat:   Patient denies sore throat and sinus problems.  Hematologic/Lymphatic:   Patient denies swollen glands and easy bruising.  Cardiovascular:   Patient denies leg swelling and chest pains.  Respiratory:   Patient reports shortness of breath. Patient denies cough.  Endocrine:   Patient denies excessive thirst.  Musculoskeletal:   Patient reports joint pain. Patient denies back pain.  Neurological:   Patient reports dizziness. Patient denies headaches.  Psychologic:   Patient denies depression and anxiety.   VITAL SIGNS:      09/17/2018 01:54 PM  BP 138/84 mmHg  Pulse 75 /min  Temperature 98.5 F / 36.9 C   MULTI-SYSTEM PHYSICAL EXAMINATION:    Constitutional: Well-nourished. No physical deformities. Normally developed. Good grooming.  Neck: Neck symmetrical, not swollen. Normal  tracheal position.  Respiratory: No labored breathing, no use of accessory muscles.   Neurologic / Psychiatric: Patient anxious. Oriented to time, oriented to place, oriented to person. No depression, no agitation.   Gastrointestinal: No mass, no tenderness, no rigidity, non obese abdomen. + Lower Left Abdominal Discomfort.  Musculoskeletal: Normal gait and station of head and neck.     PAST DATA REVIEWED:  Source Of History:  Patient  X-Ray Review: C.T. Abdomen: Reviewed Report.  C.T. Abdomen/Pelvis: Reviewed Films. Reviewed Report. Discussed With Patient. Moderate left hydronephrosis due to a 6 by 4 mm mm stone in the upper left ureter. Bilateral nephrolithiasis with solitary stone on the right measuring 6 mm and 2 left renal calculi in the lower pole measuring up to 4 mm.    08/31/18 09/09/17 04/29/17 08/15/16 01/17/13  PSA  Total PSA 3.6 ng/dl 4.9 ng/dl 4.4 ng/dl 3.9 ng/dl 6.70   % Free PSA    17.9 %     PROCEDURES:         KUB - 74018  A single view of the abdomen  is obtained. Bowel gas present. Difficult to discern the presence of stones.              PVR Ultrasound - [id number]  Scanned Volume: 0 cc         Urinalysis w/Scope Dipstick Dipstick Cont'd Micro  Color: Yellow Bilirubin: Neg mg/dL WBC/hpf: 0 - 5/hpf  Appearance: Clear Ketones: Neg mg/dL RBC/hpf: 3 - 10/hpf  Specific Gravity: 1.025 Blood: 2+ ery/uL Bacteria: NS (Not Seen)  pH: <=5.0 Protein: Trace mg/dL Cystals: NS (Not Seen)  Glucose: Neg mg/dL Urobilinogen: 0.2 mg/dL Casts: NS (Not Seen)    Nitrites: Neg Trichomonas: Not Present    Leukocyte Esterase: Neg leu/uL Mucous: Not Present      Epithelial Cells: 0 - 5/hpf      Yeast: NS (Not Seen)      Sperm: Not Present         Ketoralac 30mg  - [id number], [id number] The site was cleaned with alcohol and a band aid was applied. Patient tolerated well.   Qty: 30 Adm. By: Alferd Patee  Unit: mg Lot No [id number]  Route: IM Exp. Date 03/29/2020  Freq: None Mfgr.:    Site: Left Hip   ASSESSMENT:      ICD-10 Details  1 GU:   Ureteral calculus - L46.5   2   Renal colic - K35    PLAN:            Medications New Meds: Reglan 5 mg tablet 1 tablet PO BID PRN prn nausea  #10  0 Refill(s)            Orders Labs Urine Culture  X-Rays: KUB          Schedule Return Visit/Planned Activity: 1-2 Weeks - Office Visit, Extender             Note: F/U Megan          Document Letter(s):  Created for Patient: Clinical Summary         Notes:   This patient began to experience renal colic yesterday. According to the CT scan completed yesterday he has a 6 x 4 mm stone in the upper left ureter and moderate left hydronephrosis. He was given Toradol 30 mg IM injection our office today and started feeling much better. He is requesting surgical intervention. We discussed ureteroscopy versus ESWL and he prefers ESWL. Will plan for ESWL and in the interim, I recommended that he continue Flomax and Percocet p.r.n.. He previously told me that Zofran made him even more nauseous. Therefore I prescribed Reglan. The patient and his wife were also given strict parameters for notifying the office if he experiences intolerable pain fever, chills, nausea, or vomiting. Will also make a followup for the next 1-2 weeks in case he decides not to undergo ESWL.   CC: Dr. Chipper Herb        Next Appointment:      Next Appointment: 09/30/2018 11:00 AM    Appointment Type: 30 Minute Office Visit Established Pt    Location: [facility] [phone]    Provider: Salley Slaughter, P.A.    Reason for Visit: 2wk f/u-parks      * Signed by Salley Slaughter, P.A. on 09/17/18 at 5:32 PM (EST)*

## 2018-09-20 NOTE — Transfer of Care (Signed)
Immediate Anesthesia Transfer of Care Note  Patient: Bradley Wong  Procedure(s) Performed: CYSTOSCOPY WITH STENT PLACEMENT (Left Ureter)  Patient Location: PACU  Anesthesia Type:General  Level of Consciousness: drowsy and patient cooperative  Airway & Oxygen Therapy: Patient Spontanous Breathing and Patient connected to face mask oxygen  Post-op Assessment: Report given to RN and Post -op Vital signs reviewed and stable  Post vital signs: Reviewed and stable  Last Vitals:  Vitals Value Taken Time  BP 133/83 09/20/2018  7:20 PM  Temp    Pulse 86 09/20/2018  7:22 PM  Resp 14 09/20/2018  7:22 PM  SpO2 100 % 09/20/2018  7:22 PM  Vitals shown include unvalidated device data.  Last Pain:  Vitals:   09/20/18 1617  TempSrc:   PainSc: 0-No pain      Patients Stated Pain Goal: 4 (36/[id number])  Complications: No apparent anesthesia complications

## 2018-09-20 NOTE — Op Note (Signed)
PATIENT:  Bradley Wong  Preoperative diagnosis:  1. Left obstructing ureteral stone  Postoperative diagnosis:  1. Left obstructing ureteral stone   Procedure:  1. Cystoscopy 2. Left ureteral stent placement (6 x 26Fr), without dangler 3. Left retrograde pyelography with interpretation  Surgeon: Bjorn Loser, M.D.  Resident: Coralie Common, MD, PhD  Anesthesia: General  Complications: None  EBL: Minimal  Specimens: None  Indication: Bradley Wong is a 72 y.o. male with a left obstructing ureteral calculus and moderate left hydronephrosis. He experienced uncontrolled pain and temperature at home of 100.38F. After reviewing the management options for treatment, they have elected to proceed with the above surgical procedure(s). We have discussed the potential benefits and risks of the procedure, side effects of the proposed treatment, the likelihood of the patient achieving the goals of the procedure, and any potential problems that might occur during the procedure or recuperation. Informed consent has been obtained.  Findings:  - Prostatic enlargement with kissing lateral lobes and a significant median lobe. Prostatic mucosa was noted to be very friable. - Following access to the proximal ureter, thick, brown drainage was noted to drain around the wire, possibly consistent with old blood.  - Successful placement of left ureteral stent with proximal curl in the renal pelvis and distal curl in the bladder.  - Given size of prostate with friable mucosa, any ureteroscopy is anticipated to be difficult with potential for prostatic bleeding. - Prostatic oozing noted at the completion of the case and foley catheter was placed to tamponade bleeding with plan to remove in PACU one hour post-operatively.   Radiographic interpretation: - Scout image demonstrated radio-opacity in the distal ureter approximately 5-6 cm proximal to the ureteral orifice, consistent with a stone  that migrated distally. - Retrograde pyelogram demonstrated hydroureter from the level of the distal ureter with hydronephrosis. No contrast extravasation. - Excellent position of the left ureteral stent with proximal curl in the renal pelvis and distal curl in the bladder.   Description of procedure:   The patient was taken to the operating room and general anesthesia was induced.  The patient was placed in the dorsal lithotomy position, prepped and draped in the usual sterile fashion, and preoperative antibiotics were administered. A preoperative time-out was performed.   Cystourethroscopy was performed.  The patient's urethra was examined and demonstrated findings as above. The bladder was then systematically examined in its entirety. There was no evidence for any bladder tumors, stones, or other mucosal pathology.   Attention then turned to the left ureteral orifice and a ureteral catheter with assistance of a wire was used to intubate the ureteral orifice. Wire placed into the renal pelvis under fluoroscopic guidance and, following wire removal, omnipaque contrast was injected through the ureteral catheter and a retrograde pyelogram which revealed the above findings.  A Sensor guidewire was then advanced up the left ureter into the renal pelvis under fluoroscopic guidance. Ureteral catheter was removed and a ureteral stent was advance over the wire using Seldinger technique.  The stent was positioned appropriately under fluoroscopic and cystoscopic guidance.  The wire was then removed with an adequate stent curl noted in the renal pelvis as well as in the bladder.  The prostate was noted to have diffuse oozing with friable mucosa. Bladder was irrigated through the scope and oozing significantly improved. To continue to tamponade the prostate, a 22 Fr foley catheter was placed with 10 cc sterile water in the balloon.   The procedure ended.  The patient appeared to tolerate the procedure well and  without complications.  The patient was able to be awakened and transferred to the recovery unit in satisfactory condition.   Plan: - Remove foley catheter 1 hour post-operatively and perform trial of void in PACU.  - Discharge home following trial of void and when meeting PACU discharge criteria. - Ciprofloxacin for 7 days empirically. Will follow-up urine culture results. - Plan to postpone Monday's ESWL OR given drainage from ureteral orifice and elevated temperature pre-operatively.

## 2018-09-20 NOTE — Anesthesia Procedure Notes (Signed)
Procedure Name: Intubation Date/Time: 09/20/2018 6:35 PM Performed by: Montel Clock, CRNA Pre-anesthesia Checklist: Patient identified, Emergency Drugs available, Suction available, Patient being monitored and Timeout performed Patient Re-evaluated:Patient Re-evaluated prior to induction Oxygen Delivery Method: Circle system utilized Preoxygenation: Pre-oxygenation with 100% oxygen Induction Type: IV induction, Rapid sequence and Cricoid Pressure applied Laryngoscope Size: Mac and 3 Grade View: Grade II Tube type: Oral Tube size: 7.5 mm Number of attempts: 1 Airway Equipment and Method: Stylet Placement Confirmation: ETT inserted through vocal cords under direct vision,  positive ETCO2 and breath sounds checked- equal and bilateral Secured at: 23 cm Tube secured with: Tape Dental Injury: Teeth and Oropharynx as per pre-operative assessment

## 2018-09-20 NOTE — Progress Notes (Signed)
200 cc foley flush a few clots noted  Urine pink and draining well after flush  Foley d/c small amount of pinkish discharge from penis noted md aware

## 2018-09-21 ENCOUNTER — Encounter (HOSPITAL_COMMUNITY): Payer: Self-pay | Admitting: Urology

## 2018-09-23 ENCOUNTER — Other Ambulatory Visit: Payer: Self-pay | Admitting: Urology

## 2018-09-23 NOTE — Progress Notes (Signed)
Spoke with patient about time and date change for lithotripsy. Patient is to arrive 0745 on 09/30/18 to admitting. NPO after midnight for solids. Clear liquids until 0530 09/30/18 in case Dr Lovena Neighbours runs ahead of schedule. Hold aspirin and NSAIDS for 72 hours before surgery. Take levothyroxine the morning of procedure with small sip of water. Needs responsible driver to take him home. Bring blue folder. He verbalizes understanding.

## 2018-09-30 ENCOUNTER — Encounter (HOSPITAL_COMMUNITY): Payer: Self-pay | Admitting: *Deleted

## 2018-09-30 ENCOUNTER — Ambulatory Visit (HOSPITAL_COMMUNITY)
Admission: RE | Admit: 2018-09-30 | Discharge: 2018-09-30 | Disposition: A | Discharge status: Home / Self Care | Payer: Medicare Other | Source: Ambulatory Visit | Attending: Urology | Admitting: Urology

## 2018-09-30 ENCOUNTER — Ambulatory Visit (HOSPITAL_COMMUNITY): Payer: Medicare Other

## 2018-09-30 ENCOUNTER — Other Ambulatory Visit: Payer: Self-pay

## 2018-09-30 ENCOUNTER — Encounter (HOSPITAL_COMMUNITY)
Admission: RE | Disposition: A | Discharge status: Home / Self Care | Payer: Self-pay | Source: Ambulatory Visit | Attending: Urology

## 2018-09-30 DIAGNOSIS — Z79899 Other long term (current) drug therapy: Secondary | ICD-10-CM | POA: Insufficient documentation

## 2018-09-30 DIAGNOSIS — N202 Calculus of kidney with calculus of ureter: Secondary | ICD-10-CM | POA: Diagnosis not present

## 2018-09-30 DIAGNOSIS — Z85850 Personal history of malignant neoplasm of thyroid: Secondary | ICD-10-CM | POA: Diagnosis not present

## 2018-09-30 DIAGNOSIS — E119 Type 2 diabetes mellitus without complications: Secondary | ICD-10-CM | POA: Diagnosis not present

## 2018-09-30 DIAGNOSIS — Z880 Allergy status to penicillin: Secondary | ICD-10-CM | POA: Insufficient documentation

## 2018-09-30 DIAGNOSIS — E890 Postprocedural hypothyroidism: Secondary | ICD-10-CM | POA: Diagnosis not present

## 2018-09-30 DIAGNOSIS — N132 Hydronephrosis with renal and ureteral calculous obstruction: Secondary | ICD-10-CM | POA: Diagnosis not present

## 2018-09-30 DIAGNOSIS — Z7982 Long term (current) use of aspirin: Secondary | ICD-10-CM | POA: Insufficient documentation

## 2018-09-30 DIAGNOSIS — Z8249 Family history of ischemic heart disease and other diseases of the circulatory system: Secondary | ICD-10-CM | POA: Diagnosis not present

## 2018-09-30 DIAGNOSIS — N201 Calculus of ureter: Secondary | ICD-10-CM

## 2018-09-30 DIAGNOSIS — Z79890 Hormone replacement therapy (postmenopausal): Secondary | ICD-10-CM | POA: Insufficient documentation

## 2018-09-30 DIAGNOSIS — E785 Hyperlipidemia, unspecified: Secondary | ICD-10-CM | POA: Diagnosis not present

## 2018-09-30 DIAGNOSIS — I10 Essential (primary) hypertension: Secondary | ICD-10-CM | POA: Diagnosis not present

## 2018-09-30 LAB — GLUCOSE, CAPILLARY: Glucose-Capillary: 119 mg/dL — ABNORMAL HIGH (ref 70–99)

## 2018-09-30 SURGERY — LITHOTRIPSY, ESWL
Anesthesia: LOCAL | Laterality: Left

## 2018-09-30 MED ORDER — DIPHENHYDRAMINE HCL 25 MG PO CAPS
25.0000 mg | ORAL_CAPSULE | ORAL | Status: AC
  Administered 2018-09-30: 25 mg via ORAL
  Filled 2018-09-30: qty 1

## 2018-09-30 MED ORDER — CIPROFLOXACIN HCL 500 MG PO TABS
500.0000 mg | ORAL_TABLET | ORAL | Status: AC
  Administered 2018-09-30: 500 mg via ORAL
  Filled 2018-09-30: qty 1

## 2018-09-30 MED ORDER — DIAZEPAM 5 MG PO TABS
10.0000 mg | ORAL_TABLET | ORAL | Status: AC
  Administered 2018-09-30: 10 mg via ORAL
  Filled 2018-09-30: qty 2

## 2018-09-30 MED ORDER — SODIUM CHLORIDE 0.9 % IV SOLN
INTRAVENOUS | Status: DC
  Administered 2018-09-30: 08:00:00 via INTRAVENOUS

## 2018-09-30 NOTE — Op Note (Signed)
[facility] Operative Note  Treating Physician: Ellison Hughs, MD  Pre-op diagnosis: Left ureteral stone  Post-op diagnosis: Same   Procedure: LEFT ESWL  See Ar[facility] Everts OP note scanned into chart. Also because of the size, density, location and other factors that cannot be anticipated I feel this will likely be a staged procedure. This fact supersedes any indication in the scanned [place] stone operative note to the contrary  Plan:  The patient will f/u in 7-10 days for a KUB and possible stent removal.

## 2018-09-30 NOTE — H&P (Signed)
Urology Preoperative H&P   Chief Complaint: Left flank pain  History of Present Illness: Bradley Wong is a 72 y.o. male with a 2 week history of intermittent left sided flank pain due to an obstructing 6 mm left ureteral stone.  He denies N/V/F/C, dysuria or hematuria.  His pain radiates into the LLQ and is partially alleviated with PO pain medications.  He states that he is voiding at baseline.    Past Medical History:  Diagnosis Date  . Diabetes mellitus without complication ([place])   . Dyslipidemia   . History of colon polyps   . History of seasonal allergies   . History of thyroid cancer 2000   status post thyroidectomy  . HTN (hypertension)   . Hyperlipidemia   . Thyroid disease     Past Surgical History:  Procedure Laterality Date  . CYSTOSCOPY WITH STENT PLACEMENT Left 09/20/2018   Procedure: CYSTOSCOPY WITH STENT PLACEMENT;  Surgeon: Bjorn Loser, MD;  Location: [facility];  Service: Urology;  Laterality: Left;  . KNEE ARTHROSCOPY AND ARTHROTOMY Right 1979  . PROSTATE BIOPSY  01/2013  . THYROIDECTOMY Bilateral 2000   THYROID CA  . TONSILLECTOMY Bilateral childhood    Allergies:  Allergies  Allergen Reactions  . Niaspan [Niacin Er] Other (See Comments)    flushing  . Penicillins Rash    Has patient had a PCN reaction causing immediate rash, facial/tongue/throat swelling, SOB or lightheadedness with hypotension: No Has patient had a PCN reaction causing severe rash involving mucus membranes or skin necrosis: No Has patient had a PCN reaction that required hospitalization: Unknown Has patient had a PCN reaction occurring within the last 10 years: No If all of the above answers are "NO", then may proceed with Cephalosporin use.     Family History  Problem Relation Age of Onset  . Coronary artery disease Mother 105  . Hypertension Mother   . Congestive Heart Failure Mother   . Cancer Father 53       throat and bone cancer  . Diabetes Brother        deceased  .  Heart disease Brother   . Kidney failure Brother   . Diabetes Maternal Grandmother   . Cancer Paternal Grandfather        leukemia  . Diabetes Brother   . Heart disease Brother   . Kidney failure Brother   . Colon cancer Neg Hx   . Rectal cancer Neg Hx   . Stomach cancer Neg Hx     Social History:  reports that he has never smoked. He has never used smokeless tobacco. He reports that he drinks alcohol. He reports that he does not use drugs.  ROS: A complete review of systems was performed.  All systems are negative except for pertinent findings as noted.  Physical Exam:  Vital signs in last 24 hours:   Constitutional:  Alert and oriented, No acute distress Cardiovascular: Regular rate and rhythm, No JVD Respiratory: Normal respiratory effort, Lungs clear bilaterally GI: Abdomen is soft, nontender, nondistended, no abdominal masses GU: No CVA tenderness Lymphatic: No lymphadenopathy Neurologic: Grossly intact, no focal deficits Psychiatric: Normal mood and affect  Laboratory Data:  No results for input(s): WBC, HGB, HCT, PLT in the last 72 hours.  No results for input(s): NA, K, CL, GLUCOSE, BUN, CALCIUM, CREATININE in the last 72 hours.  Invalid input(s): CO3   No results found for this or any previous visit (from the past 24 hour(s)). No results found for  this or any previous visit (from the past 240 hour(s)).  Renal Function: No results for input(s): CREATININE in the last 168 hours. Estimated Creatinine Clearance: 57.5 mL/min (A) (by C-G formula based on SCr of 1.41 mg/dL (H)).  Radiologic Imaging: No results found.  I independently reviewed the above imaging studies.  Assessment and Plan Bradley Wong is a 72 y.o. male with a 6 mm left mid ureteral stone  The risks, benefits and alternatives of LEFT ESWL was discussed with the patient. I described the risks which include arrhythmia, kidney contusion, kidney hemorrhage, need for transfusion, back discomfort,  flank ecchymosis, flank abrasion, inability to fracture the stone, inability to pass stone fragments, Steinstrasse, infection associated with obstructing stones, need for an alternative surgical procedure and possible need for repeat shockwave lithotripsy.  The patient voices understanding and wishes to proceed.    Ellison Hughs, MD 09/30/2018, 6:44 AM  [facility] Pager: [phone]

## 2018-10-07 DIAGNOSIS — N202 Calculus of kidney with calculus of ureter: Secondary | ICD-10-CM | POA: Diagnosis not present

## 2018-11-08 ENCOUNTER — Ambulatory Visit (INDEPENDENT_AMBULATORY_CARE_PROVIDER_SITE_OTHER): Payer: Medicare Other | Admitting: Physician Assistant

## 2018-11-08 ENCOUNTER — Encounter: Payer: Self-pay | Admitting: Physician Assistant

## 2018-11-08 VITALS — BP 128/79 | HR 77 | Temp 96.8°F | Ht 74.0 in | Wt 211.8 lb

## 2018-11-08 DIAGNOSIS — B349 Viral infection, unspecified: Secondary | ICD-10-CM

## 2018-11-08 DIAGNOSIS — Z23 Encounter for immunization: Secondary | ICD-10-CM

## 2018-11-08 NOTE — Progress Notes (Signed)
BP 128/79   Pulse 77   Temp (!) 96.8 F (36 C) (Oral)   Ht 6\' 2"  (1.88 m)   Wt 211 lb 12.8 oz (96.1 kg)   BMI 27.19 kg/m    Subjective:    Patient ID: Bradley Wong, male    DOB: 03/21/1946, 73 y.o.   MRN: [id number]  HPI: Bradley Wong is a 73 y.o. male presenting on 11/08/2018 for Fever (101 yesterday . Patient states it started yesterday ); Chills; Abdominal Pain; and Nausea Patient comes in for a 1 day episode of a mild viral illness.  He never vomited.  He did feel a little bit of nausea.  He denies diarrhea.  He did have a fever yesterday.  He has not had his flu vaccine.  And he is wanting to get that.  I think that would be appropriate at this time.  He has no other complaints at this time.  He actually looks and seems quite healthy in the office visit.   Past Medical History:  Diagnosis Date  . Diabetes mellitus without complication ([place])   . Dyslipidemia   . History of colon polyps   . History of seasonal allergies   . History of thyroid cancer 2000   status post thyroidectomy  . HTN (hypertension)   . Hyperlipidemia   . Thyroid disease    Relevant past medical, surgical, family and social history reviewed and updated as indicated. Interim medical history since our last visit reviewed. Allergies and medications reviewed and updated. DATA REVIEWED: CHART IN EPIC  Family History reviewed for pertinent findings.  Review of Systems  Constitutional: Negative.  Negative for appetite change and fatigue.  HENT: Negative.   Eyes: Negative.  Negative for pain and visual disturbance.  Respiratory: Negative.  Negative for cough, chest tightness, shortness of breath and wheezing.   Cardiovascular: Negative.  Negative for chest pain, palpitations and leg swelling.  Gastrointestinal: Negative.  Negative for abdominal pain, diarrhea, nausea and vomiting.  Endocrine: Negative.   Genitourinary: Negative.   Musculoskeletal: Negative.   Skin: Negative.  Negative for color  change and rash.  Neurological: Negative.  Negative for weakness, numbness and headaches.  Psychiatric/Behavioral: Negative.     Allergies as of 11/08/2018      Reactions   Niaspan [niacin Er] Other (See Comments)   flushing   Penicillins Rash   Has patient had a PCN reaction causing immediate rash, facial/tongue/throat swelling, SOB or lightheadedness with hypotension: No Has patient had a PCN reaction causing severe rash involving mucus membranes or skin necrosis: No Has patient had a PCN reaction that required hospitalization: Unknown Has patient had a PCN reaction occurring within the last 10 years: No If all of the above answers are "NO", then may proceed with Cephalosporin use.      Medication List       Accurate as of November 08, 2018  8:47 AM. Always use your most recent med list.        aspirin 81 MG tablet Take 81 mg by mouth daily.   HYDROcodone-acetaminophen 10-325 MG tablet Commonly known as:  NORCO Take 1 tablet by mouth every 6 (six) hours as needed for severe pain.   levothyroxine 125 MCG tablet Commonly known as:  SYNTHROID, LEVOTHROID Take 1 tablet (125 mcg total) by mouth daily before breakfast.   metFORMIN 500 MG 24 hr tablet Commonly known as:  GLUCOPHAGE-XR TAKE (1) TABLET DAILY WITH BREAKFAST.   metoCLOPramide 10 MG tablet Commonly known  as:  REGLAN Take 5 mg by mouth every 6 (six) hours as needed for nausea.   olmesartan 40 MG tablet Commonly known as:  BENICAR TAKE 1 TABLET DAILY   ondansetron 4 MG disintegrating tablet Commonly known as:  ZOFRAN ODT Take 1 tablet (4 mg total) by mouth every 8 (eight) hours as needed for nausea or vomiting.   oxyCODONE-acetaminophen 5-325 MG tablet Commonly known as:  PERCOCET/ROXICET Take 1 tablet by mouth every 4 (four) hours as needed.   rosuvastatin 40 MG tablet Commonly known as:  CRESTOR Take 1 tablet (40 mg total) by mouth daily.   tamsulosin 0.4 MG Caps capsule Commonly known as:   FLOMAX Take 0.4 mg by mouth daily.   Vitamin D-3 125 MCG (5000 UT) Tabs Take 5,000 Units by mouth daily.          Objective:    BP 128/79   Pulse 77   Temp (!) 96.8 F (36 C) (Oral)   Ht 6\' 2"  (1.88 m)   Wt 211 lb 12.8 oz (96.1 kg)   BMI 27.19 kg/m   Allergies  Allergen Reactions  . Niaspan [Niacin Er] Other (See Comments)    flushing  . Penicillins Rash    Has patient had a PCN reaction causing immediate rash, facial/tongue/throat swelling, SOB or lightheadedness with hypotension: No Has patient had a PCN reaction causing severe rash involving mucus membranes or skin necrosis: No Has patient had a PCN reaction that required hospitalization: Unknown Has patient had a PCN reaction occurring within the last 10 years: No If all of the above answers are "NO", then may proceed with Cephalosporin use.     Wt Readings from Last 3 Encounters:  11/08/18 211 lb 12.8 oz (96.1 kg)  09/20/18 204 lb 9.6 oz (92.8 kg)  09/16/18 212 lb (96.2 kg)    Physical Exam Constitutional:      Appearance: He is well-developed.  HENT:     Head: Normocephalic and atraumatic.  Eyes:     Conjunctiva/sclera: Conjunctivae normal.     Pupils: Pupils are equal, round, and reactive to light.  Neck:     Musculoskeletal: Normal range of motion and neck supple.  Cardiovascular:     Rate and Rhythm: Normal rate and regular rhythm.     Heart sounds: Normal heart sounds.  Pulmonary:     Effort: Pulmonary effort is normal.     Breath sounds: Normal breath sounds.  Abdominal:     General: Bowel sounds are normal.     Palpations: Abdomen is soft.  Musculoskeletal: Normal range of motion.  Skin:    General: Skin is warm and dry.     Results for orders placed or performed during the hospital encounter of 09/30/18  Glucose, capillary  Result Value Ref Range   Glucose-Capillary 119 (H) 70 - 99 mg/dL      Assessment & Plan:   1. Viral illness Supportive care Continue bland diet.   Continue  all other maintenance medications as listed above.  Follow up plan: No follow-ups on file.  Educational handout given for [place] PA-C [place] [address]  [place], [place] [address] [phone]   11/08/2018, 8:47 AM

## 2018-11-21 DIAGNOSIS — N200 Calculus of kidney: Secondary | ICD-10-CM | POA: Diagnosis not present

## 2018-12-04 ENCOUNTER — Ambulatory Visit (INDEPENDENT_AMBULATORY_CARE_PROVIDER_SITE_OTHER): Payer: Medicare Other | Admitting: Family Medicine

## 2018-12-04 ENCOUNTER — Encounter: Payer: Self-pay | Admitting: Family Medicine

## 2018-12-04 VITALS — BP 109/62 | HR 76 | Temp 97.4°F | Ht 74.0 in | Wt 209.0 lb

## 2018-12-04 DIAGNOSIS — I700 Atherosclerosis of aorta: Secondary | ICD-10-CM | POA: Diagnosis not present

## 2018-12-04 DIAGNOSIS — R972 Elevated prostate specific antigen [PSA]: Secondary | ICD-10-CM | POA: Diagnosis not present

## 2018-12-04 DIAGNOSIS — E7800 Pure hypercholesterolemia, unspecified: Secondary | ICD-10-CM | POA: Diagnosis not present

## 2018-12-04 DIAGNOSIS — D696 Thrombocytopenia, unspecified: Secondary | ICD-10-CM

## 2018-12-04 DIAGNOSIS — E559 Vitamin D deficiency, unspecified: Secondary | ICD-10-CM

## 2018-12-04 DIAGNOSIS — N200 Calculus of kidney: Secondary | ICD-10-CM

## 2018-12-04 DIAGNOSIS — E118 Type 2 diabetes mellitus with unspecified complications: Secondary | ICD-10-CM

## 2018-12-04 DIAGNOSIS — C73 Malignant neoplasm of thyroid gland: Secondary | ICD-10-CM | POA: Diagnosis not present

## 2018-12-04 DIAGNOSIS — I10 Essential (primary) hypertension: Secondary | ICD-10-CM | POA: Diagnosis not present

## 2018-12-04 DIAGNOSIS — M436 Torticollis: Secondary | ICD-10-CM

## 2018-12-04 LAB — BAYER DCA HB A1C WAIVED: HB A1C (BAYER DCA - WAIVED): 6.3 % (ref <7.0)

## 2018-12-04 MED ORDER — HYDROCODONE-ACETAMINOPHEN 10-325 MG PO TABS: 1.0000 | ORAL_TABLET | Freq: Four times a day (QID) | ORAL | 0 refills | Status: DC | PRN

## 2018-12-04 NOTE — Addendum Note (Signed)
Addended by: Chipper Herb on: 12/04/2018 11:44 AM   Modules accepted: Orders

## 2018-12-04 NOTE — Patient Instructions (Addendum)
Medicare Annual Wellness Visit  [facility] and the medical providers at [facility] strive to bring you the best medical care.  In doing so we not only want to address your current medical conditions and concerns but also to detect new conditions early and prevent illness, disease and health-related problems.    Medicare offers a yearly Wellness Visit which allows our clinical staff to assess your need for preventative services including immunizations, lifestyle education, counseling to decrease risk of preventable diseases and screening for fall risk and other medical concerns.    This visit is provided free of charge (no copay) for all Medicare recipients. The clinical pharmacists at [facility] have begun to conduct these Wellness Visits which will also include a thorough review of all your medications.    As you primary medical provider recommend that you make an appointment for your Annual Wellness Visit if you have not done so already this year.  You may set up this appointment before you leave today or you may call back ([phone]) and schedule an appointment.  Please make sure when you call that you mention that you are scheduling your Annual Wellness Visit with the clinical pharmacist so that the appointment may be made for the proper length of time.     Continue current medications. Continue good therapeutic lifestyle changes which include good diet and exercise. Fall precautions discussed with patient. If an FOBT was given today- please return it to our front desk. If you are over 71 years old - you may need Prevnar 66 or the adult Pneumonia vaccine.  **Flu shots are available--- please call and schedule a FLU-CLINIC appointment**  After your visit with [place] today you will receive a survey in the mail or online from [employer] regarding your care with [place]. Please take a moment to fill this out. Your feedback is very  important to [place] as you can help [place] better understand your patient needs as well as improve your experience and satisfaction. WE CARE ABOUT YOU!!!   Follow-up with gastroenterologist for repeat colonoscopy in June 2024. Check an occasional blood sugar at home fasting Take pain medicine only if needed Follow-up with urology as planned Please do not forget to get a good eye exam and have the eye doctor to send [place] a copy of that report for our records.

## 2018-12-04 NOTE — Progress Notes (Signed)
Subjective:    Patient ID: Bradley Wong, male    DOB: [birth year], 73 y.o.   MRN: [id number]  HPI Pt here for follow up and management of chronic medical problems which includes diabetes, hypertension and hyperlipidemia. He is taking medication regularly.  Patient is doing well overall and has no specific complaints today he is needing a prescription for his hydrocodone which he takes for his back.  He has had lab work done and all of his vital signs are stable and he has a BMI that is 27.  Patient does have a history of thyroid cancer along with hyperlipidemia and hypertension and recently diagnosed diabetes mellitus which runs in his family.  He is followed by his thyroid condition by an endocrinologist at [facility].  The patient has had kidney stones and is being followed regularly by the urologist who did his last rectal exam and August 2019.  He has 2 additional stones still present and has plans to follow-up with a urologist again in about 3 months.  Today he denies any chest pain pressure tightness or shortness of breath.  He denies any trouble with nausea vomiting diarrhea blood in the stool or black tarry bowel movements.  There is no change in bowel habits.  His next colonoscopy is not due until 2024.  Dr. Carlean Purl did his last exam.  He is passing his water well and is taking Flomax for this.  He has his chronic back pain problems off and on and takes an occasional hydrocodone for this.  He does not check his blood sugars at home regularly and says that he is taking his metformin once daily.    Patient Active Problem List   Diagnosis Date Noted  . Pre-diabetes 06/25/2015  . Thrombocytopenia ([place]) 02/04/2015  . Thyroid cancer ([place]) 09/04/2014  . Hyperlipemia 09/04/2014  . Thyroid activity decreased 09/04/2014  . Osteoarthritis of spine with radiculopathy, lumbar region 09/04/2014  . Osteoarthritis of lumbar spine 05/12/2014  . Vitamin D deficiency  05/12/2014  . Metabolic syndrome 53/61/4431  . Elevated PSA 01/22/2013  . Essential hypertension, benign 01/06/2013   Outpatient Encounter Medications as of 12/04/2018  Medication Sig  . aspirin 81 MG tablet Take 81 mg by mouth daily.  . Cholecalciferol (VITAMIN D-3) 5000 UNITS TABS Take 5,000 Units by mouth daily.   Marland Kitchen HYDROcodone-acetaminophen (NORCO) 10-325 MG tablet Take 1 tablet by mouth every 6 (six) hours as needed for severe pain.  Marland Kitchen levothyroxine (SYNTHROID, LEVOTHROID) 125 MCG tablet Take 1 tablet (125 mcg total) by mouth daily before breakfast.  . metFORMIN (GLUCOPHAGE-XR) 500 MG 24 hr tablet TAKE (1) TABLET DAILY WITH BREAKFAST. (Patient taking differently: Take 500 mg by mouth daily with breakfast. )  . olmesartan (BENICAR) 40 MG tablet TAKE 1 TABLET DAILY (Patient taking differently: Take 20 mg by mouth daily. )  . rosuvastatin (CRESTOR) 40 MG tablet Take 1 tablet (40 mg total) by mouth daily. (Patient taking differently: Take 20 mg by mouth daily. )  . tamsulosin (FLOMAX) 0.4 MG CAPS capsule Take 0.4 mg by mouth daily.  . [DISCONTINUED] levothyroxine (SYNTHROID) 125 MCG tablet Take 1 tablet by mouth daily.  . [DISCONTINUED] metoCLOPramide (REGLAN) 10 MG tablet Take 5 mg by mouth every 6 (six) hours as needed for nausea.  . [DISCONTINUED] ondansetron (ZOFRAN ODT) 4 MG disintegrating tablet Take 1 tablet (4 mg total) by mouth every 8 (eight) hours as needed for nausea or vomiting.  . [DISCONTINUED] oxyCODONE-acetaminophen (PERCOCET/ROXICET)  5-325 MG tablet Take 1 tablet by mouth every 4 (four) hours as needed. (Patient taking differently: Take 1 tablet by mouth every 4 (four) hours as needed for moderate pain. )   No facility-administered encounter medications on file as of 12/04/2018.      Review of Systems  Constitutional: Negative.   HENT: Negative.   Eyes: Negative.   Respiratory: Negative.   Cardiovascular: Negative.   Gastrointestinal: Negative.   Endocrine: Negative.     Genitourinary: Negative.   Musculoskeletal: Negative.   Skin: Negative.   Allergic/Immunologic: Negative.   Neurological: Negative.   Hematological: Negative.   Psychiatric/Behavioral: Negative.        Objective:   Physical Exam Vitals signs and nursing note reviewed.  Constitutional:      Appearance: Normal appearance. He is well-developed and normal weight. He is not ill-appearing or diaphoretic.     Comments: Smiling pleasant and alert.  HENT:     Head: Normocephalic and atraumatic.     Right Ear: Tympanic membrane, ear canal and external ear normal. There is no impacted cerumen.     Left Ear: Tympanic membrane, ear canal and external ear normal. There is no impacted cerumen.     Nose: Nose normal.     Mouth/Throat:     Mouth: Mucous membranes are moist.     Pharynx: Oropharynx is clear. No oropharyngeal exudate.  Eyes:     General: No scleral icterus.       Right eye: No discharge.        Left eye: No discharge.     Extraocular Movements: Extraocular movements intact.     Conjunctiva/sclera: Conjunctivae normal.     Pupils: Pupils are equal, round, and reactive to light.     Comments: Patient needs eye exam and I reminded him of this today.  Neck:     Musculoskeletal: Normal range of motion and neck supple. No neck rigidity or muscular tenderness.     Thyroid: No thyromegaly.     Vascular: No carotid bruit.     Trachea: No tracheal deviation.     Comments: No bruits thyromegaly or anterior cervical adenopathy.  Patient does complain of some neck pain at times but repositioning his neck this goes away.  I told him that this gets worse or more progressive that he would need to have C-spine films.  No thyromegaly as mentioned and patient does see Dr. Aline Brochure the endocrinologist regularly once a year at [facility]. Cardiovascular:     Rate and Rhythm: Normal rate and regular rhythm.     Pulses: Normal pulses.     Heart sounds: Normal  heart sounds. No murmur. No gallop.      Comments: The heart is regular at 72/min with good pedal pulses bilaterally Pulmonary:     Effort: Pulmonary effort is normal. No respiratory distress.     Breath sounds: Normal breath sounds. No wheezing or rales.     Comments:  NoAxillary adenopathy chest wall masses or tenderness with good breath sounds anteriorly and posteriorly. Chest:     Chest wall: No tenderness.  Abdominal:     General: Abdomen is flat. Bowel sounds are normal.     Palpations: Abdomen is soft. There is no mass.     Tenderness: There is no abdominal tenderness. There is no guarding.  Genitourinary:    Comments: This is followed regularly by the urologist every August and he gets more frequent exams recently because of kidney stones.  Musculoskeletal: Normal range of motion.        General: No tenderness.     Right lower leg: No edema.  Lymphadenopathy:     Cervical: No cervical adenopathy.  Skin:    General: Skin is warm and dry.     Findings: No rash.  Neurological:     General: No focal deficit present.     Mental Status: He is alert and oriented to person, place, and time. Mental status is at baseline.     Cranial Nerves: No cranial nerve deficit.     Sensory: No sensory deficit.     Coordination: Coordination normal.     Deep Tendon Reflexes: Reflexes are normal and symmetric. Reflexes normal.  Psychiatric:        Mood and Affect: Mood normal.        Behavior: Behavior normal.        Thought Content: Thought content normal.        Judgment: Judgment normal.     Comments: Mood affect and behavior for this patient are all normal.    BP 109/62 (BP Location: Left Arm)   Pulse 76   Temp (!) 97.4 F (36.3 C) (Oral)   Ht '6\' 2"'$  (1.88 m)   Wt 209 lb (94.8 kg)   BMI 26.83 kg/m         Assessment & Plan:  1. Essential hypertension, benign -The blood pressure is good today and he will continue with current treatment - BMP8+EGFR - CBC with  Differential/Platelet - Hepatic function panel  2. Pure hypercholesterolemia -Continue with cholesterol treatment and as aggressive therapeutic lifestyle changes including diet and exercise to achieve weight loss - CBC with Differential/Platelet - Lipid panel  3. Thrombocytopenia ([facility]) -No increased bruising or bleeding noted. - CBC with Differential/Platelet  4. Vitamin D deficiency -Continue with vitamin D replacement - CBC with Differential/Platelet - VITAMIN D 25 Hydroxy (Vit-D Deficiency, Fractures)  5. Thyroid cancer ([place]) -Continue yearly follow-up with Dr. Aline Brochure, endocrinologist at [facility] with Differential/Platelet  6. Controlled type 2 diabetes mellitus with complication, without long-term current use of insulin ([place]) -Patient should check blood sugars periodically at home and continue to follow aggressive therapeutic lifestyle changes - CBC with Differential/Platelet - Bayer DCA Hb A1c Waived  7. Elevated PSA -Follow-up with Dr. Jeffie Pollock as planned - CBC with Differential/Platelet  8. Neck stiffness -Use warm wet compresses and if this discomfort becomes more frequent schedule C-spine films  9. Kidney stones -Follow-up with urology as planned  10.  Aortic atherosclerosis -Continue aggressive therapeutic lifestyle changes and statin therapy  No orders of the defined types were placed in this encounter.  Patient Instructions                       Medicare Annual Wellness Visit  [facility] and the medical providers at [facility] strive to bring you the best medical care.  In doing so we not only want to address your current medical conditions and concerns but also to detect new conditions early and prevent illness, disease and health-related problems.    Medicare offers a yearly Wellness Visit which allows our clinical staff to assess your need for preventative services including immunizations,  lifestyle education, counseling to decrease risk of preventable diseases and screening for fall risk and other medical concerns.    This visit is provided free of charge (no copay) for all Medicare recipients. The clinical  pharmacists at [facility] have begun to conduct these Wellness Visits which will also include a thorough review of all your medications.    As you primary medical provider recommend that you make an appointment for your Annual Wellness Visit if you have not done so already this year.  You may set up this appointment before you leave today or you may call back ([phone]) and schedule an appointment.  Please make sure when you call that you mention that you are scheduling your Annual Wellness Visit with the clinical pharmacist so that the appointment may be made for the proper length of time.     Continue current medications. Continue good therapeutic lifestyle changes which include good diet and exercise. Fall precautions discussed with patient. If an FOBT was given today- please return it to our front desk. If you are over 48 years old - you may need Prevnar 53 or the adult Pneumonia vaccine.  **Flu shots are available--- please call and schedule a FLU-CLINIC appointment**  After your visit with [place] today you will receive a survey in the mail or online from [employer] regarding your care with [place]. Please take a moment to fill this out. Your feedback is very important to [place] as you can help [place] better understand your patient needs as well as improve your experience and satisfaction. WE CARE ABOUT YOU!!!   Follow-up with gastroenterologist for repeat colonoscopy in June 2024. Check an occasional blood sugar at home fasting Take pain medicine only if needed Follow-up with urology as planned Please do not forget to get a good eye exam and have the eye doctor to send [place] a copy of that report for our records.  Arrie Senate MD

## 2018-12-04 NOTE — Addendum Note (Signed)
Addended by: Zannie Cove on: 12/04/2018 11:41 AM   Modules accepted: Orders

## 2018-12-05 LAB — CBC WITH DIFFERENTIAL/PLATELET
Basophils Absolute: 0 10*3/uL (ref 0.0–0.2)
Basos: 1 %
EOS (ABSOLUTE): 0.1 10*3/uL (ref 0.0–0.4)
Eos: 2 %
Hematocrit: 40.3 % (ref 37.5–51.0)
Hemoglobin: 13.9 g/dL (ref 13.0–17.7)
IMMATURE GRANULOCYTES: 0 %
Immature Grans (Abs): 0 10*3/uL (ref 0.0–0.1)
Lymphocytes Absolute: 1.5 10*3/uL (ref 0.7–3.1)
Lymphs: 28 %
MCH: 33.5 pg — ABNORMAL HIGH (ref 26.6–33.0)
MCHC: 34.5 g/dL (ref 31.5–35.7)
MCV: 97 fL (ref 79–97)
MONOS ABS: 0.5 10*3/uL (ref 0.1–0.9)
Monocytes: 9 %
NEUTROS PCT: 60 %
Neutrophils Absolute: 3.3 10*3/uL (ref 1.4–7.0)
PLATELETS: 162 10*3/uL (ref 150–450)
RBC: 4.15 x10E6/uL (ref 4.14–5.80)
RDW: 13.1 % (ref 11.6–15.4)
WBC: 5.4 10*3/uL (ref 3.4–10.8)

## 2018-12-05 LAB — LIPID PANEL
Chol/HDL Ratio: 2.4 ratio (ref 0.0–5.0)
Cholesterol, Total: 85 mg/dL — ABNORMAL LOW (ref 100–199)
HDL: 35 mg/dL — ABNORMAL LOW (ref >39)
LDL Calculated: 35 mg/dL (ref 0–99)
Triglycerides: 73 mg/dL (ref 0–149)
VLDL Cholesterol Cal: 15 mg/dL (ref 5–40)

## 2018-12-05 LAB — HEPATIC FUNCTION PANEL
ALBUMIN: 4.2 g/dL (ref 3.8–4.8)
ALT: 28 IU/L (ref 0–44)
AST: 20 IU/L (ref 0–40)
Alkaline Phosphatase: 51 IU/L (ref 39–117)
Bilirubin Total: 0.5 mg/dL (ref 0.0–1.2)
Bilirubin, Direct: 0.19 mg/dL (ref 0.00–0.40)
Total Protein: 6.4 g/dL (ref 6.0–8.5)

## 2018-12-05 LAB — BMP8+EGFR
BUN/Creatinine Ratio: 14 (ref 10–24)
BUN: 14 mg/dL (ref 8–27)
CO2: 18 mmol/L — AB (ref 20–29)
Calcium: 9.2 mg/dL (ref 8.6–10.2)
Chloride: 107 mmol/L — ABNORMAL HIGH (ref 96–106)
Creatinine, Ser: 1.01 mg/dL (ref 0.76–1.27)
GFR calc Af Amer: 87 mL/min/{1.73_m2} (ref >59)
GFR calc non Af Amer: 75 mL/min/{1.73_m2} (ref >59)
Glucose: 110 mg/dL — ABNORMAL HIGH (ref 65–99)
Potassium: 4.5 mmol/L (ref 3.5–5.2)
Sodium: 143 mmol/L (ref 134–144)

## 2018-12-05 LAB — VITAMIN D 25 HYDROXY (VIT D DEFICIENCY, FRACTURES): Vit D, 25-Hydroxy: 56.5 ng/mL (ref 30.0–100.0)

## 2018-12-26 ENCOUNTER — Other Ambulatory Visit: Payer: Self-pay | Admitting: Family Medicine

## 2019-02-12 ENCOUNTER — Encounter: Payer: Self-pay | Admitting: *Deleted

## 2019-03-04 ENCOUNTER — Encounter: Payer: Medicare Other | Admitting: *Deleted

## 2019-03-28 ENCOUNTER — Other Ambulatory Visit: Payer: Self-pay | Admitting: Family Medicine

## 2019-03-28 ENCOUNTER — Encounter: Payer: Medicare Other | Admitting: *Deleted

## 2019-04-02 ENCOUNTER — Telehealth: Payer: Self-pay | Admitting: Family Medicine

## 2019-04-03 ENCOUNTER — Encounter: Payer: Self-pay | Admitting: *Deleted

## 2019-04-07 ENCOUNTER — Encounter: Payer: Self-pay | Admitting: *Deleted

## 2019-04-07 ENCOUNTER — Other Ambulatory Visit: Payer: Self-pay

## 2019-04-07 ENCOUNTER — Ambulatory Visit (INDEPENDENT_AMBULATORY_CARE_PROVIDER_SITE_OTHER): Payer: Medicare Other | Admitting: *Deleted

## 2019-04-07 VITALS — BP 116/76 | HR 77 | Ht 74.0 in | Wt 216.0 lb

## 2019-04-07 DIAGNOSIS — Z1159 Encounter for screening for other viral diseases: Secondary | ICD-10-CM

## 2019-04-07 DIAGNOSIS — Z0000 Encounter for general adult medical examination without abnormal findings: Secondary | ICD-10-CM

## 2019-04-07 NOTE — Patient Instructions (Signed)
  Bradley Wong , Thank you for taking time to come for your Medicare Wellness Visit. I appreciate your ongoing commitment to your health goals. Please review the following plan we discussed and let me know if I can assist you in the future.   These are the goals we discussed: Goals             . 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       Please review the information given on Advance Directives.  If you complete the paperwork, please bring a copy to our office to be filed in your medical record.      This is a list of the screening recommended for you and due dates:  Health Maintenance  Topic Date Due  .  Hepatitis C: One time screening is recommended by Center for Disease Control  (CDC) for  adults born from [birth year] through 1963.   [birth year]  . Flu Shot  05/31/2019  . Colon Cancer Screening  04/26/2023  . Tetanus Vaccine  06/05/2028  . Pneumonia vaccines  Completed

## 2019-04-07 NOTE — Progress Notes (Addendum)
Medicare Annual Wellness Visit  Subjective:   Bradley Wong is a 73 y.o. male who presents for a subsequent Medicare Annual Wellness Visit.  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  He lives with his wife.  They have 4 adult children, and 5 grandchildren whom they see often.  He enjoys working on projects around his home and taking care of their swimming pool.  He is currently working on a shop at his home.  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  Patient Care Team: Chipper Herb, MD as PCP - General (Family Medicine) Sherren Mocha, MD as PCP - Cardiology (Cardiology) Kristian Covey, MD (Internal Medicine) Irine Seal, MD (Urology) Melina Schools, OD (Optometry) Bjorn Loser, MD as Consulting Physician (Urology)  Hospitalizations, surgeries, and ER visits in previous 12 months No hospitalizations, ER visits, or surgeries this past year.   Review of Systems    Patient reports that his overall health is unchanged compared to last year.  Cardiac Risk Factors include: advanced age (>67men, >93 women);dyslipidemia;hypertension;male gender    All other systems negative       Current Medications (verified) Outpatient Encounter Medications as of 04/07/2019  Medication Sig  . aspirin 81 MG tablet Take 81 mg by mouth daily.  . Cholecalciferol (VITAMIN D-3) 5000 UNITS TABS Take 5,000 Units by mouth daily.   Marland Kitchen HYDROcodone-acetaminophen (NORCO) 10-325 MG tablet Take 1 tablet by mouth every 6 (six) hours as needed for severe pain.  Marland Kitchen levothyroxine (SYNTHROID, LEVOTHROID) 125 MCG tablet Take 1 tablet (125 mcg total) by mouth daily before breakfast.  . metFORMIN (GLUCOPHAGE-XR) 500 MG 24 hr tablet TAKE (1) TABLET DAILY WITH BREAKFAST.  Marland Kitchen olmesartan (BENICAR) 40 MG tablet TAKE 1 TABLET DAILY  . rosuvastatin (CRESTOR) 40 MG tablet TAKE 1 TABLET DAILY  . tamsulosin (FLOMAX) 0.4 MG CAPS capsule Take 0.4 mg  by mouth daily.   No facility-administered encounter medications on file as of 04/07/2019.     Allergies (verified) Niaspan [niacin er] and Penicillins   History: Past Medical History:  Diagnosis Date  . Diabetes mellitus without complication ([place])   . Dyslipidemia   . History of colon polyps   . History of seasonal allergies   . History of thyroid cancer 2000   status post thyroidectomy  . HTN (hypertension)   . Hyperlipidemia   . Thyroid disease    Past Surgical History:  Procedure Laterality Date  . CYSTOSCOPY WITH STENT PLACEMENT Left 09/20/2018   Procedure: CYSTOSCOPY WITH STENT PLACEMENT;  Surgeon: Bjorn Loser, MD;  Location: [facility];  Service: Urology;  Laterality: Left;  . KNEE ARTHROSCOPY AND ARTHROTOMY Right 1979  . PROSTATE BIOPSY  01/2013  . THYROIDECTOMY Bilateral 2000   THYROID CA  . TONSILLECTOMY Bilateral childhood   Family History  Problem Relation Age of Onset  . Coronary artery disease Mother 37  . Hypertension Mother   . Congestive Heart Failure Mother   . Cancer Father 53       throat and bone cancer  . Diabetes Brother        deceased  . Heart disease Brother   . Kidney failure Brother   . Diabetes Maternal Grandmother   . Cancer Paternal Grandfather        leukemia  . Diabetes Brother   . Heart disease Brother   . Kidney failure Brother   . Colon cancer Neg Hx   .  Rectal cancer Neg Hx   . Stomach cancer Neg Hx    Social History   Socioeconomic History  . Marital status: Married    Spouse name: [name]  . Number of children: 4  . Years of education: 39  . Highest education level: Associate degree: occupational, Hotel manager, or vocational program  Occupational History  . 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 Financial resource strain: Not hard at all  . Food insecurity:    Worry: Never true    Inability: Never true  . Transportation needs:    Medical: No    Non-medical:  No  Tobacco Use  . Smoking status: Never Smoker  . Smokeless tobacco: Never Used  Substance and Sexual Activity  . Alcohol use: Not Currently  . Drug use: No  . Sexual activity: Not on file  Lifestyle  . Physical activity:    Days per week: 3 days    Minutes per session: 60 min  . Stress: Not at all  Relationships  . Social connections:    Talks on phone: More than three times a week    Gets together: More than three times a week    Attends religious service: More than 4 times per year    Active member of club or organization: Yes    Attends meetings of clubs or organizations: More than 4 times per year    Relationship status: Married  Other Topics Concern  . Not on file  Social History Narrative  . Not on file     Clinical Intake:     Pain Score: 7                   Activities of Daily Living In your present state of health, do you have any difficulty performing the following activities: 04/07/2019 09/30/2018  Hearing? N N  Vision? N N  Difficulty concentrating or making decisions? N N  Walking or climbing stairs? Y N  Comment Due to left knee pain -  Dressing or bathing? N N  Doing errands, shopping? N -  Preparing Food and eating ? N -  Using the Toilet? N -  In the past six months, have you accidently leaked urine? N -  Do you have problems with loss of bowel control? N -  Managing your Medications? N -  Managing your Finances? N -  Housekeeping or managing your Housekeeping? N -  Some recent data might be hidden     Exercise Current Exercise Habits: The patient does not participate in regular exercise at present, Exercise limited by: orthopedic condition(s)  Diet Consumes 3 meals a day and 1 snacks a day.  The patient feels that he mostly follow a Regular diet.  Diet History Patient monitors carbohydrate intake- he states he does not eat a significant amount of sweets, breads, or pastas   Depression Screen PHQ 2/9 Scores 04/07/2019  12/04/2018 06/05/2018 01/24/2018 12/05/2017 04/18/2017 11/29/2016  PHQ - 2 Score 0 0 0 0 0 0 0     Fall Risk Fall Risk  04/07/2019 12/04/2018 06/05/2018 01/24/2018 12/05/2017  Falls in the past year? 0 0 No No No     Objective:    Today's Vitals   04/07/19 1111  PainSc: 7   PainLoc: Knee   There is no height or weight on file to calculate BMI.  Advanced Directives 04/07/2019 09/30/2018 09/20/2018 09/16/2018 01/24/2018  Does Patient Have  a Medical Advance Directive? No Yes Yes No No  Type of Advance Directive - [place];Living will [place];Living will - -  Does patient want to make changes to medical advance directive? - No - Patient declined - - -  Copy of [place] in Chart? - No - copy requested No - copy requested - -  Would patient like information on creating a medical advance directive? Yes (MAU/Ambulatory/Procedural Areas - Information given) - - - Yes (MAU/Ambulatory/Procedural Areas - Information given)    Hearing/Vision  No hearing or vision deficits noted during visit.  Cognitive Function: MMSE - Mini Mental State Exam 01/24/2018  Orientation to time 5  Orientation to Place 5  Registration 3  Attention/ Calculation 5  Recall 3  Language- name 2 objects 2  Language- repeat 1  Language- follow 3 step command 3  Language- read & follow direction 1  Write a sentence 1  Copy design 1  Total score 30     6CIT Screen 04/07/2019  What Year? 0 points  What month? 0 points  What time? 0 points  Count back from 20 0 points  Months in reverse 0 points  Repeat phrase 0 points  Total Score 0   Normal Cognitive Function Screening: Yes    Immunizations and Health Maintenance Immunization History  Administered Date(s) Administered  . Influenza Split 08/02/2013  . Influenza, High Dose Seasonal PF 07/26/2016, 11/08/2018  . Influenza,inj,Quad PF,6+ Mos 08/19/2014  . Pneumococcal Conjugate-13 12/31/2013  . Pneumococcal  Polysaccharide-23 11/29/2016  . Td 06/05/2018  . Tdap 12/02/2006   Health Maintenance Due  Topic Date Due  . Hepatitis C Screening  Mar 25, 1946   Health Maintenance  Topic Date Due  . Hepatitis C Screening  04-Oct-1946  . INFLUENZA VACCINE  05/31/2019  . COLONOSCOPY  04/26/2023  . TETANUS/TDAP  06/05/2028  . PNA vac Low Risk Adult  Completed   Future Hepatitis C lab test ordered     Assessment:   This is a routine wellness examination for [facility].    Plan:    Goals             . 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        Health Maintenance & Additional Screening Recommendations: Advanced directives: has NO advanced directive  - add't info requested. Referral to SW: no  Lung: Low Dose CT Chest recommended if Age 30-80 years, 30 pack-year currently smoking OR have quit w/in 15years. Patient does not qualify. Hepatitis C Screening recommended: Recommend at next in person office visit.  Future order entered.   Today's Orders Orders Placed This Encounter  Procedures  . Hepatitis C antibody    Standing Status:   Future    Standing Expiration Date:   04/06/2020    Keep f/u with Dr. Vonna Kotyk Dettinger and any other specialty appointments you may have Continue current medications Move carefully to avoid falls. Use assistive devices like a cane or walker if needed. Aim for at least 150 minutes of moderate activity a week. This can be done with chair exercises if necessary. Read or work on puzzles daily Stay connected with friends and family  I have personally reviewed and noted the following in the patient's chart:   . Medical and social history . Use of alcohol, tobacco or illicit drugs  . Current medications and supplements . Functional ability and status . Nutritional status .  Physical activity . Advanced directives . List of other physicians . Hospitalizations, surgeries, and ER visits in previous 12 months .  Vitals . Screenings to include cognitive, depression, and falls . Referrals and appointments  In addition, I have reviewed and discussed with patient certain preventive protocols, quality metrics, and best practice recommendations. A written personalized care plan for preventive services as well as general preventive health recommendations were provided to patient.     Nolberto Hanlon, RN  04/07/2019   I have reviewed and agree with the above AWV documentation.   Mary-Margaret Hassell Done, FNP

## 2019-04-23 DIAGNOSIS — N200 Calculus of kidney: Secondary | ICD-10-CM | POA: Diagnosis not present

## 2019-04-23 DIAGNOSIS — N5201 Erectile dysfunction due to arterial insufficiency: Secondary | ICD-10-CM | POA: Diagnosis not present

## 2019-04-23 DIAGNOSIS — R972 Elevated prostate specific antigen [PSA]: Secondary | ICD-10-CM | POA: Diagnosis not present

## 2019-06-11 ENCOUNTER — Ambulatory Visit: Payer: Medicare Other | Admitting: Family Medicine

## 2019-06-12 ENCOUNTER — Telehealth: Payer: Self-pay | Admitting: Family Medicine

## 2019-06-12 ENCOUNTER — Other Ambulatory Visit: Payer: Self-pay

## 2019-06-13 ENCOUNTER — Ambulatory Visit (INDEPENDENT_AMBULATORY_CARE_PROVIDER_SITE_OTHER): Payer: Medicare Other | Admitting: Family Medicine

## 2019-06-13 ENCOUNTER — Encounter: Payer: Self-pay | Admitting: Family Medicine

## 2019-06-13 VITALS — BP 122/76 | HR 63 | Temp 97.5°F | Ht 74.0 in | Wt 218.2 lb

## 2019-06-13 DIAGNOSIS — R972 Elevated prostate specific antigen [PSA]: Secondary | ICD-10-CM | POA: Diagnosis not present

## 2019-06-13 DIAGNOSIS — E7800 Pure hypercholesterolemia, unspecified: Secondary | ICD-10-CM

## 2019-06-13 DIAGNOSIS — R7303 Prediabetes: Secondary | ICD-10-CM | POA: Diagnosis not present

## 2019-06-13 DIAGNOSIS — I10 Essential (primary) hypertension: Secondary | ICD-10-CM | POA: Diagnosis not present

## 2019-06-13 LAB — BAYER DCA HB A1C WAIVED: HB A1C (BAYER DCA - WAIVED): 6.5 % (ref <7.0)

## 2019-06-13 MED ORDER — ROSUVASTATIN CALCIUM 40 MG PO TABS: 40.0000 mg | ORAL_TABLET | Freq: Every day | ORAL | 3 refills | Status: DC

## 2019-06-13 MED ORDER — METFORMIN HCL ER 500 MG PO TB24: ORAL_TABLET | ORAL | 3 refills | Status: DC

## 2019-06-13 MED ORDER — OLMESARTAN MEDOXOMIL 40 MG PO TABS: 40.0000 mg | ORAL_TABLET | Freq: Every day | ORAL | 3 refills | Status: DC

## 2019-06-13 MED ORDER — LEVOTHYROXINE SODIUM 125 MCG PO TABS: 125.0000 ug | ORAL_TABLET | Freq: Every day | ORAL | 3 refills | Status: DC

## 2019-06-13 NOTE — Progress Notes (Signed)
BP 122/76   Pulse 63   Temp (!) 97.5 F (36.4 C) (Temporal)   Ht _0  (1.88 m)   Wt 218 lb 3.2 oz (99 kg)   BMI 28.02 kg/m    Subjective:   Patient ID: Bradley Wong, male    DOB: 07-24-1946, 73 y.o.   MRN: [id number]  HPI: Bradley Wong is a 73 y.o. male presenting on 06/13/2019 for Hypertension (6 month follow up) and Establish Care ([facility])   HPI Hypertension Patient is currently on olmesartan, and their blood pressure today is 22/76. Patient denies any lightheadedness or dizziness. Patient denies headaches, blurred vision, chest pains, shortness of breath, or weakness. Denies any side effects from medication and is content with current medication.   Hyperlipidemia Patient is coming in for recheck of his hyperlipidemia. The patient is currently taking Crestor. They deny any issues with myalgias or history of liver damage from it. They deny any focal numbness or weakness or chest pain.   Prediabetes Patient comes in today for recheck of his diabetes. Patient has been currently taking metformin. Patient is currently on an ACE inhibitor/ARB. Patient has not seen an ophthalmologist this year. Patient denies any issues with their feet.   Patient has an elevated PSA and is due for recheck today.  He has been checking every 6 months.  Relevant past medical, surgical, family and social history reviewed and updated as indicated. Interim medical history since our last visit reviewed. Allergies and medications reviewed and updated.  Review of Systems  Constitutional: Negative for chills and fever.  Eyes: Negative for discharge.  Respiratory: Negative for shortness of breath and wheezing.   Cardiovascular: Negative for chest pain and leg swelling.  Genitourinary: Negative for decreased urine volume and difficulty urinating.  Musculoskeletal: Negative for back pain and gait problem.  Skin: Negative for rash.  Neurological: Negative for dizziness, weakness and light-headedness.   All other systems reviewed and are negative.   Per HPI unless specifically indicated above   Allergies as of 06/13/2019      Reactions   Niaspan [niacin Er] Other (See Comments)   flushing   Penicillins Rash   Has patient had a PCN reaction causing immediate rash, facial/tongue/throat swelling, SOB or lightheadedness with hypotension: No Has patient had a PCN reaction causing severe rash involving mucus membranes or skin necrosis: No Has patient had a PCN reaction that required hospitalization: Unknown Has patient had a PCN reaction occurring within the last 10 years: No If all of the above answers are "NO", then may proceed with Cephalosporin use.      Medication List       Accurate as of June 13, 2019  1:26 PM. If you have any questions, ask your nurse or doctor.        aspirin 81 MG tablet Take 81 mg by mouth daily.   HYDROcodone-acetaminophen 10-325 MG tablet Commonly known as: NORCO Take 1 tablet by mouth every 6 (six) hours as needed for severe pain.   levothyroxine 125 MCG tablet Commonly known as: SYNTHROID Take 1 tablet (125 mcg total) by mouth daily before breakfast.   metFORMIN 500 MG 24 hr tablet Commonly known as: GLUCOPHAGE-XR TAKE (1) TABLET DAILY WITH BREAKFAST.   olmesartan 40 MG tablet Commonly known as: BENICAR TAKE 1 TABLET DAILY   rosuvastatin 40 MG tablet Commonly known as: CRESTOR TAKE 1 TABLET DAILY   tamsulosin 0.4 MG Caps capsule Commonly known as: FLOMAX Take 0.4 mg by mouth daily.  Vitamin D-3 125 MCG (5000 UT) Tabs Take 5,000 Units by mouth daily.        Objective:   BP 122/76   Pulse 63   Temp (!) 97.5 F (36.4 C) (Temporal)   Ht _0  (1.88 m)   Wt 218 lb 3.2 oz (99 kg)   BMI 28.02 kg/m   Wt Readings from Last 3 Encounters:  06/13/19 218 lb 3.2 oz (99 kg)  04/07/19 216 lb (98 kg)  12/04/18 209 lb (94.8 kg)    Physical Exam Vitals signs and nursing note reviewed.  Constitutional:      General: He is not in  acute distress.    Appearance: He is well-developed. He is not diaphoretic.  Eyes:     General: No scleral icterus.    Conjunctiva/sclera: Conjunctivae normal.  Neck:     Musculoskeletal: Neck supple.     Thyroid: No thyromegaly.  Cardiovascular:     Rate and Rhythm: Normal rate and regular rhythm.     Heart sounds: Normal heart sounds. No murmur.  Pulmonary:     Effort: Pulmonary effort is normal. No respiratory distress.     Breath sounds: Normal breath sounds. No wheezing.  Musculoskeletal: Normal range of motion.  Lymphadenopathy:     Cervical: No cervical adenopathy.  Skin:    General: Skin is warm and dry.     Findings: No rash.  Neurological:     Mental Status: He is alert and oriented to person, place, and time.     Coordination: Coordination normal.  Psychiatric:        Behavior: Behavior normal.       Assessment & Plan:   Problem List Items Addressed This Visit      Cardiovascular and Mediastinum   Essential hypertension, benign (Chronic)   Relevant Medications   olmesartan (BENICAR) 40 MG tablet   rosuvastatin (CRESTOR) 40 MG tablet   Other Relevant Orders   CMP14+EGFR (Completed)     Other   Elevated PSA   Relevant Orders   PSA, total and free (Completed)   Hyperlipemia   Relevant Medications   olmesartan (BENICAR) 40 MG tablet   rosuvastatin (CRESTOR) 40 MG tablet   Other Relevant Orders   Lipid panel (Completed)   Pre-diabetes - Primary   Relevant Orders   CBC with Differential/Platelet (Completed)   Bayer DCA Hb A1c Waived (Completed)      Continue Crestor and Benicar and levothyroxine and metformin and tamsulosin, no changes in medication. Follow up plan: Return in about 6 months (around 12/14/2019), or if symptoms worsen or fail to improve, for htn and hld.  Counseling provided for all of the vaccine components No orders of the defined types were placed in this encounter.   Caryl Pina, MD [facility]  06/13/2019, 1:26 PM

## 2019-06-14 LAB — CMP14+EGFR
ALT: 53 IU/L — ABNORMAL HIGH (ref 0–44)
AST: 32 IU/L (ref 0–40)
Albumin/Globulin Ratio: 2.1 (ref 1.2–2.2)
Albumin: 4.5 g/dL (ref 3.8–4.8)
Alkaline Phosphatase: 57 IU/L (ref 39–117)
BUN/Creatinine Ratio: 16 (ref 10–24)
BUN: 16 mg/dL (ref 8–27)
Bilirubin Total: 0.7 mg/dL (ref 0.0–1.2)
CO2: 22 mmol/L (ref 20–29)
Calcium: 9.5 mg/dL (ref 8.6–10.2)
Chloride: 109 mmol/L — ABNORMAL HIGH (ref 96–106)
Creatinine, Ser: 0.97 mg/dL (ref 0.76–1.27)
GFR calc Af Amer: 91 mL/min/{1.73_m2} (ref >59)
GFR calc non Af Amer: 79 mL/min/{1.73_m2} (ref >59)
Globulin, Total: 2.1 g/dL (ref 1.5–4.5)
Glucose: 87 mg/dL (ref 65–99)
Potassium: 5.1 mmol/L (ref 3.5–5.2)
Sodium: 146 mmol/L — ABNORMAL HIGH (ref 134–144)
Total Protein: 6.6 g/dL (ref 6.0–8.5)

## 2019-06-14 LAB — CBC WITH DIFFERENTIAL/PLATELET
Basophils Absolute: 0 10*3/uL (ref 0.0–0.2)
Basos: 0 %
EOS (ABSOLUTE): 0.1 10*3/uL (ref 0.0–0.4)
Eos: 2 %
Hematocrit: 45.2 % (ref 37.5–51.0)
Hemoglobin: 15.3 g/dL (ref 13.0–17.7)
Immature Grans (Abs): 0 10*3/uL (ref 0.0–0.1)
Immature Granulocytes: 0 %
Lymphocytes Absolute: 1.7 10*3/uL (ref 0.7–3.1)
Lymphs: 25 %
MCH: 32.8 pg (ref 26.6–33.0)
MCHC: 33.8 g/dL (ref 31.5–35.7)
MCV: 97 fL (ref 79–97)
Monocytes Absolute: 0.4 10*3/uL (ref 0.1–0.9)
Monocytes: 6 %
Neutrophils Absolute: 4.5 10*3/uL (ref 1.4–7.0)
Neutrophils: 67 %
Platelets: 168 10*3/uL (ref 150–450)
RBC: 4.66 x10E6/uL (ref 4.14–5.80)
RDW: 13.2 % (ref 11.6–15.4)
WBC: 6.9 10*3/uL (ref 3.4–10.8)

## 2019-06-14 LAB — LIPID PANEL
Chol/HDL Ratio: 3 ratio (ref 0.0–5.0)
Cholesterol, Total: 108 mg/dL (ref 100–199)
HDL: 36 mg/dL — ABNORMAL LOW (ref >39)
LDL Calculated: 52 mg/dL (ref 0–99)
Triglycerides: 101 mg/dL (ref 0–149)
VLDL Cholesterol Cal: 20 mg/dL (ref 5–40)

## 2019-06-14 LAB — PSA, TOTAL AND FREE
PSA, Free Pct: 20 %
PSA, Free: 1.1 ng/mL
Prostate Specific Ag, Serum: 5.5 ng/mL — ABNORMAL HIGH (ref 0.0–4.0)

## 2019-06-23 ENCOUNTER — Telehealth: Payer: Self-pay

## 2019-06-23 DIAGNOSIS — R911 Solitary pulmonary nodule: Secondary | ICD-10-CM

## 2019-06-23 NOTE — Telephone Encounter (Signed)
Scheduled patient for follow-up with Dr. Burt Knack 9/17. Repeat CT (from cardiac CT to assess pulmonary nodule) ordered for scheduling same day.

## 2019-06-25 ENCOUNTER — Encounter: Payer: Medicare Other | Admitting: *Deleted

## 2019-06-25 NOTE — Telephone Encounter (Signed)
CT has been scheduled 9/17 at 1145 after OV with Dr. Burt Knack.

## 2019-07-14 ENCOUNTER — Other Ambulatory Visit: Payer: Medicare Other

## 2019-07-17 ENCOUNTER — Ambulatory Visit: Payer: Medicare Other | Admitting: Cardiovascular Disease

## 2019-07-17 ENCOUNTER — Encounter: Payer: Self-pay | Admitting: Cardiovascular Disease

## 2019-07-17 ENCOUNTER — Other Ambulatory Visit: Payer: Self-pay

## 2019-07-17 ENCOUNTER — Ambulatory Visit (INDEPENDENT_AMBULATORY_CARE_PROVIDER_SITE_OTHER)
Admission: RE | Admit: 2019-07-17 | Discharge: 2019-07-17 | Disposition: A | Discharge status: Home / Self Care | Payer: Medicare Other | Source: Ambulatory Visit | Attending: Cardiovascular Disease | Admitting: Cardiovascular Disease

## 2019-07-17 ENCOUNTER — Ambulatory Visit (INDEPENDENT_AMBULATORY_CARE_PROVIDER_SITE_OTHER): Payer: Medicare Other | Admitting: Cardiovascular Disease

## 2019-07-17 VITALS — BP 110/80 | HR 63 | Ht 72.0 in | Wt 221.8 lb

## 2019-07-17 DIAGNOSIS — E7800 Pure hypercholesterolemia, unspecified: Secondary | ICD-10-CM

## 2019-07-17 DIAGNOSIS — I10 Essential (primary) hypertension: Secondary | ICD-10-CM | POA: Diagnosis not present

## 2019-07-17 DIAGNOSIS — I2510 Atherosclerotic heart disease of native coronary artery without angina pectoris: Secondary | ICD-10-CM | POA: Diagnosis not present

## 2019-07-17 DIAGNOSIS — R911 Solitary pulmonary nodule: Secondary | ICD-10-CM

## 2019-07-17 NOTE — Progress Notes (Signed)
Cardiology Office Note:    Date:  07/17/2019   ID:  Bradley Wong, DOB [birth year], MRN [id number]  PCP:  Dettinger, Fransisca Kaufmann, MD  Cardiologist:  Sherren Mocha, MD  Electrophysiologist:  None   Referring MD: Dettinger, Fransisca Kaufmann, MD   Chief Complaint  Patient presents with  . Follow-up    CAD, nonobstructive   History of Present Illness:    Bradley Wong is a 73 y.o. male with a hx of nonobstructive coronary artery disease, presenting for follow-up evaluation.  Comorbid conditions include type 2 diabetes, hypertension, and mixed hyperlipidemia.  The patient had a gated coronary CTA in 2019 demonstrating nonobstructive CAD.  There were no high-grade stenoses identified.  He was incidentally noted to have a small pulmonary nodule and he had a follow-up noncontrasted chest CT today.  The result is currently pending.  The patient is doing fine from a cardiac perspective.  He denies chest pain, chest pressure, or shortness of breath.  He denies leg swelling, orthopnea, PND, or heart palpitations.  He has some limitations related to arthritis in his back and legs.  CT today Past Medical History:  Diagnosis Date  . Diabetes mellitus without complication ([place])   . Dyslipidemia   . History of colon polyps   . History of seasonal allergies   . History of thyroid cancer 2000   status post thyroidectomy  . HTN (hypertension)   . Hyperlipidemia   . Thyroid disease     Past Surgical History:  Procedure Laterality Date  . CYSTOSCOPY WITH STENT PLACEMENT Left 09/20/2018   Procedure: CYSTOSCOPY WITH STENT PLACEMENT;  Surgeon: Bjorn Loser, MD;  Location: [facility];  Service: Urology;  Laterality: Left;  . KNEE ARTHROSCOPY AND ARTHROTOMY Right 1979  . PROSTATE BIOPSY  01/2013  . THYROIDECTOMY Bilateral 2000   THYROID CA  . TONSILLECTOMY Bilateral childhood    Current Medications: Current Meds  Medication Sig  . aspirin 81 MG tablet Take 81 mg by mouth daily.  . Cholecalciferol  (VITAMIN D-3) 5000 UNITS TABS Take 5,000 Units by mouth daily.   Marland Kitchen HYDROcodone-acetaminophen (NORCO) 10-325 MG tablet Take 1 tablet by mouth every 6 (six) hours as needed for severe pain.  Marland Kitchen levothyroxine (SYNTHROID) 125 MCG tablet Take 1 tablet (125 mcg total) by mouth daily before breakfast.  . metFORMIN (GLUCOPHAGE-XR) 500 MG 24 hr tablet 1 tablet daily  . olmesartan (BENICAR) 40 MG tablet Take 1 tablet (40 mg total) by mouth daily.  . rosuvastatin (CRESTOR) 40 MG tablet Take 1 tablet (40 mg total) by mouth daily.  . tamsulosin (FLOMAX) 0.4 MG CAPS capsule Take 0.4 mg by mouth daily.     Allergies:   Niaspan [niacin er] and Penicillins   Social History   Socioeconomic History  . Marital status: Married    Spouse name: [name]  . Number of children: 4  . Years of education: 26  . Highest education level: Associate degree: occupational, Hotel manager, or vocational program  Occupational History  . 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 Financial resource strain: Not hard at all  . Food insecurity    Worry: Never true    Inability: Never true  . Transportation needs    Medical: No    Non-medical: No  Tobacco Use  . Smoking status: Never Smoker  . Smokeless tobacco: Never Used  Substance and Sexual Activity  . Alcohol use: Not Currently  .  Drug use: No  . Sexual activity: Not on file  Lifestyle  . Physical activity    Days per week: 3 days    Minutes per session: 60 min  . Stress: Not at all  Relationships  . Social connections    Talks on phone: More than three times a week    Gets together: More than three times a week    Attends religious service: More than 4 times per year    Active member of club or organization: Yes    Attends meetings of clubs or organizations: More than 4 times per year    Relationship status: Married  Other Topics Concern  . Not on file  Social History Narrative  . Not on file      Family History: The patient's family history includes Cancer in his paternal grandfather; Cancer (age of onset: 91) in his father; Congestive Heart Failure in his mother; Coronary artery disease (age of onset: 64) in his mother; Diabetes in his brother, brother, and maternal grandmother; Heart disease in his brother and brother; Hypertension in his mother; Kidney failure in his brother and brother. There is no history of Colon cancer, Rectal cancer, or Stomach cancer.  ROS:   Please see the history of present illness.    All other systems reviewed and are negative.  EKGs/Labs/Other Studies Reviewed:    The following studies were reviewed today: CT coronary: Aorta: Normal size. Trivial calcifications and atherosclerosis. No dissection.  Aortic Valve:  Trileaflet.  No calcifications.  Coronary Arteries:  Normal coronary origin.  Right dominance.  RCA is a medium size dominant artery that gives rise to PDA and PLVB. There is minimal plaque.  Left main is a short artery that bifurcates into LAD and LCX arteries. There is minimal ostial eccentric calcified plaque with associated stenosis 0-25%.  LAD is a large vessel that gives rise to one diagonal artery. There is mild calcified plaque in the proximal segment with associated stenosis 25-50%. Mid, distal LAD and diagonal artery have no significant stenosis.  LCX is a non-dominant artery that gives rise to one large OM1 branch. There is no plaque.  Other findings:  Normal pulmonary vein drainage into the left atrium.  Normal let atrial appendage without a thrombus.  Normal size of the pulmonary artery.  IMPRESSION: 1. Coronary calcium score of 35. This was 61 percentile for age and sex matched control.  2. Normal coronary origin with right dominance.  3. Mild non-obstructive CAD in the left main and proximal LAD.  EKG:  EKG is  ordered today.  The ekg ordered today demonstrates NSR 63 bpm, within normal limits   Recent Labs: 06/13/2019: ALT 53; BUN 16; Creatinine, Ser 0.97; Hemoglobin 15.3; Platelets 168; Potassium 5.1; Sodium 146  Recent Lipid Panel    Component Value Date/Time   CHOL 108 06/13/2019 1353   CHOL 94 04/30/2013 0810   TRIG 101 06/13/2019 1353   TRIG 62 04/18/2017 1050   TRIG 53 04/30/2013 0810   HDL 36 (L) 06/13/2019 1353   HDL 44 04/18/2017 1050   HDL 36 (L) 04/30/2013 0810   CHOLHDL 3.0 06/13/2019 1353   LDLCALC 52 06/13/2019 1353   LDLCALC 39 05/12/2014 0841   LDLCALC 47 04/30/2013 0810    Physical Exam:    VS:  BP 110/80   Pulse 63   Ht 6' (1.829 m)   Wt 221 lb 12.8 oz (100.6 kg)   SpO2 97%   BMI 30.08 kg/m  Wt Readings from Last 3 Encounters:  07/17/19 221 lb 12.8 oz (100.6 kg)  06/13/19 218 lb 3.2 oz (99 kg)  04/07/19 216 lb (98 kg)     GEN:  Well nourished, well developed in no acute distress HEENT: Normal NECK: No JVD; No carotid bruits LYMPHATICS: No lymphadenopathy CARDIAC: RRR, no murmurs, rubs, gallops RESPIRATORY:  Clear to auscultation without rales, wheezing or rhonchi  ABDOMEN: Soft, non-tender, non-distended MUSCULOSKELETAL:  No edema; No deformity  SKIN: Warm and dry NEUROLOGIC:  Alert and oriented x 3 PSYCHIATRIC:  Normal affect   ASSESSMENT:    1. Pure hypercholesterolemia   2. Coronary artery disease involving native coronary artery of native heart without angina pectoris   3. Essential hypertension    PLAN:    In order of problems listed above:  1. Lipids are excellent with cholesterol 108, LDL 52, and HDL 36. Continue same Rx. Lifestyle modification reviewed. 2. CTA reviewed. Mild nonobstructive CAD present in patient with no angina. Continue ARB in setting of diabetes. Continue low dose ASA and a statin drug. 3. BP well controlled on olmesartan. 4. Pulmonary nodule: follow-up CT done today. Will review when completed.   Medication Adjustments/Labs and Tests Ordered: Current medicines are reviewed at length with the  patient today.  Concerns regarding medicines are outlined above.  Orders Placed This Encounter  Procedures  . EKG 12-Lead   No orders of the defined types were placed in this encounter.   Patient Instructions  Medication Instructions:  Your provider recommends that you continue on your current medications as directed. Please refer to the Current Medication list given to you today.    Labwork: None  Testing/Procedures: None  Follow-Up: Your provider wants you to follow-up in: 1 year with Dr. Burt Knack. You will receive a reminder letter in the mail two months in advance. If you don't receive a letter, please call our office to schedule the follow-up appointment.    Any Other Special Instructions Will Be Listed Below (If Applicable).     If you need a refill on your cardiac medications before your next appointment, please call your pharmacy.      Signed, Sherren Mocha, MD  07/17/2019 11:21 AM    [place]

## 2019-07-17 NOTE — Patient Instructions (Signed)

## 2019-08-08 DIAGNOSIS — C73 Malignant neoplasm of thyroid gland: Secondary | ICD-10-CM | POA: Diagnosis not present

## 2019-08-08 DIAGNOSIS — Z23 Encounter for immunization: Secondary | ICD-10-CM | POA: Diagnosis not present

## 2019-08-08 DIAGNOSIS — E890 Postprocedural hypothyroidism: Secondary | ICD-10-CM | POA: Diagnosis not present

## 2019-09-18 ENCOUNTER — Telehealth: Payer: Self-pay | Admitting: Family Medicine

## 2019-09-18 NOTE — Chronic Care Management (AMB) (Signed)
Chronic Care Management   Note  09/18/2019 Name: Bradley Wong MRN: [id number] DOB: February 15, 1946  [name] is a 73 y.o. year old male who is a primary care patient of Dettinger, Fransisca Kaufmann, MD. I reached out to Jamie Kato by phone today in response to a referral sent by [name]'s health plan.     Mr. Wong was given information about Chronic Care Management services today including:  1. CCM service includes personalized support from designated clinical staff supervised by his physician, including individualized plan of care and coordination with other care providers 2. 24/7 contact phone numbers for assistance for urgent and routine care needs. 3. Service will only be billed when office clinical staff spend 20 minutes or more in a month to coordinate care. 4. Only one practitioner may furnish and bill the service in a calendar month. 5. The patient may stop CCM services at any time (effective at the end of the month) by phone call to the office staff. 6. The patient will be responsible for cost sharing (co-pay) of up to 20% of the service fee (after annual deductible is met).  Patient agreed to services and verbal consent obtained.   Follow up plan: Telephone appointment with CCM team member scheduled for: 10/16/2019  Bernice, [facility]  [place], [place] [address] Direct Dial: [email or web address]  Website: [email or web address]

## 2019-10-16 ENCOUNTER — Telehealth: Payer: Medicare Other

## 2019-10-27 ENCOUNTER — Ambulatory Visit: Payer: Medicare Other | Attending: Internal Medicine

## 2019-10-27 ENCOUNTER — Other Ambulatory Visit: Payer: Self-pay

## 2019-10-27 DIAGNOSIS — Z20822 Contact with and (suspected) exposure to covid-19: Secondary | ICD-10-CM

## 2019-10-29 ENCOUNTER — Telehealth: Payer: Self-pay | Admitting: Unknown Physician Specialty

## 2019-10-29 ENCOUNTER — Other Ambulatory Visit: Payer: Self-pay | Admitting: Unknown Physician Specialty

## 2019-10-29 ENCOUNTER — Ambulatory Visit (INDEPENDENT_AMBULATORY_CARE_PROVIDER_SITE_OTHER): Payer: Medicare Other | Admitting: *Deleted

## 2019-10-29 DIAGNOSIS — I10 Essential (primary) hypertension: Secondary | ICD-10-CM | POA: Diagnosis not present

## 2019-10-29 DIAGNOSIS — U071 COVID-19: Secondary | ICD-10-CM

## 2019-10-29 DIAGNOSIS — E118 Type 2 diabetes mellitus with unspecified complications: Secondary | ICD-10-CM

## 2019-10-29 DIAGNOSIS — E890 Postprocedural hypothyroidism: Secondary | ICD-10-CM | POA: Diagnosis not present

## 2019-10-29 LAB — NOVEL CORONAVIRUS, NAA: SARS-CoV-2, NAA: DETECTED — AB

## 2019-10-29 NOTE — Patient Instructions (Signed)
Chronic Care Management   10/29/2019  Bradley Wong,  If you have Medicare and live with two or more chronic conditions like arthritis, diabetes, depression, or high blood pressure, Chronic Care Management services can help you connect the dots so you can spend more time doing what you love.   Services May Include: . At least 20 minutes a month of chronic care management services with a Nurse Care Manager and/or Licensed Clinical Social Worker . Personalized assistance from a dedicated health care professional who will work with you to create your care plan . Coordination of care between your pharmacy, specialists, testing centers, and more . Phone check-ins between visits to keep you on track . 24/7 emergency access to a health care professional . Expert assistance with setting and meeting your health goals  During your initial telephone contact by [facility] staff, you were given the following information on Medicare guidelines regarding Chronic Care Management Services.  If you have any questions, please let me know.  1. CCM service include personalized support from designated clinical staff supervised by your physician, including individualized plans of care and coordination with other care providers 2. 24/7 contact phone numbers for assistance for urgent and routine care needs Bradley Wong [phone], 24-Hr Nurse Line [phone], [facility] [phone]) 3. Service will only be billed when office clinical staff spend 20 minutes or more in a month to coordinate care. 4. Only one practitioner may furnish and bill the service in a calendar month. 5. The patient will be responsible for cost sharing (co-pay) of up to 20% of the service fee (after annual deductible is met). Most Medicare Advantage Plans cover Chronic Care Management Services at 100%. Please ask if you have any questions about your coverage.  6. You have provided verbal consent for these services. You may stop CCM services at any time  (effective at the end of the    month) by phone call to the office staff.  Thank you for talking with me about the Chronic Care Management program at [facility] (through [facility] As your Nurse Care Manager, I look forward to working with you to meet your healthcare management goals.   Listed below is the care plan that we created today. We will work together to adjust this care plan to meet your needs as they change.   Goals Addressed            This Visit's Progress     Patient Stated   . Recover From Covid (pt-stated)       Current Barriers:  . 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 Over the next 14 days, patient will continue to quarantine due to Covid-19 infection  Interventions:  . Discussed positive Covid-19 test results that he received today. Wife is positive as well.  . Discussed HPI o Overall feels like the flu (bodyaches, cough, etc) o Initially had a fever but that has improved . Discussed quarantining and treatment . Objectively the patient did not seem to be in any acute distress and did not cough or have any labored breathing during our phone conversation . Provided with RN CCM contact information and encouraged to reach out as needed  Patient Self Care Activities:  . Performs ADL's independently . Performs IADL's independently  Initial goal documentation       Other   . Chronic Disease Management Needs       Current Barriers:  . Chronic Disease Management support, education, and care coordination needs related to HTN, thyroid cancer, post-surgical hypothyroidism, DM, osteoarthritis, dyslipidemia  Clinical Goal(s) related to HTN, thyroid cancer,  post-surgical hypothyroidism, DM, osteoarthritis, dyslipidemia:  Over the next 60 days, patient will:  . Work with the care management team to address educational, disease management, and care coordination needs  . Call provider office for new or worsened signs and symptoms  . Call care management team with questions or concerns . Verbalize basic understanding of patient centered plan of care established today  Interventions related to HTN, thyroid cancer, post-surgical hypothyroidism, DM, osteoarthritis, dyslipidemia:  . Evaluation of current treatment plans and patient's adherence to plan as established by provider . Assessed patient understanding of disease states . Assessed patient's education and care coordination needs . Provided disease specific education to patient  . Collaborated with appropriate clinical care team members regarding patient needs  Patient Self Care Activities related to HTN, thyroid cancer, post-surgical hypothyroidism, DM, osteoarthritis, dyslipidemia:  . Patient is unable to independently self-manage chronic health conditions  Initial goal documentation         Chong Sicilian, BSN, RN-BC [place] / [place]: [phone]   The patient verbalized understanding of instructions provided today and declined a print copy of patient instruction materials. CCM information and RN business card to be mailed to patient.

## 2019-10-29 NOTE — Chronic Care Management (AMB) (Signed)
Chronic Care Management   Initial Visit Note  10/29/2019 Name: Bradley Wong MRN: [id number] DOB: 1946/10/03  Referred by: Dettinger, Fransisca Kaufmann, MD Reason for referral : Chronic Care Management (RN Initial Outreach)   Bradley Wong is a 73 y.o. year old male who is a primary care patient of Dettinger, Fransisca Kaufmann, MD. The CCM team was consulted for assistance with chronic disease management and care coordination needs related to HTN, thyroid cancer, post-surgical hypothyroidism, DM, osteoarthritis, dyslipidemia.  Review of patient status, including review of consultants reports, relevant laboratory and other test results, and collaboration with appropriate care team members and the patient's provider was performed as part of comprehensive patient evaluation and provision of chronic care management services.    SDOH (Social Determinants of Health) screening performed today: None. See Care Plan for related entries.   Subjective: I spoke with [name] by telephone today. Overall he feels that he is managing his health well but he would like to stay in contact with the CCM team in case a need arises. He just found out today that he and his wife are positive for Covid-19. He initially had a fever and has some flu like symptoms but overall is feeling better than he was previously.   Objective: Outpatient Encounter Medications as of 10/29/2019  Medication Sig Note  . aspirin 81 MG tablet Take 81 mg by mouth daily.   . Cholecalciferol (VITAMIN D-3) 5000 UNITS TABS Take 5,000 Units by mouth daily.    Marland Kitchen HYDROcodone-acetaminophen (NORCO) 10-325 MG tablet Take 1 tablet by mouth every 6 (six) hours as needed for severe pain.   Marland Kitchen levothyroxine (SYNTHROID) 125 MCG tablet Take 1 tablet (125 mcg total) by mouth daily before breakfast. 10/29/2019: Namebrand Synthroid Only  . metFORMIN (GLUCOPHAGE-XR) 500 MG 24 hr tablet 1 tablet daily   . olmesartan (BENICAR) 40 MG tablet Take 1 tablet (40 mg total)  by mouth daily.   . rosuvastatin (CRESTOR) 40 MG tablet Take 1 tablet (40 mg total) by mouth daily.   . tamsulosin (FLOMAX) 0.4 MG CAPS capsule Take 0.4 mg by mouth daily.   . [DISCONTINUED] levothyroxine (SYNTHROID) 125 MCG tablet Take 125 mcg by mouth daily.    No facility-administered encounter medications on file as of 10/29/2019.    BP Readings from Last 3 Encounters:  07/17/19 110/80  06/13/19 122/76  04/07/19 116/76   Lab Results  Component Value Date   HGBA1C 6.5 06/13/2019   HGBA1C 6.3 12/04/2018   HGBA1C 6.3 (H) 06/15/2018   Lab Results  Component Value Date   LDLCALC 52 06/13/2019   CREATININE 0.97 06/13/2019   Lab Results  Component Value Date   CHOL 108 06/13/2019   HDL 36 (L) 06/13/2019   LDLCALC 52 06/13/2019   TRIG 101 06/13/2019   CHOLHDL 3.0 06/13/2019   Covid-19 Nucleic Acid Test Results Lab Results  Component Value Date   SARSCOV2NAA Detected (A) 10/27/2019    RN Assessment & Care Plan   . Recover From Covid        Current Barriers:  . Chronic Disease Management support and education needs related to Covid-19 infection in patient with HTN, DM, post-surgical hypothyroidism due to thyroid cancer  Nurse Case Manager Clinical Goal(s):  Marland Kitchen Over the next 14 days, patient will not experience any worsening Covid-19 infection symptoms . Over the next 14 days, patient will continue to quarantine due to Covid-19 infection  Interventions:  . Discussed positive Covid-19 test results that he received today.  Wife is positive as well.  . Discussed HPI o Overall feels like the flu (bodyaches, cough, etc) o Initially had a fever but that has improved . Discussed quarantining and treatment . Objectively the patient did not seem to be in any acute distress and did not cough or have any labored breathing during our phone conversation . Provided with RN CCM contact information and encouraged to reach out as needed  Patient Self Care Activities:  . Performs ADL's  independently . Performs IADL's independently  Initial goal documentation       . Chronic Disease Management Needs       Current Barriers:  . Chronic Disease Management support, education, and care coordination needs related to HTN, thyroid cancer, post-surgical hypothyroidism, DM, osteoarthritis, dyslipidemia  Clinical Goal(s) related to HTN, thyroid cancer, post-surgical hypothyroidism, DM, osteoarthritis, dyslipidemia:  Over the next 60 days, patient will:  . Work with the care management team to address educational, disease management, and care coordination needs  . Call provider office for new or worsened signs and symptoms  . Call care management team with questions or concerns . Verbalize basic understanding of patient centered plan of care established today  Interventions related to HTN, thyroid cancer, post-surgical hypothyroidism, DM, osteoarthritis, dyslipidemia:  . Evaluation of current treatment plans and patient's adherence to plan as established by provider . Assessed patient understanding of disease states . Assessed patient's education and care coordination needs . Provided disease specific education to patient  . Collaborated with appropriate clinical care team members regarding patient needs  Patient Self Care Activities related to HTN, thyroid cancer, post-surgical hypothyroidism, DM, osteoarthritis, dyslipidemia:  . Patient is unable to independently self-manage chronic health conditions  Initial goal documentation         Follow-up Plan:   The care management team will reach out to the patient again over the next 30 days.   Chong Sicilian, BSN, RN-BC Embedded Chronic Care Manager [facility] / [facility] Direct Dial: [phone]

## 2019-10-29 NOTE — Telephone Encounter (Signed)
  I connected by phone with Bradley Wong on 10/29/2019 at 10:54 AM to discuss the potential use of an new treatment for mild to moderate COVID-19 viral infection in non-hospitalized patients.  This patient is a 73 y.o. male that meets the FDA criteria for Emergency Use Authorization of bamlanivimab or casirivimab\imdevimab.  Has a (+) direct SARS-CoV-2 viral test result  Has mild or moderate COVID-19   Is ? 71 years of age and weighs ? 40 kg  Is NOT hospitalized due to COVID-19  Is NOT requiring oxygen therapy or requiring an increase in baseline oxygen flow rate due to COVID-19  Is within 10 days of symptom onset  Has at least one of the high risk factor(s) for progression to severe COVID-19 and/or hospitalization as defined in EUA.  Specific high risk criteria : >/= 71 yo with multiple co-morbid conditions.     I have spoken and communicated the following to the patient or parent/caregiver:  1. FDA has authorized the emergency use of bamlanivimab and casirivimab\imdevimab for the treatment of mild to moderate COVID-19 in adults and pediatric patients with positive results of direct SARS-CoV-2 viral testing who are 68 years of age and older weighing at least 40 kg, and who are at high risk for progressing to severe COVID-19 and/or hospitalization.  2. The significant known and potential risks and benefits of bamlanivimab and casirivimab\imdevimab, and the extent to which such potential risks and benefits are unknown.  3. Information on available alternative treatments and the risks and benefits of those alternatives, including clinical trials.  4. Patients treated with bamlanivimab and casirivimab\imdevimab should continue to self-isolate and use infection control measures (e.g., wear mask, isolate, social distance, avoid sharing personal items, clean and disinfect "high touch" surfaces, and frequent handwashing) according to CDC guidelines.   5. The patient or parent/caregiver  has the option to accept or refuse bamlanivimab or casirivimab\imdevimab .  After reviewing this information with the patient, The patient agreed to proceed with receiving the bamlanimivab infusion and will be provided a copy of the Fact sheet prior to receiving the infusion.Kathrine Haddock 10/29/2019 10:54 AM  Sx onset 12/26

## 2019-10-30 ENCOUNTER — Ambulatory Visit (HOSPITAL_COMMUNITY)
Admission: RE | Admit: 2019-10-30 | Discharge: 2019-10-30 | Disposition: A | Discharge status: Home / Self Care | Payer: Medicare Other | Source: Ambulatory Visit | Attending: Pulmonary Disease | Admitting: Pulmonary Disease

## 2019-10-30 DIAGNOSIS — Z23 Encounter for immunization: Secondary | ICD-10-CM | POA: Diagnosis not present

## 2019-10-30 DIAGNOSIS — U071 COVID-19: Secondary | ICD-10-CM | POA: Diagnosis not present

## 2019-10-30 MED ORDER — EPINEPHRINE 0.3 MG/0.3ML IJ SOAJ: 0.3000 mg | Freq: Once | INTRAMUSCULAR | Status: DC | PRN

## 2019-10-30 MED ORDER — FAMOTIDINE IN NACL 20-0.9 MG/50ML-% IV SOLN: 20.0000 mg | Freq: Once | INTRAVENOUS | Status: DC | PRN

## 2019-10-30 MED ORDER — ALBUTEROL SULFATE HFA 108 (90 BASE) MCG/ACT IN AERS: 2.0000 | INHALATION_SPRAY | Freq: Once | RESPIRATORY_TRACT | Status: DC | PRN

## 2019-10-30 MED ORDER — SODIUM CHLORIDE 0.9 % IV SOLN
INTRAVENOUS | Status: DC | PRN
  Administered 2019-10-30: 250 mL via INTRAVENOUS

## 2019-10-30 MED ORDER — METHYLPREDNISOLONE SODIUM SUCC 125 MG IJ SOLR: 125.0000 mg | Freq: Once | INTRAMUSCULAR | Status: DC | PRN

## 2019-10-30 MED ORDER — DIPHENHYDRAMINE HCL 50 MG/ML IJ SOLN: 50.0000 mg | Freq: Once | INTRAMUSCULAR | Status: DC | PRN

## 2019-10-30 MED ORDER — SODIUM CHLORIDE 0.9 % IV SOLN
700.0000 mg | Freq: Once | INTRAVENOUS | Status: AC
  Administered 2019-10-30: 700 mg via INTRAVENOUS
  Filled 2019-10-30: qty 20

## 2019-10-30 NOTE — Discharge Instructions (Signed)

## 2019-10-30 NOTE — Progress Notes (Signed)
  Diagnosis: COVID-19  Physician: Dr. Vonna Kotyk Dettinger  Procedure: [facility] Infusion Clinic Med: bamlanivimab infusion - Provided patient with bamlanimivab fact sheet for patients, parents and caregivers prior to infusion.  Complications: No immediate complications noted.  Discharge: Discharged home   Annalisia Ingber L 10/30/2019

## 2019-11-27 ENCOUNTER — Telehealth: Payer: Medicare Other

## 2020-01-15 ENCOUNTER — Telehealth: Payer: Medicare Other

## 2020-01-20 ENCOUNTER — Telehealth: Payer: Medicare Other | Admitting: *Deleted

## 2020-03-15 DIAGNOSIS — Z23 Encounter for immunization: Secondary | ICD-10-CM | POA: Diagnosis not present

## 2020-04-07 ENCOUNTER — Ambulatory Visit (INDEPENDENT_AMBULATORY_CARE_PROVIDER_SITE_OTHER): Payer: Medicare Other

## 2020-04-07 DIAGNOSIS — Z0000 Encounter for general adult medical examination without abnormal findings: Secondary | ICD-10-CM | POA: Diagnosis not present

## 2020-04-07 NOTE — Patient Instructions (Signed)
  [facility] Maintenance Summary and Written Plan of Care  Mr. Bradley Wong ,  Thank you for allowing me to perform your Medicare Annual Wellness Visit and for your ongoing commitment to your health.   Health Maintenance & Immunization History Health Maintenance  Topic Date Due  . Hepatitis C Screening  Never done  . COVID-19 Vaccine (1) Never done  . INFLUENZA VACCINE  05/30/2020  . COLONOSCOPY  04/26/2023  . TETANUS/TDAP  06/05/2028  . PNA vac Low Risk Adult  Completed   Immunization History  Administered Date(s) Administered  . Influenza Split 08/02/2013  . Influenza, High Dose Seasonal PF 07/26/2016, 11/08/2018  . Influenza,inj,Quad PF,6+ Mos 08/19/2014  . Pneumococcal Conjugate-13 12/31/2013  . Pneumococcal Polysaccharide-23 11/29/2016  . Td 06/05/2018  . Tdap 12/02/2006    These are the patient goals that we discussed:  Schedule annual eye exam.   This is a list of Health Maintenance Items that are overdue or due now: Health Maintenance Due  Topic Date Due  . Hepatitis C Screening  Never done  . COVID-19 Vaccine (1) Never done     Orders/Referrals Placed Today: No orders of the defined types were placed in this encounter.  (Contact our referral department at [phone] if you have not spoken with someone about your referral appointment within the next 5 days)    Follow-up Plan  Scheduled with Dr. Warrick Parisian 05/19/2020 at 9:55am

## 2020-04-07 NOTE — Progress Notes (Signed)
MEDICARE ANNUAL WELLNESS VISIT  04/07/2020  Telephone Visit Disclaimer This Medicare AWV was conducted by telephone due to national recommendations for restrictions regarding the COVID-19 Pandemic (e.g. social distancing).  I verified, using two identifiers, that I am speaking with Bradley Wong or their authorized healthcare agent. I discussed the limitations, risks, security, and privacy concerns of performing an evaluation and management service by telephone and the potential availability of an in-person appointment in the future. The patient expressed understanding and agreed to proceed.   Subjective:  Bradley Wong is a 74 y.o. male patient of Dettinger, Fransisca Kaufmann, MD who had a Medicare Annual Wellness Visit today via telephone. Bradley Wong is Working full time and lives with their spouse. he has four children. he reports that he is socially active and does interact with friends/family regularly. he is moderately physically active.  Patient Care Team: Dettinger, Fransisca Kaufmann, MD as PCP - General (Family Medicine) Sherren Mocha, MD as PCP - Cardiology (Cardiology) Kristian Covey, MD (Internal Medicine) Irine Seal, MD (Urology) Melina Schools, OD (Optometry) Bjorn Loser, MD as Consulting Physician (Urology) Ilean China, RN as Registered Nurse  Advanced Directives 04/07/2020 04/07/2019 09/30/2018 09/20/2018 09/16/2018 01/24/2018  Does Patient Have a Medical Advance Directive? Yes No Yes Yes No No  Type of Advance Directive Living will;Healthcare Power of [place];Living will [place];Living will - -  Does patient want to make changes to medical advance directive? - - No - Patient declined - - -  Copy of [place] in Chart? - - No - copy requested No - copy requested - -  Would patient like information on creating a medical advance directive? - Yes (MAU/Ambulatory/Procedural Areas - Information given) - - - Yes  (MAU/Ambulatory/Procedural Areas - Information given)    Hospital Utilization Over the Past 12 Months: # of hospitalizations or ER visits: 1 # of surgeries: 0  Review of Systems    Patient reports that his overall health is unchanged compared to last year.   Patient Reported Readings (BP, Pulse, CBG, Weight, etc) none  Pain Assessment Pain : 0-10 Pain Score: 4  Pain Type: Chronic pain Pain Location: Flank Pain Orientation: Left Pain Descriptors / Indicators: Discomfort Pain Onset: In the past 7 days Pain Frequency: Intermittent Pain Relieving Factors: Hydrocodone prn  Pain Relieving Factors: Hydrocodone prn  Current Medications & Allergies (verified) Allergies as of 04/07/2020      Reactions   Niaspan [niacin Er] Other (See Comments)   flushing   Penicillins Rash   Has patient had a PCN reaction causing immediate rash, facial/tongue/throat swelling, SOB or lightheadedness with hypotension: No Has patient had a PCN reaction causing severe rash involving mucus membranes or skin necrosis: No Has patient had a PCN reaction that required hospitalization: Unknown Has patient had a PCN reaction occurring within the last 10 years: No If all of the above answers are "NO", then may proceed with Cephalosporin use.      Medication List       Accurate as of April 07, 2020 10:00 AM. If you have any questions, ask your nurse or doctor.        aspirin 81 MG tablet Take 81 mg by mouth daily.   HYDROcodone-acetaminophen 10-325 MG tablet Commonly known as: [place] Take 1 tablet by mouth every 6 (six) hours as needed for severe pain.   levothyroxine 125 MCG tablet Commonly known as: SYNTHROID Take 1 tablet (125  mcg total) by mouth daily before breakfast.   metFORMIN 500 MG 24 hr tablet Commonly known as: GLUCOPHAGE-XR 1 tablet daily   olmesartan 40 MG tablet Commonly known as: BENICAR Take 1 tablet (40 mg total) by mouth daily.   rosuvastatin 40 MG tablet Commonly known as:  CRESTOR Take 1 tablet (40 mg total) by mouth daily.   tamsulosin 0.4 MG Caps capsule Commonly known as: FLOMAX Take 0.4 mg by mouth daily.   Vitamin D-3 125 MCG (5000 UT) Tabs Take 5,000 Units by mouth daily.       History (reviewed): Past Medical History:  Diagnosis Date  . Diabetes mellitus without complication ([place])   . Dyslipidemia   . History of colon polyps   . History of seasonal allergies   . History of thyroid cancer 2000   status post thyroidectomy  . HTN (hypertension)   . Hyperlipidemia   . Thyroid disease    Past Surgical History:  Procedure Laterality Date  . CYSTOSCOPY WITH STENT PLACEMENT Left 09/20/2018   Procedure: CYSTOSCOPY WITH STENT PLACEMENT;  Surgeon: Bjorn Loser, MD;  Location: [facility];  Service: Urology;  Laterality: Left;  . KNEE ARTHROSCOPY AND ARTHROTOMY Right 1979  . PROSTATE BIOPSY  01/2013  . THYROIDECTOMY Bilateral 2000   THYROID CA  . TONSILLECTOMY Bilateral childhood   Family History  Problem Relation Age of Onset  . Coronary artery disease Mother 63  . Hypertension Mother   . Congestive Heart Failure Mother   . Cancer Father 53       throat and bone cancer  . Diabetes Brother        deceased  . Heart disease Brother   . Kidney failure Brother   . Diabetes Maternal Grandmother   . Cancer Paternal Grandfather        leukemia  . Diabetes Brother   . Heart disease Brother   . Kidney failure Brother   . Colon cancer Neg Hx   . Rectal cancer Neg Hx   . Stomach cancer Neg Hx    Social History   Socioeconomic History  . Marital status: Married    Spouse name: [name]  . Number of children: 4  . Years of education: 32  . Highest education level: Associate degree: occupational, Hotel manager, or vocational program  Occupational History  . 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 Smoking status: Never Smoker  . Smokeless tobacco: Never Used  Substance and Sexual Activity   . Alcohol use: Not Currently  . Drug use: No  . Sexual activity: Not on file  Other Topics Concern  . Not on file  Social History Narrative  . Not on file   Social Determinants of Health   Financial Resource Strain:   . Difficulty of Paying Living Expenses:   Food Insecurity:   . Worried About Charity fundraiser in the Last Year:   . Arboriculturist in the Last Year:   Transportation Needs:   . Film/video editor (Medical):   Marland Kitchen Lack of Transportation (Non-Medical):   Physical Activity:   . Days of Exercise per Week:   . Minutes of Exercise per Session:   Stress:   . Feeling of Stress :   Social Connections:   . Frequency of Communication with Friends and Family:   . Frequency of Social Gatherings with Friends and Family:   . Attends Religious Services:   .  Active Member of Clubs or Organizations:   . Attends Archivist Meetings:   Marland Kitchen Marital Status:     Activities of Daily Living In your present state of health, do you have any difficulty performing the following activities: 04/07/2020  Hearing? N  Vision? N  Difficulty concentrating or making decisions? N  Walking or climbing stairs? N  Dressing or bathing? N  Doing errands, shopping? N  Preparing Food and eating ? N  Using the Toilet? N  In the past six months, have you accidently leaked urine? N  Do you have problems with loss of bowel control? N  Managing your Medications? N  Managing your Finances? N  Housekeeping or managing your Housekeeping? N  Some recent data might be hidden    Patient Education/ Literacy How often do you need to have someone help you when you read instructions, pamphlets, or other written materials from your doctor or pharmacy?: 1 - Never What is the last grade level you completed in school?: 2 years of college  Exercise Current Exercise Habits: The patient has a physically strenuous job, but has no regular exercise apart from work., Exercise limited by: orthopedic  condition(s)  Diet Patient reports consuming 2 meals a day and 1 snack(s) a day Patient reports that his primary diet is: Regular Patient reports that she does have regular access to food.   Depression Screen PHQ 2/9 Scores 04/07/2020 06/13/2019 04/07/2019 12/04/2018 06/05/2018 01/24/2018 12/05/2017  PHQ - 2 Score 0 0 0 0 0 0 0     Fall Risk Fall Risk  04/07/2020 06/13/2019 04/07/2019 12/04/2018 06/05/2018  Falls in the past year? 0 0 0 0 No     Objective:  CORRION STIREWALT seemed alert and oriented and he participated appropriately during our telephone visit.  Blood Pressure Weight BMI  BP Readings from Last 3 Encounters:  10/30/19 113/70  07/17/19 110/80  06/13/19 122/76   Wt Readings from Last 3 Encounters:  07/17/19 221 lb 12.8 oz (100.6 kg)  06/13/19 218 lb 3.2 oz (99 kg)  04/07/19 216 lb (98 kg)   BMI Readings from Last 1 Encounters:  07/17/19 30.08 kg/m    *Unable to obtain current vital signs, weight, and BMI due to telephone visit type  Hearing/Vision  . Bradley Wong did not seem to have difficulty with hearing/understanding during the telephone conversation . Reports that he has not had a formal eye exam by an eye care professional within the past year . Reports that he has not had a formal hearing evaluation within the past year *Unable to fully assess hearing and vision during telephone visit type  Cognitive Function: 6CIT Screen 04/07/2020 04/07/2019  What Year? 0 points 0 points  What month? 0 points 0 points  What time? 0 points 0 points  Count back from 20 0 points 0 points  Months in reverse 0 points 0 points  Repeat phrase 0 points 0 points  Total Score 0 0   (Normal:0-7, Significant for Dysfunction: >8)  Normal Cognitive Function Screening: Yes   Immunization & Health Maintenance Record Immunization History  Administered Date(s) Administered  . Influenza Split 08/02/2013  . Influenza, High Dose Seasonal PF 07/26/2016, 11/08/2018  . Influenza,inj,Quad PF,6+ Mos  08/19/2014  . Pneumococcal Conjugate-13 12/31/2013  . Pneumococcal Polysaccharide-23 11/29/2016  . Td 06/05/2018  . Tdap 12/02/2006    Health Maintenance  Topic Date Due  . Hepatitis C Screening  Never done  . COVID-19 Vaccine (1) Never done  . INFLUENZA  VACCINE  05/30/2020  . COLONOSCOPY  04/26/2023  . TETANUS/TDAP  06/05/2028  . PNA vac Low Risk Adult  Completed       Assessment  This is a routine wellness examination for [employer].  Health Maintenance: Due or Overdue Health Maintenance Due  Topic Date Due  . Hepatitis C Screening  Never done  . COVID-19 Vaccine (1) Never done    Bradley Wong does not need a referral for Community Assistance: Care Management:   no Social Work:    no Prescription Assistance:  no Nutrition/Diabetes Education:  no   Plan:  Personalized Goals Goals Addressed            This Visit's Progress     Patient Stated   . Recover From Covid (pt-stated)   On track    Current Barriers:  . Chronic Disease Management support and education needs related to Covid-19 infection in patient with HTN, DM, post-surgical hypothyroidism due to thyroid cancer  Nurse Case Manager Clinical Goal(s):  Marland Kitchen Over the next 14 days, patient will not experience any worsening Covid-19 infection symptoms . Over the next 14 days, patient will continue to quarantine due to Covid-19 infection  Interventions:  . Discussed positive Covid-19 test results that he received today. Wife is positive as well.  . Discussed HPI o Overall feels like the flu (bodyaches, cough, etc) o Initially had a fever but that has improved . Discussed quarantining and treatment . Objectively the patient did not seem to be in any acute distress and did not cough or have any labored breathing during our phone conversation . Provided with RN CCM contact information and encouraged to reach out as needed  Patient Self Care Activities:  . Performs ADL's independently . Performs  IADL's independently  Initial goal documentation       Other   . Chronic Disease Management Needs   On track    Current Barriers:  . Chronic Disease Management support, education, and care coordination needs related to HTN, thyroid cancer, post-surgical hypothyroidism, DM, osteoarthritis, dyslipidemia  Clinical Goal(s) related to HTN, thyroid cancer, post-surgical hypothyroidism, DM, osteoarthritis, dyslipidemia:  Over the next 60 days, patient will:  . Work with the care management team to address educational, disease management, and care coordination needs  . Call provider office for new or worsened signs and symptoms  . Call care management team with questions or concerns . Verbalize basic understanding of patient centered plan of care established today  Interventions related to HTN, thyroid cancer, post-surgical hypothyroidism, DM, osteoarthritis, dyslipidemia:  . Evaluation of current treatment plans and patient's adherence to plan as established by provider . Assessed patient understanding of disease states . Assessed patient's education and care coordination needs . Provided disease specific education to patient  . Collaborated with appropriate clinical care team members regarding patient needs  Patient Self Care Activities related to HTN, thyroid cancer, post-surgical hypothyroidism, DM, osteoarthritis, dyslipidemia:  . Patient is unable to independently self-manage chronic health conditions  Initial goal documentation      . Prevent falls   On track    Stay active "finish pool house"    . Prevent falls   On track    Work to complete shop safely while preventing accidents and falls.      Personalized Health Maintenance & Screening Recommendations    Lung Cancer Screening Recommended: no (Low Dose CT Chest recommended if Age 47-80 years, 30 pack-year currently smoking OR have quit w/in past 15 years)  Hepatitis C Screening recommended: no HIV Screening  recommended: no  Advanced Directives: Written information was not prepared per patient's request.  Referrals & Orders No orders of the defined types were placed in this encounter.   Follow-up Plan . Follow-up with Dettinger, Fransisca Kaufmann, MD as planned . Schedule 05/19/2020    I have personally reviewed and noted the following in the patient's chart:   . Medical and social history . Use of alcohol, tobacco or illicit drugs  . Current medications and supplements . Functional ability and status . Nutritional status . Physical activity . Advanced directives . List of other physicians . Hospitalizations, surgeries, and ER visits in previous 12 months . Vitals . Screenings to include cognitive, depression, and falls . Referrals and appointments  In addition, I have reviewed and discussed with Bradley Wong certain preventive protocols, quality metrics, and best practice recommendations. A written personalized care plan for preventive services as well as general preventive health recommendations is available and can be mailed to the patient at his request.      Maud Deed [facility]  04/04/8158

## 2020-04-12 DIAGNOSIS — Z23 Encounter for immunization: Secondary | ICD-10-CM | POA: Diagnosis not present

## 2020-04-21 DIAGNOSIS — R972 Elevated prostate specific antigen [PSA]: Secondary | ICD-10-CM | POA: Diagnosis not present

## 2020-05-19 ENCOUNTER — Encounter: Payer: Self-pay | Admitting: Family Medicine

## 2020-05-19 ENCOUNTER — Ambulatory Visit (INDEPENDENT_AMBULATORY_CARE_PROVIDER_SITE_OTHER): Payer: Medicare Other | Admitting: Family Medicine

## 2020-05-19 ENCOUNTER — Other Ambulatory Visit: Payer: Self-pay

## 2020-05-19 VITALS — BP 103/66 | HR 59 | Temp 98.1°F | Ht 72.0 in | Wt 206.0 lb

## 2020-05-19 DIAGNOSIS — E039 Hypothyroidism, unspecified: Secondary | ICD-10-CM

## 2020-05-19 DIAGNOSIS — E118 Type 2 diabetes mellitus with unspecified complications: Secondary | ICD-10-CM

## 2020-05-19 DIAGNOSIS — R7303 Prediabetes: Secondary | ICD-10-CM | POA: Diagnosis not present

## 2020-05-19 DIAGNOSIS — E890 Postprocedural hypothyroidism: Secondary | ICD-10-CM | POA: Diagnosis not present

## 2020-05-19 DIAGNOSIS — I10 Essential (primary) hypertension: Secondary | ICD-10-CM

## 2020-05-19 DIAGNOSIS — E7800 Pure hypercholesterolemia, unspecified: Secondary | ICD-10-CM | POA: Diagnosis not present

## 2020-05-19 DIAGNOSIS — R972 Elevated prostate specific antigen [PSA]: Secondary | ICD-10-CM | POA: Diagnosis not present

## 2020-05-19 DIAGNOSIS — N201 Calculus of ureter: Secondary | ICD-10-CM | POA: Diagnosis not present

## 2020-05-19 DIAGNOSIS — M4726 Other spondylosis with radiculopathy, lumbar region: Secondary | ICD-10-CM

## 2020-05-19 LAB — CBC WITH DIFFERENTIAL/PLATELET
Basophils Absolute: 0 10*3/uL (ref 0.0–0.2)
Basos: 1 %
EOS (ABSOLUTE): 0.1 10*3/uL (ref 0.0–0.4)
Eos: 2 %
Hematocrit: 40.5 % (ref 37.5–51.0)
Hemoglobin: 14.1 g/dL (ref 13.0–17.7)
Immature Grans (Abs): 0 10*3/uL (ref 0.0–0.1)
Immature Granulocytes: 0 %
Lymphocytes Absolute: 1.2 10*3/uL (ref 0.7–3.1)
Lymphs: 25 %
MCH: 34 pg — ABNORMAL HIGH (ref 26.6–33.0)
MCHC: 34.8 g/dL (ref 31.5–35.7)
MCV: 98 fL — ABNORMAL HIGH (ref 79–97)
Monocytes Absolute: 0.3 10*3/uL (ref 0.1–0.9)
Monocytes: 7 %
Neutrophils Absolute: 3 10*3/uL (ref 1.4–7.0)
Neutrophils: 65 %
Platelets: 143 10*3/uL — ABNORMAL LOW (ref 150–450)
RBC: 4.15 x10E6/uL (ref 4.14–5.80)
RDW: 13.3 % (ref 11.6–15.4)
WBC: 4.6 10*3/uL (ref 3.4–10.8)

## 2020-05-19 LAB — LIPID PANEL
Chol/HDL Ratio: 2.4 ratio (ref 0.0–5.0)
Cholesterol, Total: 99 mg/dL — ABNORMAL LOW (ref 100–199)
HDL: 42 mg/dL (ref >39)
LDL Chol Calc (NIH): 41 mg/dL (ref 0–99)
Triglycerides: 74 mg/dL (ref 0–149)
VLDL Cholesterol Cal: 16 mg/dL (ref 5–40)

## 2020-05-19 LAB — CMP14+EGFR
ALT: 24 IU/L (ref 0–44)
AST: 17 IU/L (ref 0–40)
Albumin/Globulin Ratio: 2.3 — ABNORMAL HIGH (ref 1.2–2.2)
Albumin: 4.2 g/dL (ref 3.7–4.7)
Alkaline Phosphatase: 52 IU/L (ref 48–121)
BUN/Creatinine Ratio: 19 (ref 10–24)
BUN: 20 mg/dL (ref 8–27)
Bilirubin Total: 0.9 mg/dL (ref 0.0–1.2)
CO2: 20 mmol/L (ref 20–29)
Calcium: 9 mg/dL (ref 8.6–10.2)
Chloride: 110 mmol/L — ABNORMAL HIGH (ref 96–106)
Creatinine, Ser: 1.04 mg/dL (ref 0.76–1.27)
GFR calc Af Amer: 83 mL/min/{1.73_m2} (ref >59)
GFR calc non Af Amer: 72 mL/min/{1.73_m2} (ref >59)
Globulin, Total: 1.8 g/dL (ref 1.5–4.5)
Glucose: 125 mg/dL — ABNORMAL HIGH (ref 65–99)
Potassium: 4.7 mmol/L (ref 3.5–5.2)
Sodium: 141 mmol/L (ref 134–144)
Total Protein: 6 g/dL (ref 6.0–8.5)

## 2020-05-19 LAB — BAYER DCA HB A1C WAIVED: HB A1C (BAYER DCA - WAIVED): 6 % (ref <7.0)

## 2020-05-19 MED ORDER — OLMESARTAN MEDOXOMIL 40 MG PO TABS: 40.0000 mg | ORAL_TABLET | Freq: Every day | ORAL | 3 refills | Status: DC

## 2020-05-19 MED ORDER — ROSUVASTATIN CALCIUM 40 MG PO TABS: 40.0000 mg | ORAL_TABLET | Freq: Every day | ORAL | 3 refills | Status: DC

## 2020-05-19 MED ORDER — LEVOTHYROXINE SODIUM 125 MCG PO TABS: 125.0000 ug | ORAL_TABLET | Freq: Every day | ORAL | 3 refills | Status: DC

## 2020-05-19 MED ORDER — METFORMIN HCL ER 500 MG PO TB24: ORAL_TABLET | ORAL | 3 refills | Status: DC

## 2020-05-19 NOTE — Addendum Note (Signed)
Addended by: Caryl Pina on: 05/19/2020 11:02 AM   Modules accepted: Orders

## 2020-05-19 NOTE — Progress Notes (Signed)
BP 103/66   Pulse (!) 59   Temp 98.1 F (36.7 C)   Ht 6' (1.829 m)   Wt 206 lb (93.4 kg)   SpO2 96%   BMI 27.94 kg/m    Subjective:   Patient ID: Bradley Wong, male    DOB: 13-Nov-1945, 74 y.o.   MRN: [id number]  HPI: Bradley Wong is a 74 y.o. male presenting on 05/19/2020 for Medical Management of Chronic Issues, Diabetes, and Hypothyroidism   HPI Prostate enlarged and elevated PSA Patient sees Dr. Jeffie Pollock for management of this.  Hypothyroidism Patient sees endocrinology for management of this.  Prediabetes Patient comes in today for recheck of his diabetes. Patient has been currently taking Metformin 500 daily, A1c 6.0. Patient is currently on an ACE inhibitor/ARB. Patient has not seen an ophthalmologist this year. Patient denies any issues with their feet. The symptom started onset as an adult hyperlipidemia and hypertension ARE RELATED TO DM   Hyperlipidemia Patient is coming in for recheck of his hyperlipidemia. The patient is currently taking Crestor. They deny any issues with myalgias or history of liver damage from it. They deny any focal numbness or weakness or chest pain.   Hypertension Patient is currently on olmesartan, and their blood pressure today is 103/66.  Patient does complain of the occasional orthostatic dizziness. Patient denies headaches, blurred vision, chest pains, shortness of breath, or weakness. Denies any side effects from medication and is content with current medication.   Patient has a history of degenerative disc disease and has seen Dr. Joya Salm in the past for injection, he would like to go back to see their office, Dr. Joya Salm is retired so we will see where we can get him to.  Relevant past medical, surgical, family and social history reviewed and updated as indicated. Interim medical history since our last visit reviewed. Allergies and medications reviewed and updated.  Review of Systems  Constitutional: Negative for chills and fever.   Eyes: Negative for visual disturbance.  Respiratory: Negative for shortness of breath and wheezing.   Cardiovascular: Negative for chest pain and leg swelling.  Musculoskeletal: Positive for arthralgias and back pain. Negative for gait problem.  Skin: Negative for rash.  Neurological: Positive for dizziness and light-headedness. Negative for weakness and numbness.  All other systems reviewed and are negative.   Per HPI unless specifically indicated above   Allergies as of 05/19/2020      Reactions   Niaspan [niacin Er] Other (See Comments)   flushing   Penicillins Rash   Has patient had a PCN reaction causing immediate rash, facial/tongue/throat swelling, SOB or lightheadedness with hypotension: No Has patient had a PCN reaction causing severe rash involving mucus membranes or skin necrosis: No Has patient had a PCN reaction that required hospitalization: Unknown Has patient had a PCN reaction occurring within the last 10 years: No If all of the above answers are "NO", then may proceed with Cephalosporin use.      Medication List       Accurate as of May 19, 2020 10:42 AM. If you have any questions, ask your nurse or doctor.        STOP taking these medications   HYDROcodone-acetaminophen 10-325 MG tablet Commonly known as: [place] Stopped by: Fransisca Kaufmann Ymani Porcher, MD     TAKE these medications   aspirin 81 MG tablet Take 81 mg by mouth daily.   levothyroxine 125 MCG tablet Commonly known as: SYNTHROID Take 1 tablet (125 mcg total) by  mouth daily before breakfast.   metFORMIN 500 MG 24 hr tablet Commonly known as: GLUCOPHAGE-XR 1 tablet daily   olmesartan 40 MG tablet Commonly known as: BENICAR Take 1 tablet (40 mg total) by mouth daily.   rosuvastatin 40 MG tablet Commonly known as: CRESTOR Take 1 tablet (40 mg total) by mouth daily.   tamsulosin 0.4 MG Caps capsule Commonly known as: FLOMAX Take 0.4 mg by mouth daily.   Vitamin D-3 125 MCG (5000 UT)  Tabs Take 5,000 Units by mouth daily.        Objective:   BP 103/66   Pulse (!) 59   Temp 98.1 F (36.7 C)   Ht 6' (1.829 m)   Wt 206 lb (93.4 kg)   SpO2 96%   BMI 27.94 kg/m   Wt Readings from Last 3 Encounters:  05/19/20 206 lb (93.4 kg)  07/17/19 221 lb 12.8 oz (100.6 kg)  06/13/19 218 lb 3.2 oz (99 kg)    Physical Exam Vitals and nursing note reviewed.  Constitutional:      General: He is not in acute distress.    Appearance: He is well-developed. He is not diaphoretic.  Eyes:     General: No scleral icterus.    Conjunctiva/sclera: Conjunctivae normal.  Neck:     Thyroid: No thyromegaly.  Cardiovascular:     Rate and Rhythm: Normal rate and regular rhythm.     Heart sounds: Normal heart sounds. No murmur heard.   Pulmonary:     Effort: Pulmonary effort is normal. No respiratory distress.     Breath sounds: Normal breath sounds. No wheezing.  Abdominal:     General: Abdomen is flat. Bowel sounds are normal. There is no distension.     Tenderness: There is no abdominal tenderness. There is no guarding or rebound.  Musculoskeletal:        General: Normal range of motion.     Cervical back: Neck supple.  Lymphadenopathy:     Cervical: No cervical adenopathy.  Skin:    General: Skin is warm and dry.     Findings: No rash.  Neurological:     Mental Status: He is alert and oriented to person, place, and time.     Coordination: Coordination normal.  Psychiatric:        Behavior: Behavior normal.       Assessment & Plan:   Problem List Items Addressed This Visit      Cardiovascular and Mediastinum   Essential hypertension, benign (Chronic)   Relevant Medications   rosuvastatin (CRESTOR) 40 MG tablet   olmesartan (BENICAR) 40 MG tablet   Other Relevant Orders   CBC with Differential/Platelet     Endocrine   Hypothyroidism, postsurgical   Relevant Medications   levothyroxine (SYNTHROID) 125 MCG tablet   RESOLVED: Thyroid activity decreased    Relevant Medications   levothyroxine (SYNTHROID) 125 MCG tablet   Other Relevant Orders   CBC with Differential/Platelet     Other   Elevated PSA   Hyperlipemia   Relevant Medications   rosuvastatin (CRESTOR) 40 MG tablet   olmesartan (BENICAR) 40 MG tablet   Other Relevant Orders   Lipid panel   Pre-diabetes   Relevant Orders   CMP14+EGFR    Other Visit Diagnoses    Controlled type 2 diabetes mellitus with complication, without long-term current use of insulin (HCC)    -  Primary   Relevant Medications   rosuvastatin (CRESTOR) 40 MG tablet   olmesartan (BENICAR) 40  MG tablet   metFORMIN (GLUCOPHAGE-XR) 500 MG 24 hr tablet   Other Relevant Orders   Bayer DCA Hb A1c Waived      Continue current medication, no changes except for that he is going to cut back on his Metformin and only take it every other day because he is doing so well and we will also cut back on his blood pressure pill to 20 mg olmesartan daily Follow up plan: Return in about 6 months (around 11/19/2020), or if symptoms worsen or fail to improve, for Prediabetes and hypertension.  Counseling provided for all of the vaccine components Orders Placed This Encounter  Procedures  . Bayer DCA Hb A1c Waived  . CBC with Differential/Platelet  . CMP14+EGFR  . Lipid panel    Caryl Pina, MD [facility] 05/19/2020, 10:42 AM

## 2020-05-20 ENCOUNTER — Other Ambulatory Visit: Payer: Self-pay | Admitting: Urology

## 2020-05-24 ENCOUNTER — Other Ambulatory Visit (HOSPITAL_COMMUNITY)
Admission: RE | Admit: 2020-05-24 | Discharge: 2020-05-24 | Disposition: A | Discharge status: Home / Self Care | Payer: Medicare Other | Source: Ambulatory Visit | Attending: Urology | Admitting: Urology

## 2020-05-24 DIAGNOSIS — Z01812 Encounter for preprocedural laboratory examination: Secondary | ICD-10-CM | POA: Diagnosis not present

## 2020-05-24 DIAGNOSIS — Z20822 Contact with and (suspected) exposure to covid-19: Secondary | ICD-10-CM | POA: Insufficient documentation

## 2020-05-24 LAB — SARS CORONAVIRUS 2 (TAT 6-24 HRS): SARS Coronavirus 2: NEGATIVE

## 2020-05-24 NOTE — Progress Notes (Signed)
Pre op phone call completed.  Pt has stopped taking Aspirin.  He will arrive Thursday at 1200 for 2pm procedure.  Spouse and pt aware of medications to take morning of procedure.

## 2020-05-26 NOTE — H&P (Signed)
Office Visit Report     05/19/2020   --------------------------------------------------------------------------------   Bradley Wong  MRN: [id number]  DOB: 10/26/1946, 74 year old Male  SSN: [id number]   PRIMARY CARE:  Chipper Herb, MD  REFERRING:  Chipper Herb, MD  PROVIDER:  Irine Seal, M.D.  TREATING:  Daine Gravel, NP  LOCATION:  [facility] [phone]     --------------------------------------------------------------------------------   CC: I have had kidney stone surgery.  HPI: Bradley Wong is a 74 year-old male established patient who is here for renal calculi after a surgical intervention.  05/19/2020: 74 year old male who presents today for follow-up of a right 5 mm obstructing calculus for which he is undergoing medical expulsive therapy for the past three weeks. Since his last office visit, he has not seen a stone passed. He does report some intermittent right flank pain and nausea. He denies gross hematuria, fevers, chills, vomiting. He would like to proceed with ESWL if possible.   04/28/20: Bradley Wong returns today in f/u for his history of stones. He had some right flank pain over the last 1-1.5wks. He had pain with standing 3 x but the pain radiated into his leg. He had no hematuria or nausea. His IPSS is 10. He had COVID in 1/21 and had nausea for about 2 months afterwards. He continues to have some issues with his GI tract since COVID with increased gas.    04/23/19: Bradley Wong returns today in f/u for his history of stones. He had ESWL in 12/19 for a left ureteral stone. He his doing well today without flank pain or hematuira. he is voiding well with an IPSS of 8. A KUB today shows a stable 8.82m RMP stone and a stable 378mLLP stone.     The stone was on the right side. This procedure was done 09/30/2018. He did not pass a stone since the last office visit. This is not his first kidney stone. He does not have a stent in place.   He is not  currently having flank pain, back pain, groin pain, nausea, vomiting, fever or chills.   He does not have dysuria. He does not have urgency. He does not have frequency.     ALLERGIES: Penicillins    MEDICATIONS: Tamsulosin Hcl 0.4 mg capsule 1 capsule PO Daily  Aspirin 81 MG TABS Oral  Benicar 40 MG Oral Tablet Oral  Crestor 40 MG Oral Tablet Oral  Synthroid 112 MCG Oral Tablet Oral  Tadalafil 5 mg tablet 1 tablet PO Daily  Vitamin D-3 5000 UNIT TABS Oral     Notes: He is on a med for DM and thinks it is metformin and his sugar is down.    GU PSH: Cysto Remove Stent FB Sim - 10/07/2018 Cystoscopy Insert Stent, Left - 09/20/2018 ESWL, Left - 09/30/2018       PSH Notes: Nerve Block Transforaminal Epidural, Thyroid Surgery Total Thyroidectomy, Knee Surgery Right   Kidney Stone   NON-GU PSH: Remove Thyroid - 2014     GU PMH: BPH w/LUTS, He is doing well on tamsulosin with mild LUTS. Med refilled. - 04/28/2020, Benign prostatic hyperplasia with urinary obstruction, - 2015 ED due to arterial insufficiency, He no longer responds to sildenafil 10070mince having COVID. I discussed options including trying tadalafil, combination of tadalafil and sildenafil in lower doses, Shockwave therapy, injection therapy, vacuum therapy, MUSE and penile prostheses. HE would like to try the tadalafil and will combine with sildenafil at half dosing. -  04/28/2020, He is doing well on sildenafil. , - 04/23/2019, He has issues with tumescence and inability to maintain erection for satisfactory completion of sexual activity., - 2020, He has a new complaint of ED. He has DM and HTN and the cause is likely vascular. I discussed the benefits of weight loss and exercise. He will try going off of tamsulosin but I don't know that that is likely to help. If it doesn't he will let me know and we can try a PDE5. , - 09/13/2018 Ureteral calculus, The stone was previously in the kidney but is now in the mid ureter and he has  no pain or obstruction currently. I discussed MET, URS and ESWL and will have him return in 3 weeks for a KUB and if the stone has not progressed, I will get him set up for ESWL. I reviewed the risks of ESWL including bleeding, infection, injury to the kidney or adjacent structures, failure to fragment the stone, need for ancillary procedures, thrombotic events, cardiac arrhythmias and sedation complications. He did have renal stones on CT as well. - 04/28/2020, - 09/17/2018 Renal calculus, He has stable renal stones and will return in 1 year. - 04/23/2019, Bilateral, - 2020 Renal and ureteral calculus - 80/10/6551 Renal colic - 74/82/7078 Elevated PSA, His PSA is down. I will have him return in a year with a PSA. - 09/13/2018, His PSA is up some but below his prior high. I will recheck in 6 months and have him f/u in a year with a PSA. , - 2018 (Stable), - 2017, Elevated prostate specific antigen (PSA), - 2016 Nocturia (Stable) - 2017 Prostate, Neoplasm of uncertain behavior, Neoplasm of uncertain behavior of prostate - 2016 Renal cyst, Renal cyst, acquired, left - 2015 Dysuria, Dysuria - 2014 Urinary Frequency, Urinary frequency - 2014      PMH Notes:  2013-01-17 08:32:44 - Note: Thyroid Cancer  2013-01-17 08:32:44 - Note: Arthritis   NON-GU PMH: Encounter for general adult medical examination without abnormal findings, Encounter for preventive health examination - 2017 Prediabetes, Borderline diabetes - 2016 Other intervertebral disc degeneration, lumbar region, Lumbar Disc Degeneration - 2014 Personal history of other diseases of the circulatory system, History of hypertension - 2014 Personal history of other diseases of the digestive system, History of esophageal reflux - 2014 Personal history of other endocrine, nutritional and metabolic disease, History of hypercholesterolemia - 2014 Atherosclerosis of aorta    FAMILY HISTORY: Cancer - Runs In Family Congestive Heart Failure - Runs In  Family Death In The Family Father - Runs In Family Death In The Family Mother - Runs In Family Family Health Status Number - Runs In Family renal failure - Runs In Family   SOCIAL HISTORY: Marital Status: Married Current Smoking Status: Patient has never smoked.   Tobacco Use Assessment Completed: Used Tobacco in last 30 days? Has never drank.      Notes: Occupation: Retired, Alcohol Use, Never A Smoker, Caffeine Use, Marital History - Currently Married   REVIEW OF SYSTEMS:    GU Review Male:   Patient denies frequent urination, hard to postpone urination, burning/ pain with urination, get up at night to urinate, leakage of urine, stream starts and stops, trouble starting your stream, have to strain to urinate , erection problems, and penile pain.  Gastrointestinal (Upper):   Patient denies nausea, vomiting, and indigestion/ heartburn.  Gastrointestinal (Lower):   Patient denies diarrhea and constipation.  Constitutional:   Patient denies fever, night sweats, weight loss, and  fatigue.  Skin:   Patient denies skin rash/ lesion and itching.  Eyes:   Patient denies blurred vision and double vision.  Ears/ Nose/ Throat:   Patient denies sore throat and sinus problems.  Hematologic/Lymphatic:   Patient denies easy bruising and swollen glands.  Cardiovascular:   Patient denies leg swelling and chest pains.  Respiratory:   Patient denies cough and shortness of breath.  Endocrine:   Patient denies excessive thirst.  Musculoskeletal:   Patient denies back pain and joint pain.  Neurological:   Patient denies headaches and dizziness.  Psychologic:   Patient denies depression and anxiety.   VITAL SIGNS:      05/19/2020 02:42 PM  Weight 205 lb / 92.99 kg  Height 74 in / 187.96 cm  BP 146/88 mmHg  Pulse 65 /min  Temperature 97.8 F / 36.5 C  BMI 26.3 kg/m   GU PHYSICAL EXAMINATION:      Notes: NO CVAT   MULTI-SYSTEM PHYSICAL EXAMINATION:    Constitutional: Well-nourished. No physical  deformities. Normally developed. Good grooming.  Respiratory: No labored breathing, no use of accessory muscles.   Cardiovascular: Normal temperature, normal extremity pulses, no swelling, no varicosities.  Skin: No paleness, no jaundice, no cyanosis. No lesion, no ulcer, no rash.  Neurologic / Psychiatric: Oriented to time, oriented to place, oriented to person. No depression, no anxiety, no agitation.  Gastrointestinal: No mass, no tenderness, no rigidity, non obese abdomen.  Musculoskeletal: Normal gait and station of head and neck.     Complexity of Data:  Source Of History:  Patient, Medical Record Summary  Records Review:   Previous Doctor Records, Previous Patient Records  Urine Test Review:   Urinalysis, Urine Culture  X-Ray Review: KUB: Reviewed Films. Discussed With Patient.  C.T. Abdomen/Pelvis: Reviewed Films. Discussed With Patient.     04/21/20 08/31/18 09/09/17 04/29/17 08/15/16 01/17/13  PSA  Total PSA 3.83 ng/mL 3.6 ng/dl 4.9 ng/dl 4.4 ng/dl 3.9 ng/dl 6.70   % Free PSA     17.9 %     05/19/20  Urinalysis  Urine Appearance Clear   Urine Color Yellow   Urine Glucose Neg mg/dL  Urine Bilirubin Neg mg/dL  Urine Ketones Neg mg/dL  Urine Specific Gravity 1.015   Urine Blood Neg ery/uL  Urine pH 7.5   Urine Protein Trace mg/dL  Urine Urobilinogen 0.2 mg/dL  Urine Nitrites Neg   Urine Leukocyte Esterase Neg leu/uL   PROCEDURES:         KUB - 74018  A single view of the abdomen is obtained. Bilateral renal shadows are well visualized. There are opacities noted within the left renal shadow. There are no visualized opacities noted within the right renal shadow. Along the expected course of the distal right ureter within the pelvic inlet there is a approximately 5 mm opacity noted. This appears to have slightly moved from the previous imaging. There are no other opacities noted along the expected course of the left or the right ureter.      . Patient confirmed No  Neulasta OnPro Device.           Urinalysis Dipstick Dipstick Cont'd  Color: Yellow Bilirubin: Neg mg/dL  Appearance: Clear Ketones: Neg mg/dL  Specific Gravity: 1.015 Blood: Neg ery/uL  pH: 7.5 Protein: Trace mg/dL  Glucose: Neg mg/dL Urobilinogen: 0.2 mg/dL    Nitrites: Neg    Leukocyte Esterase: Neg leu/uL    ASSESSMENT:      ICD-10 Details  1 GU:  Ureteral calculus - N20.1 Right, Acute, Uncomplicated   PLAN:           Orders Labs CULTURE, URINE  X-Rays: KUB          Schedule Return Visit/Planned Activity: Next Available Appointment - Schedule Surgery          Document Letter(s):  Created for Patient: Clinical Summary         Notes:   Urinalysis is not concerning for an infectious process. I will send for precautionary culture. He has undergone medical expulsive therapy for the past 3 weeks and he has not seen a stone pass in his urine. At this time, he is ready to proceed with further surgical intervention. KUB were ordered today to see if the stone has progressed. Right-sided 5 mm opacity noted within the pelvic inlet. Easily seen on KUB today. Will proceed to set him up for ESWL. He may continue his pain medication, tamsulosin, and nausea medicine as needed. Urine was sent for precautionary culture. Strict return precautions advised in the interval. Surgical she handed the scheduler. Advised that they would be calling him for with the date of his ESWL as well as further instructions.        Next Appointment:      Next Appointment: 05/27/2020 02:00 PM    Appointment Type: Surgery     Location: [facility] [phone] [id number]    Provider: Raynelle Bring, M.D.    Reason for Visit: OP NE RT ESWL      * Signed by Daine Gravel, NP on 05/23/20 at 9:37 AM (EDT)*

## 2020-05-27 ENCOUNTER — Encounter (HOSPITAL_BASED_OUTPATIENT_CLINIC_OR_DEPARTMENT_OTHER): Payer: Self-pay | Admitting: Anesthesiology

## 2020-05-27 ENCOUNTER — Ambulatory Visit (HOSPITAL_COMMUNITY): Payer: Medicare Other

## 2020-05-27 ENCOUNTER — Ambulatory Visit (HOSPITAL_BASED_OUTPATIENT_CLINIC_OR_DEPARTMENT_OTHER)
Admission: RE | Admit: 2020-05-27 | Discharge: 2020-05-27 | Disposition: A | Discharge status: Home / Self Care | Payer: Medicare Other | Attending: Urology | Admitting: Urology

## 2020-05-27 ENCOUNTER — Encounter (HOSPITAL_BASED_OUTPATIENT_CLINIC_OR_DEPARTMENT_OTHER): Payer: Self-pay | Admitting: Urology

## 2020-05-27 ENCOUNTER — Encounter (HOSPITAL_BASED_OUTPATIENT_CLINIC_OR_DEPARTMENT_OTHER)
Admission: RE | Disposition: A | Discharge status: Home / Self Care | Payer: Self-pay | Source: Home / Self Care | Attending: Urology

## 2020-05-27 DIAGNOSIS — N200 Calculus of kidney: Secondary | ICD-10-CM | POA: Diagnosis not present

## 2020-05-27 DIAGNOSIS — Z79899 Other long term (current) drug therapy: Secondary | ICD-10-CM | POA: Insufficient documentation

## 2020-05-27 DIAGNOSIS — Z8249 Family history of ischemic heart disease and other diseases of the circulatory system: Secondary | ICD-10-CM | POA: Insufficient documentation

## 2020-05-27 DIAGNOSIS — Z87442 Personal history of urinary calculi: Secondary | ICD-10-CM | POA: Insufficient documentation

## 2020-05-27 DIAGNOSIS — N201 Calculus of ureter: Secondary | ICD-10-CM | POA: Insufficient documentation

## 2020-05-27 DIAGNOSIS — Z841 Family history of disorders of kidney and ureter: Secondary | ICD-10-CM | POA: Diagnosis not present

## 2020-05-27 DIAGNOSIS — I10 Essential (primary) hypertension: Secondary | ICD-10-CM | POA: Diagnosis not present

## 2020-05-27 DIAGNOSIS — R7303 Prediabetes: Secondary | ICD-10-CM | POA: Diagnosis not present

## 2020-05-27 DIAGNOSIS — Z7982 Long term (current) use of aspirin: Secondary | ICD-10-CM | POA: Diagnosis not present

## 2020-05-27 DIAGNOSIS — Z7984 Long term (current) use of oral hypoglycemic drugs: Secondary | ICD-10-CM | POA: Diagnosis not present

## 2020-05-27 DIAGNOSIS — N202 Calculus of kidney with calculus of ureter: Secondary | ICD-10-CM | POA: Diagnosis present

## 2020-05-27 DIAGNOSIS — Z880 Allergy status to penicillin: Secondary | ICD-10-CM | POA: Insufficient documentation

## 2020-05-27 HISTORY — PX: EXTRACORPOREAL SHOCK WAVE LITHOTRIPSY: SHX1557

## 2020-05-27 LAB — GLUCOSE, CAPILLARY: Glucose-Capillary: 101 mg/dL — ABNORMAL HIGH (ref 70–99)

## 2020-05-27 SURGERY — LITHOTRIPSY, ESWL
Anesthesia: LOCAL | Laterality: Right

## 2020-05-27 MED ORDER — DIPHENHYDRAMINE HCL 25 MG PO CAPS
ORAL_CAPSULE | ORAL | Status: AC
  Filled 2020-05-27: qty 1

## 2020-05-27 MED ORDER — DIAZEPAM 5 MG PO TABS
10.0000 mg | ORAL_TABLET | ORAL | Status: AC
  Administered 2020-05-27: 10 mg via ORAL

## 2020-05-27 MED ORDER — SODIUM CHLORIDE 0.9 % IV SOLN: INTRAVENOUS | Status: DC

## 2020-05-27 MED ORDER — CIPROFLOXACIN HCL 500 MG PO TABS
500.0000 mg | ORAL_TABLET | ORAL | Status: AC
  Administered 2020-05-27: 500 mg via ORAL

## 2020-05-27 MED ORDER — DIPHENHYDRAMINE HCL 25 MG PO CAPS
25.0000 mg | ORAL_CAPSULE | ORAL | Status: AC
  Administered 2020-05-27: 25 mg via ORAL

## 2020-05-27 MED ORDER — CIPROFLOXACIN HCL 500 MG PO TABS
ORAL_TABLET | ORAL | Status: AC
  Filled 2020-05-27: qty 1

## 2020-05-27 MED ORDER — DIAZEPAM 5 MG PO TABS
ORAL_TABLET | ORAL | Status: AC
  Filled 2020-05-27: qty 2

## 2020-05-27 NOTE — Discharge Instructions (Addendum)
  Post Anesthesia Home Care Instructions  Activity: Get plenty of rest for the remainder of the day. A responsible adult should stay with you for 24 hours following the procedure.  For the next 24 hours, DO NOT: -Drive a car -Paediatric nurse -Drink alcoholic beverages -Take any medication unless instructed by your physician -Make any legal decisions or sign important papers.  Meals: Start with liquid foods such as gelatin or soup. Progress to regular foods as tolerated. Avoid greasy, spicy, heavy foods. If nausea and/or vomiting occur, drink only clear liquids until the nausea and/or vomiting subsides. Call your physician if vomiting continues.  Special Instructions/Symptoms: Your throat may feel dry or sore from the anesthesia or the breathing tube placed in your throat during surgery. If this causes discomfort, gargle with warm salt water. The discomfort should disappear within 24 hours.  If you had a scopolamine patch placed behind your ear for the management of post- operative nausea and/or vomiting:  1. The medication in the patch is effective for 72 hours, after which it should be removed.  Wrap patch in a tissue and discard in the trash. Wash hands thoroughly with soap and water. 2. You may remove the patch earlier than 72 hours if you experience unpleasant side effects which may include dry mouth, dizziness or visual disturbances. 3. Avoid touching the patch. Wash your hands with soap and water after contact with the patch.   1. You should strain your urine and collect all fragments and bring them to your follow up appointment.  2. You should take your pain medication as needed.  Please call if your pain is severe to the point that it is not controlled with your pain medication. 3. You should call if you develop fever > 101 or persistent nausea or vomiting. 4. Your doctor may prescribe tamsulosin to take to help facilitate stone passage.

## 2020-05-27 NOTE — Op Note (Signed)
See [facility] operative note scanned into chart. Also because of the size, density, location and other factors that cannot be anticipated I feel this will likely be a staged procedure. This fact supersedes any indication in the scanned [facility] operative note to the contrary.  

## 2020-05-27 NOTE — Interval H&P Note (Signed)
History and Physical Interval Note:  05/27/2020 1:21 PM  Bradley Wong  has presented today for surgery, with the diagnosis of RIGHT OBSTRUCTING DISTAL URETERAL STONE.  The various methods of treatment have been discussed with the patient and family. After consideration of risks, benefits and other options for treatment, the patient has consented to  Procedure(s): RIGHT EXTRACORPOREAL SHOCK WAVE LITHOTRIPSY (ESWL) (Right) as a surgical intervention.  The patient's history has been reviewed, patient examined, no change in status, stable for surgery.  I have reviewed the patient's chart and labs.  Questions were answered to the patient's satisfaction.     [employer]

## 2020-05-31 ENCOUNTER — Encounter (HOSPITAL_BASED_OUTPATIENT_CLINIC_OR_DEPARTMENT_OTHER): Payer: Self-pay | Admitting: Urology

## 2020-06-02 DIAGNOSIS — N201 Calculus of ureter: Secondary | ICD-10-CM | POA: Diagnosis not present

## 2020-06-04 ENCOUNTER — Other Ambulatory Visit: Payer: Self-pay | Admitting: Urology

## 2020-06-04 ENCOUNTER — Encounter (HOSPITAL_BASED_OUTPATIENT_CLINIC_OR_DEPARTMENT_OTHER): Payer: Self-pay | Admitting: Urology

## 2020-06-04 DIAGNOSIS — M48061 Spinal stenosis, lumbar region without neurogenic claudication: Secondary | ICD-10-CM | POA: Insufficient documentation

## 2020-06-07 ENCOUNTER — Other Ambulatory Visit: Payer: Self-pay

## 2020-06-07 ENCOUNTER — Encounter (HOSPITAL_BASED_OUTPATIENT_CLINIC_OR_DEPARTMENT_OTHER): Payer: Self-pay | Admitting: Urology

## 2020-06-07 ENCOUNTER — Other Ambulatory Visit (HOSPITAL_COMMUNITY)
Admission: RE | Admit: 2020-06-07 | Discharge: 2020-06-07 | Disposition: A | Discharge status: Home / Self Care | Payer: Medicare Other | Source: Ambulatory Visit | Attending: Urology | Admitting: Urology

## 2020-06-07 DIAGNOSIS — Z01812 Encounter for preprocedural laboratory examination: Secondary | ICD-10-CM | POA: Diagnosis not present

## 2020-06-07 DIAGNOSIS — Z20822 Contact with and (suspected) exposure to covid-19: Secondary | ICD-10-CM | POA: Diagnosis not present

## 2020-06-07 LAB — SARS CORONAVIRUS 2 (TAT 6-24 HRS): SARS Coronavirus 2: NEGATIVE

## 2020-06-07 NOTE — Progress Notes (Signed)
Spoke w/ via phone for pre-op interview--- PT Lab needs dos---- Istat              Lab results------ current ekg in epic/ chart COVID test ------ 06-07-2020 @ 1230 Arrive at ------- 0830 NPO after MN NO Solid Food.  Clear liquids from MN until--- 0730 Medications to take morning of surgery ----- Crestor, Synthroid w/ sips of water Diabetic medication ----- do not take metformin of surgery Patient Special Instructions ----- n/a Pre-Op special Istructions ----- n/a Patient verbalized understanding of instructions that were given at this phone interview. Patient denies shortness of breath, chest pain, fever, cough at this phone interview.

## 2020-06-09 NOTE — Anesthesia Preprocedure Evaluation (Addendum)
Anesthesia Evaluation  Patient identified by MRN, date of birth, ID band Patient awake    Reviewed: Allergy & Precautions, NPO status , Patient's Chart, lab work & pertinent test results  History of Anesthesia Complications Negative for: history of anesthetic complications  Airway Mallampati: II  TM Distance: >3 FB Neck ROM: Full    Dental no notable dental hx.    Pulmonary neg pulmonary ROS,    Pulmonary exam normal        Cardiovascular hypertension, Pt. on medications + CAD  Normal cardiovascular exam     Neuro/Psych negative neurological ROS  negative psych ROS   GI/Hepatic negative GI ROS, Neg liver ROS,   Endo/Other  diabetes, Type 2, Oral Hypoglycemic AgentsHypothyroidism   Renal/GU Right ureteral stone  negative genitourinary   Musculoskeletal negative musculoskeletal ROS (+)   Abdominal   Peds  Hematology negative hematology ROS (+)   Anesthesia Other Findings Day of surgery medications reviewed with patient.  Reproductive/Obstetrics negative OB ROS                            Anesthesia Physical Anesthesia Plan  ASA: II  Anesthesia Plan: General   Post-op Pain Management:    Induction: Intravenous  PONV Risk Score and Plan: 2 and Treatment may vary due to age or medical condition, Ondansetron and Dexamethasone  Airway Management Planned: LMA  Additional Equipment: None  Intra-op Plan:   Post-operative Plan: Extubation in OR  Informed Consent:     Dental advisory given  Plan Discussed with: CRNA  Anesthesia Plan Comments:        Anesthesia Quick Evaluation

## 2020-06-10 ENCOUNTER — Encounter (HOSPITAL_BASED_OUTPATIENT_CLINIC_OR_DEPARTMENT_OTHER): Payer: Self-pay | Admitting: Urology

## 2020-06-10 ENCOUNTER — Ambulatory Visit (HOSPITAL_BASED_OUTPATIENT_CLINIC_OR_DEPARTMENT_OTHER): Payer: Medicare Other | Admitting: Anesthesiology

## 2020-06-10 ENCOUNTER — Ambulatory Visit (HOSPITAL_BASED_OUTPATIENT_CLINIC_OR_DEPARTMENT_OTHER)
Admission: RE | Admit: 2020-06-10 | Discharge: 2020-06-10 | Disposition: A | Discharge status: Home / Self Care | Payer: Medicare Other | Attending: Urology | Admitting: Urology

## 2020-06-10 ENCOUNTER — Other Ambulatory Visit: Payer: Self-pay

## 2020-06-10 ENCOUNTER — Encounter (HOSPITAL_BASED_OUTPATIENT_CLINIC_OR_DEPARTMENT_OTHER)
Admission: RE | Disposition: A | Discharge status: Home / Self Care | Payer: Self-pay | Source: Home / Self Care | Attending: Urology

## 2020-06-10 DIAGNOSIS — N401 Enlarged prostate with lower urinary tract symptoms: Secondary | ICD-10-CM | POA: Insufficient documentation

## 2020-06-10 DIAGNOSIS — Z7984 Long term (current) use of oral hypoglycemic drugs: Secondary | ICD-10-CM | POA: Diagnosis not present

## 2020-06-10 DIAGNOSIS — E119 Type 2 diabetes mellitus without complications: Secondary | ICD-10-CM | POA: Diagnosis not present

## 2020-06-10 DIAGNOSIS — N201 Calculus of ureter: Secondary | ICD-10-CM | POA: Diagnosis not present

## 2020-06-10 DIAGNOSIS — E7800 Pure hypercholesterolemia, unspecified: Secondary | ICD-10-CM | POA: Insufficient documentation

## 2020-06-10 DIAGNOSIS — Z85850 Personal history of malignant neoplasm of thyroid: Secondary | ICD-10-CM | POA: Insufficient documentation

## 2020-06-10 DIAGNOSIS — I700 Atherosclerosis of aorta: Secondary | ICD-10-CM | POA: Insufficient documentation

## 2020-06-10 DIAGNOSIS — R351 Nocturia: Secondary | ICD-10-CM | POA: Insufficient documentation

## 2020-06-10 DIAGNOSIS — I2510 Atherosclerotic heart disease of native coronary artery without angina pectoris: Secondary | ICD-10-CM | POA: Insufficient documentation

## 2020-06-10 DIAGNOSIS — I10 Essential (primary) hypertension: Secondary | ICD-10-CM | POA: Insufficient documentation

## 2020-06-10 DIAGNOSIS — Z880 Allergy status to penicillin: Secondary | ICD-10-CM | POA: Diagnosis not present

## 2020-06-10 DIAGNOSIS — Z79899 Other long term (current) drug therapy: Secondary | ICD-10-CM | POA: Insufficient documentation

## 2020-06-10 DIAGNOSIS — E890 Postprocedural hypothyroidism: Secondary | ICD-10-CM | POA: Diagnosis not present

## 2020-06-10 DIAGNOSIS — Z8616 Personal history of COVID-19: Secondary | ICD-10-CM | POA: Diagnosis not present

## 2020-06-10 DIAGNOSIS — Z79890 Hormone replacement therapy (postmenopausal): Secondary | ICD-10-CM | POA: Diagnosis not present

## 2020-06-10 DIAGNOSIS — Z87442 Personal history of urinary calculi: Secondary | ICD-10-CM | POA: Diagnosis not present

## 2020-06-10 HISTORY — DX: Presence of dental prosthetic device (complete) (partial): Z97.2

## 2020-06-10 HISTORY — DX: Presence of spectacles and contact lenses: Z97.3

## 2020-06-10 HISTORY — DX: Unspecified osteoarthritis, unspecified site: M19.90

## 2020-06-10 HISTORY — DX: Personal history of urinary calculi: Z87.442

## 2020-06-10 HISTORY — PX: CYSTOSCOPY/URETEROSCOPY/HOLMIUM LASER/STENT PLACEMENT: SHX6546

## 2020-06-10 HISTORY — DX: Calculus of ureter: N20.1

## 2020-06-10 HISTORY — DX: Type 2 diabetes mellitus without complications: E11.9

## 2020-06-10 HISTORY — DX: Solitary pulmonary nodule: R91.1

## 2020-06-10 HISTORY — DX: Atherosclerotic heart disease of native coronary artery without angina pectoris: I25.10

## 2020-06-10 HISTORY — DX: Mixed hyperlipidemia: E78.2

## 2020-06-10 LAB — GLUCOSE, CAPILLARY: Glucose-Capillary: 135 mg/dL — ABNORMAL HIGH (ref 70–99)

## 2020-06-10 LAB — POCT I-STAT, CHEM 8
BUN: 23 mg/dL (ref 8–23)
Calcium, Ion: 1.29 mmol/L (ref 1.15–1.40)
Chloride: 107 mmol/L (ref 98–111)
Creatinine, Ser: 1 mg/dL (ref 0.61–1.24)
Glucose, Bld: 137 mg/dL — ABNORMAL HIGH (ref 70–99)
HCT: 41 % (ref 39.0–52.0)
Hemoglobin: 13.9 g/dL (ref 13.0–17.0)
Potassium: 4.4 mmol/L (ref 3.5–5.1)
Sodium: 145 mmol/L (ref 135–145)
TCO2: 23 mmol/L (ref 22–32)

## 2020-06-10 SURGERY — CYSTOSCOPY/URETEROSCOPY/HOLMIUM LASER/STENT PLACEMENT
Anesthesia: General | Site: Ureter | Laterality: Right

## 2020-06-10 MED ORDER — KETOROLAC TROMETHAMINE 30 MG/ML IJ SOLN
INTRAMUSCULAR | Status: DC | PRN
Start: 2020-06-10 — End: 2020-06-10
  Administered 2020-06-10: 30 mg via INTRAVENOUS

## 2020-06-10 MED ORDER — SODIUM CHLORIDE 0.9% FLUSH: 3.0000 mL | INTRAVENOUS | Status: DC | PRN

## 2020-06-10 MED ORDER — EPHEDRINE SULFATE-NACL 50-0.9 MG/10ML-% IV SOSY
PREFILLED_SYRINGE | INTRAVENOUS | Status: DC | PRN
  Administered 2020-06-10: 15 mg via INTRAVENOUS
  Administered 2020-06-10: 10 mg via INTRAVENOUS

## 2020-06-10 MED ORDER — CIPROFLOXACIN IN D5W 400 MG/200ML IV SOLN
INTRAVENOUS | Status: AC
  Filled 2020-06-10: qty 200

## 2020-06-10 MED ORDER — KETOROLAC TROMETHAMINE 30 MG/ML IJ SOLN
INTRAMUSCULAR | Status: AC
  Filled 2020-06-10: qty 1

## 2020-06-10 MED ORDER — PROPOFOL 10 MG/ML IV BOLUS
INTRAVENOUS | Status: DC | PRN
  Administered 2020-06-10: 170 mg via INTRAVENOUS

## 2020-06-10 MED ORDER — OXYCODONE HCL 5 MG PO TABS: 5.0000 mg | ORAL_TABLET | ORAL | Status: DC | PRN

## 2020-06-10 MED ORDER — FENTANYL CITRATE (PF) 100 MCG/2ML IJ SOLN
INTRAMUSCULAR | Status: AC
  Filled 2020-06-10: qty 2

## 2020-06-10 MED ORDER — EPHEDRINE 5 MG/ML INJ
INTRAVENOUS | Status: AC
  Filled 2020-06-10: qty 10

## 2020-06-10 MED ORDER — FENTANYL CITRATE (PF) 100 MCG/2ML IJ SOLN: 25.0000 ug | INTRAMUSCULAR | Status: DC | PRN

## 2020-06-10 MED ORDER — PROMETHAZINE HCL 25 MG/ML IJ SOLN: 6.2500 mg | INTRAMUSCULAR | Status: DC | PRN

## 2020-06-10 MED ORDER — SODIUM CHLORIDE 0.9% FLUSH: 3.0000 mL | Freq: Two times a day (BID) | INTRAVENOUS | Status: DC

## 2020-06-10 MED ORDER — FENTANYL CITRATE (PF) 100 MCG/2ML IJ SOLN
INTRAMUSCULAR | Status: DC | PRN
  Administered 2020-06-10: 50 ug via INTRAVENOUS
  Administered 2020-06-10 (×2): 25 ug via INTRAVENOUS

## 2020-06-10 MED ORDER — OXYCODONE HCL 5 MG/5ML PO SOLN: 5.0000 mg | Freq: Once | ORAL | Status: DC | PRN

## 2020-06-10 MED ORDER — IOHEXOL 300 MG/ML  SOLN
INTRAMUSCULAR | Status: DC | PRN
  Administered 2020-06-10: 2 mL via URETHRAL

## 2020-06-10 MED ORDER — SODIUM CHLORIDE 0.9 % IV SOLN: 250.0000 mL | INTRAVENOUS | Status: DC | PRN

## 2020-06-10 MED ORDER — CIPROFLOXACIN IN D5W 400 MG/200ML IV SOLN
400.0000 mg | INTRAVENOUS | Status: AC
  Administered 2020-06-10: 400 mg via INTRAVENOUS

## 2020-06-10 MED ORDER — LIDOCAINE 2% (20 MG/ML) 5 ML SYRINGE
INTRAMUSCULAR | Status: AC
  Filled 2020-06-10: qty 5

## 2020-06-10 MED ORDER — LACTATED RINGERS IV SOLN: INTRAVENOUS | Status: DC

## 2020-06-10 MED ORDER — ONDANSETRON HCL 4 MG/2ML IJ SOLN
INTRAMUSCULAR | Status: DC | PRN
  Administered 2020-06-10: 4 mg via INTRAVENOUS

## 2020-06-10 MED ORDER — ACETAMINOPHEN 500 MG PO TABS
1000.0000 mg | ORAL_TABLET | Freq: Once | ORAL | Status: AC
  Administered 2020-06-10: 1000 mg via ORAL

## 2020-06-10 MED ORDER — ACETAMINOPHEN 325 MG RE SUPP: 650.0000 mg | RECTAL | Status: DC | PRN

## 2020-06-10 MED ORDER — PHENAZOPYRIDINE HCL 200 MG PO TABS
200.0000 mg | ORAL_TABLET | Freq: Three times a day (TID) | ORAL | 0 refills | Status: DC | PRN
Start: 2020-06-10 — End: 2020-07-07

## 2020-06-10 MED ORDER — PROPOFOL 10 MG/ML IV BOLUS
INTRAVENOUS | Status: AC
  Filled 2020-06-10: qty 20

## 2020-06-10 MED ORDER — MORPHINE SULFATE (PF) 4 MG/ML IV SOLN: 2.0000 mg | INTRAVENOUS | Status: DC | PRN

## 2020-06-10 MED ORDER — ONDANSETRON HCL 4 MG/2ML IJ SOLN
INTRAMUSCULAR | Status: AC
  Filled 2020-06-10: qty 2

## 2020-06-10 MED ORDER — ACETAMINOPHEN 500 MG PO TABS
ORAL_TABLET | ORAL | Status: AC
  Filled 2020-06-10: qty 2

## 2020-06-10 MED ORDER — OXYCODONE HCL 5 MG PO TABS: 5.0000 mg | ORAL_TABLET | Freq: Once | ORAL | Status: DC | PRN

## 2020-06-10 MED ORDER — SODIUM CHLORIDE 0.9 % IR SOLN
Status: DC | PRN
  Administered 2020-06-10: 3000 mL via INTRAVESICAL

## 2020-06-10 MED ORDER — ACETAMINOPHEN 325 MG PO TABS: 650.0000 mg | ORAL_TABLET | ORAL | Status: DC | PRN

## 2020-06-10 MED ORDER — OXYCODONE-ACETAMINOPHEN 5-325 MG PO TABS: 1.0000 | ORAL_TABLET | ORAL | 0 refills | Status: DC | PRN

## 2020-06-10 MED ORDER — LIDOCAINE 2% (20 MG/ML) 5 ML SYRINGE
INTRAMUSCULAR | Status: DC | PRN
  Administered 2020-06-10: 100 mg via INTRAVENOUS

## 2020-06-10 SURGICAL SUPPLY — 25 items
BAG DRAIN URO-CYSTO SKYTR STRL (DRAIN) ×3 IMPLANT
BAG DRN UROCATH (DRAIN) ×1
BASKET STONE 1.7 NGAGE (UROLOGICAL SUPPLIES) ×3 IMPLANT
BASKET ZERO TIP NITINOL 2.4FR (BASKET) IMPLANT
BSKT STON RTRVL ZERO TP 2.4FR (BASKET)
CATH URET 5FR 28IN CONE TIP (BALLOONS)
CATH URET 5FR 28IN OPEN ENDED (CATHETERS) ×3 IMPLANT
CATH URET 5FR 70CM CONE TIP (BALLOONS) IMPLANT
CLOTH BEACON ORANGE TIMEOUT ST (SAFETY) ×3 IMPLANT
ELECT REM PT RETURN 9FT ADLT (ELECTROSURGICAL)
ELECTRODE REM PT RTRN 9FT ADLT (ELECTROSURGICAL) IMPLANT
FIBER LASER FLEXIVA 365 (UROLOGICAL SUPPLIES) ×3 IMPLANT
FIBER LASER TRAC TIP (UROLOGICAL SUPPLIES) IMPLANT
GLOVE SURG SS PI 8.0 STRL IVOR (GLOVE) ×3 IMPLANT
GOWN STRL REUS W/TWL XL LVL3 (GOWN DISPOSABLE) ×3 IMPLANT
GUIDEWIRE ANG ZIPWIRE 038X150 (WIRE) IMPLANT
GUIDEWIRE STR DUAL SENSOR (WIRE) ×3 IMPLANT
IV NS IRRIG 3000ML ARTHROMATIC (IV SOLUTION) ×3 IMPLANT
KIT TURNOVER CYSTO (KITS) ×3 IMPLANT
MANIFOLD NEPTUNE II (INSTRUMENTS) ×3 IMPLANT
NS IRRIG 500ML POUR BTL (IV SOLUTION) ×3 IMPLANT
PACK CYSTO (CUSTOM PROCEDURE TRAY) ×3 IMPLANT
TUBE CONNECTING 12'X1/4 (SUCTIONS) ×1
TUBE CONNECTING 12X1/4 (SUCTIONS) ×2 IMPLANT
TUBING UROLOGY SET (TUBING) ×3 IMPLANT

## 2020-06-10 NOTE — Discharge Instructions (Addendum)
Ureteral Stent Implantation, Care After This sheet gives you information about how to care for yourself after your procedure. Your health care provider may also give you more specific instructions. If you have problems or questions, contact your health care provider. What can I expect after the procedure? After the procedure, it is common to have:  Nausea.  Mild pain when you urinate. You may feel this pain in your lower back or lower abdomen. The pain should stop within a few minutes after you urinate. This may last for up to 1 week.  A small amount of blood in your urine for several days. Follow these instructions at home: Medicines  Take over-the-counter and prescription medicines only as told by your health care provider.  If you were prescribed an antibiotic medicine, take it as told by your health care provider. Do not stop taking the antibiotic even if you start to feel better.  Do not drive for 24 hours if you were given a sedative during your procedure.  Ask your health care provider if the medicine prescribed to you requires you to avoid driving or using heavy machinery. Activity  Rest as told by your health care provider.  Avoid sitting for a long time without moving. Get up to take short walks every 1-2 hours. This is important to improve blood flow and breathing. Ask for help if you feel weak or unsteady.  Return to your normal activities as told by your health care provider. Ask your health care provider what activities are safe for you. General instructions   Watch for any blood in your urine. Call your health care provider if the amount of blood in your urine increases.  If you have a catheter: ? Follow instructions from your health care provider about taking care of your catheter and collection bag. ? Do not take baths, swim, or use a hot tub until your health care provider approves. Ask your health care provider if you may take showers. You may only be allowed to  take sponge baths.  Drink enough fluid to keep your urine pale yellow.  Do not use any products that contain nicotine or tobacco, such as cigarettes, e-cigarettes, and chewing tobacco. These can delay healing after surgery. If you need help quitting, ask your health care provider.  Keep all follow-up visits as told by your health care provider. This is important. Contact a health care provider if:  You have pain that gets worse or does not get better with medicine, especially pain when you urinate.  You have difficulty urinating.  You feel nauseous or you vomit repeatedly during a period of more than 2 days after the procedure. Get help right away if:  Your urine is dark red or has blood clots in it.  You are leaking urine (have incontinence).  The end of the stent comes out of your urethra.  You cannot urinate.  You have sudden, sharp, or severe pain in your abdomen or lower back.  You have a fever.  You have swelling or pain in your legs.  You have difficulty breathing. Summary  After the procedure, it is common to have mild pain when you urinate that goes away within a few minutes after you urinate. This may last for up to 1 week.  Watch for any blood in your urine. Call your health care provider if the amount of blood in your urine increases.  Take over-the-counter and prescription medicines only as told by your health care provider.  Drink   enough fluid to keep your urine pale yellow.  You may remove the stent by pulling the attached string on Monday morning.  If you don't feel you can do that, please call the office to have it done.   NO ADVIL, ALEVE, MOTRIN, IBUPROFEN UNTIL 535 PM TODAY  This information is not intended to replace advice given to you by your health care provider. Make sure you discuss any questions you have with your health care provider. Document Revised: 07/23/2018 Document Reviewed: 07/24/2018 Elsevier Patient Education  [place] Instructions  Activity: Get plenty of rest for the remainder of the day. A responsible adult should stay with you for 24 hours following the procedure.  For the next 24 hours, DO NOT: -Drive a car -Paediatric nurse -Drink alcoholic beverages -Take any medication unless instructed by your physician -Make any legal decisions or sign important papers.  Meals: Start with liquid foods such as gelatin or soup. Progress to regular foods as tolerated. Avoid greasy, spicy, heavy foods. If nausea and/or vomiting occur, drink only clear liquids until the nausea and/or vomiting subsides. Call your physician if vomiting continues.  Special Instructions/Symptoms: Your throat may feel dry or sore from the anesthesia or the breathing tube placed in your throat during surgery. If this causes discomfort, gargle with warm salt water. The discomfort should disappear within 24 hours.  If you had a scopolamine patch placed behind your ear for the management of post- operative nausea and/or vomiting:  1. The medication in the patch is effective for 72 hours, after which it should be removed.  Wrap patch in a tissue and discard in the trash. Wash hands thoroughly with soap and water. 2. You may remove the patch earlier than 72 hours if you experience unpleasant side effects which may include dry mouth, dizziness or visual disturbances. 3. Avoid touching the patch. Wash your hands with soap and water after contact with the patch.

## 2020-06-10 NOTE — Anesthesia Procedure Notes (Signed)
Procedure Name: LMA Insertion Date/Time: 06/10/2020 10:49 AM Performed by: Suan Halter, CRNA Pre-anesthesia Checklist: Patient identified, Emergency Drugs available, Suction available and Patient being monitored Patient Re-evaluated:Patient Re-evaluated prior to induction Oxygen Delivery Method: Circle system utilized Preoxygenation: Pre-oxygenation with 100% oxygen Ventilation: Mask ventilation without difficulty LMA: LMA inserted LMA Size: 4.0 and 5.0 Number of attempts: 1 Airway Equipment and Method: Bite block Placement Confirmation: positive ETCO2 Tube secured with: Tape Dental Injury: Teeth and Oropharynx as per pre-operative assessment

## 2020-06-10 NOTE — Interval H&P Note (Signed)
History and Physical Interval Note:  He has not seen any thing pass.   06/10/2020 10:28 AM  Bradley Wong  has presented today for surgery, with the diagnosis of RIGHT URETERAL STONE.  The various methods of treatment have been discussed with the patient and family. After consideration of risks, benefits and other options for treatment, the patient has consented to  Procedure(s): CYSTOSCOPY RIGHT RETROGRADE URETEROSCOPY/HOLMIUM LASER/STENT PLACEMENT (Right) as a surgical intervention.  The patient's history has been reviewed, patient examined, no change in status, stable for surgery.  I have reviewed the patient's chart and labs.  Questions were answered to the patient's satisfaction.     Irine Seal

## 2020-06-10 NOTE — H&P (Signed)
I have had kidney stone surgery.  HPI: Bradley Wong is a 74 year-old male established patient who is here for renal calculi after a surgical intervention.  06/02/20: Patient with below noted history. He is status post ESWL on 07/29. He presents today with complaints of persistent right flank pain. He states that his pain has becoming progressively more bothersome since his procedure. He has passed some small fragments since his procedure, but denies passage of fragments in the past 2 days. He complains of associated nausea and emesis. He called while I was on call last night and I did send in a prescription for Finder in. He has been using this and states that has been helpful for his nausea. He also used Percocet for pain last night, which was beneficial. He denies any significant voiding symptoms. No complaints of dysuria or gross hematuria. He denies measurable fevers, but has been having intermittent chills.   05/19/2020: 74 year old male who presents today for follow-up of a right 5 mm obstructing calculus for which he is undergoing medical expulsive therapy for the past three weeks. Since his last office visit, he has not seen a stone passed. He does report some intermittent right flank pain and nausea. He denies gross hematuria, fevers, chills, vomiting. He would like to proceed with ESWL if possible.   04/28/20: Bradley Wong returns today in f/u for his history of stones. He had some right flank pain over the last 1-1.5wks. He had pain with standing 3 x but the pain radiated into his leg. He had no hematuria or nausea. His IPSS is 10. He had COVID in 1/21 and had nausea for about 2 months afterwards. He continues to have some issues with his GI tract since COVID with increased gas.    04/23/19: Bradley Wong returns today in f/u for his history of stones. He had ESWL in 12/19 for a left ureteral stone. He his doing well today without flank pain or hematuira. he is voiding well with an IPSS of 8. A KUB today shows  a stable 8.60m RMP stone and a stable 372mLLP stone.     The stone was on the right side. He had ESWL for treatment of his renal calculi. Patient denies Stent, Ureteroscopy, and PCNL. This procedure was done 09/30/2018. He did pass a stone since the last office visit . This is not his first kidney stone. He does not have a stent in place.   He is currently having flank pain, nausea, vomiting, and chills. He denies having back pain, groin pain, and fever.   He does not have dysuria. He does not have urgency. He does not have frequency.     ALLERGIES: Penicillins    MEDICATIONS: Tamsulosin Hcl 0.4 mg capsule 1 capsule PO Daily  Aspirin 81 MG TABS Oral  Benicar 40 MG Oral Tablet Oral  Crestor 40 MG Oral Tablet Oral  Promethazine Hcl 12.5 mg tablet 1 tablet PO Q 6 H PRN For nausea  Synthroid 112 MCG Oral Tablet Oral  Vitamin D-3 5000 UNIT TABS Oral     Notes: He is on a med for DM and thinks it is metformin and his sugar is down.    GU PSH: Cysto Remove Stent FB Sim - 10/07/2018 Cystoscopy Insert Stent, Left - 09/20/2018 ESWL, Right - 05/27/2020, Left - 09/30/2018       PSH Notes: Nerve Block Transforaminal Epidural, Thyroid Surgery Total Thyroidectomy, Knee Surgery Right   Kidney Stone   NON-GU PSH: Remove Thyroid - 2014  GU PMH: Ureteral calculus - 05/19/2020, The stone was previously in the kidney but is now in the mid ureter and he has no pain or obstruction currently. I discussed MET, URS and ESWL and will have him return in 3 weeks for a KUB and if the stone has not progressed, I will get him set up for ESWL. I reviewed the risks of ESWL including bleeding, infection, injury to the kidney or adjacent structures, failure to fragment the stone, need for ancillary procedures, thrombotic events, cardiac arrhythmias and sedation complications. He did have renal stones on CT as well. , - 04/28/2020, - 09/17/2018 BPH w/LUTS, He is doing well on tamsulosin with mild LUTS. Med refilled.  - 04/28/2020, Benign prostatic hyperplasia with urinary obstruction, - 2015 ED due to arterial insufficiency, He no longer responds to sildenafil 129m since having COVID. I discussed options including trying tadalafil, combination of tadalafil and sildenafil in lower doses, Shockwave therapy, injection therapy, vacuum therapy, MUSE and penile prostheses. HE would like to try the tadalafil and will combine with sildenafil at half dosing. - 04/28/2020, He is doing well on sildenafil. , - 04/23/2019, He has issues with tumescence and inability to maintain erection for satisfactory completion of sexual activity., - 2020, He has a new complaint of ED. He has DM and HTN and the cause is likely vascular. I discussed the benefits of weight loss and exercise. He will try going off of tamsulosin but I don't know that that is likely to help. If it doesn't he will let me know and we can try a PDE5. , - 09/13/2018 Renal calculus, He has stable renal stones and will return in 1 year. - 04/23/2019, Bilateral, - 2020 Renal and ureteral calculus - 162/2/2979Renal colic - 189/21/1941Elevated PSA, His PSA is down. I will have him return in a year with a PSA. - 09/13/2018, His PSA is up some but below his prior high. I will recheck in 6 months and have him f/u in a year with a PSA. , - 2018 (Stable), - 2017, Elevated prostate specific antigen (PSA), - 2016 Nocturia (Stable) - 2017 Prostate, Neoplasm of uncertain behavior, Neoplasm of uncertain behavior of prostate - 2016 Renal cyst, Renal cyst, acquired, left - 2015 Dysuria, Dysuria - 2014 Urinary Frequency, Urinary frequency - 2014      PMH Notes:  2013-01-17 08:32:44 - Note: Thyroid Cancer  2013-01-17 08:32:44 - Note: Arthritis   NON-GU PMH: Encounter for general adult medical examination without abnormal findings, Encounter for preventive health examination - 2017 Prediabetes, Borderline diabetes - 2016 Other intervertebral disc degeneration, lumbar region, Lumbar  Disc Degeneration - 2014 Personal history of other diseases of the circulatory system, History of hypertension - 2014 Personal history of other diseases of the digestive system, History of esophageal reflux - 2014 Personal history of other endocrine, nutritional and metabolic disease, History of hypercholesterolemia - 2014 Atherosclerosis of aorta    FAMILY HISTORY: Cancer - Runs In Family Congestive Heart Failure - Runs In Family Death In The Family Father - Runs In Family Death In The Family Mother - Runs In Family Family Health Status Number - Runs In Family renal failure - Runs In Family   SOCIAL HISTORY: Marital Status: Married Current Smoking Status: Patient has never smoked.   Tobacco Use Assessment Completed: Used Tobacco in last 30 days? Has never drank.      Notes: Occupation: Retired, Alcohol Use, Never A Smoker, Caffeine Use, Marital History - Currently Married   REVIEW OF  SYSTEMS:    GU Review Male:   Patient reports frequent urination, hard to postpone urination, get up at night to urinate, and stream starts and stops. Patient denies burning/ pain with urination, leakage of urine, trouble starting your stream, have to strain to urinate , erection problems, and penile pain.  Gastrointestinal (Upper):   Patient reports nausea and vomiting. Patient denies indigestion/ heartburn.  Gastrointestinal (Lower):   Patient denies diarrhea and constipation.  Constitutional:   Patient reports night sweats and fatigue. Patient denies fever and weight loss.  Skin:   Patient denies itching and skin rash/ lesion.  Eyes:   Patient denies blurred vision and double vision.  Ears/ Nose/ Throat:   Patient denies sore throat and sinus problems.  Hematologic/Lymphatic:   Patient denies swollen glands and easy bruising.  Cardiovascular:   Patient denies leg swelling and chest pains.  Respiratory:   Patient denies cough and shortness of breath.  Endocrine:   Patient denies excessive thirst.   Musculoskeletal:   Patient reports joint pain. Patient denies back pain.  Neurological:   Patient denies headaches and dizziness.  Psychologic:   Patient denies depression and anxiety.   Notes: Nocturia 2x per night. Pt c/o right side pain.    VITAL SIGNS:      06/02/2020 02:03 PM  Weight 205 lb / 92.99 kg  Height 74 in / 187.96 cm  BP 114/78 mmHg  Heart Rate 58 /min  Temperature 98.4 F / 36.8 C  BMI 26.3 kg/m   MULTI-SYSTEM PHYSICAL EXAMINATION:    Constitutional: Well-nourished. No physical deformities. Normally developed. Good grooming.  Respiratory: No labored breathing, no use of accessory muscles.   Cardiovascular: Normal temperature, normal extremity pulses, no swelling, no varicosities.   Neurologic / Psychiatric: Oriented to time, oriented to place, oriented to person. No depression, no anxiety, no agitation.  Gastrointestinal: No mass, no tenderness, no rigidity, non obese abdomen.  Musculoskeletal: Normal gait and station of head and neck.     Complexity of Data:  Records Review:   Previous Patient Records  Urine Test Review:   Urinalysis, Urine Culture  X-Ray Review: C.T. Abdomen/Pelvis: Reviewed Films. Reviewed Report.     04/21/20 08/31/18 09/09/17 04/29/17 08/15/16 01/17/13  PSA  Total PSA 3.83 ng/mL 3.6 ng/dl 4.9 ng/dl 4.4 ng/dl 3.9 ng/dl 6.70   % Free PSA     17.9 %     PROCEDURES:         KUB - 74018  A single view of the abdomen is obtained. There is bowel gas overlying the left renal shadow today; however, there does appear to be a stable opacity noted in the vicinity of the left lower pole. I do not appreciate any obvious opacities noted along the expected anatomical course of the left ureter. No obvious opacities noted within the confines of the right renal shadow. Along the expected anatomical course overlying the bony pelvis, there continues to be an approximately 5 x 8 mm opacity. This may be somewhat smaller following ESWL, but does not appear to  have progressed much. Normal appearance of overlying bowel gas pattern. Stable degenerative disc disease and scoliosis.      Patient confirmed No Neulasta OnPro Device.            Urinalysis w/Scope Dipstick Dipstick Cont'd Micro  Color: Yellow Bilirubin: Neg mg/dL WBC/hpf: 0 - 5/hpf  Appearance: Clear Ketones: Neg mg/dL RBC/hpf: 0 - 2/hpf  Specific Gravity: 1.020 Blood: Trace ery/uL Bacteria: NS (Not Seen)  pH: <=  5.0 Protein: Neg mg/dL Cystals: NS (Not Seen)  Glucose: Neg mg/dL Urobilinogen: 0.2 mg/dL Casts: NS (Not Seen)    Nitrites: Neg Trichomonas: Not Present    Leukocyte Esterase: Neg leu/uL Mucous: Not Present      Epithelial Cells: 0 - 5/hpf      Yeast: NS (Not Seen)      Sperm: Not Present    ASSESSMENT:      ICD-10 Details  1 GU:   Renal colic - H65   2   Ureteral calculus - N20.1    PLAN:            Medications New Meds: Percocet 5 mg-325 mg tablet 1 tablet PO Q 6 H PRN For severe pain  #20  0 Refill(s)            Orders Labs Urine Culture  X-Rays: KUB  X-Ray Notes: History:  Hematuria: Yes/No  Patient to see MD after exam: Yes/No  Previous exam: CT / IVP/ US/ KUB/ None  When:  Where:  Diabetic: Yes/ No  BUN/ Creatinine:  Date of last BUN Creatinine:  Weight in pounds:  Allergy- IV Contrast: Yes/ No  Conflicting diabetic meds: Yes/ No  Diabetic Meds:  Prior Authorization #:            Schedule         Document Letter(s):  Created for Patient: Clinical Summary         Notes:   Precautionary culture sent today. On KUB imaging, there is bowel gas overlying the left renal shadow today; however, there does appear to be a stable opacity noted in the vicinity of the left lower pole. I do not appreciate any obvious opacities noted along the expected anatomical course of the left ureter. No obvious opacities noted within the confines of the right renal shadow. Along the expected anatomical course overlying the bony pelvis, there continues  to be an approximately 5 x 8 mm opacity. This may be somewhat smaller following ESWL, but does not appear to have progressed much. He is symptomatic. He is doing better today after use of pain medication and promethazine, but symptoms have been somewhat hard to manage. We reviewed options today in detail. He seems interested in proceeding with definitive intervention. We reviewed ureteroscopy and the potential risk in detail. He voiced understanding. Pain management strategies were discussed. Rx for Percocet was given for severe pain. [place] controlled substance database reviewed prior. Ultimately, I will review with his urologist for recommendations moving forward. I will keep him updated regarding these recommendations and follow-up will be pending. Strict return precautions were reviewed in the interim for fevers or refractory pain, nausea, or vomiting.

## 2020-06-10 NOTE — Op Note (Signed)
Procedure: 1.  Cystoscopy with retrograde pyelogram and interpretation. 2.  Right ureteroscopic stone extraction with holmium laser application and insertion of right double-J stent. 3.  Application of fluoroscopy.  Preop diagnosis: 5 mm right distal ureteral stone.  Postoperative diagnosis: Same.  Surgeon: Dr. Irine Seal.  Anesthesia: General.  Specimen: Stone fragments.  Drain: 6 [place] by 26 cm right contour double-J stent with tether.  EBL: None.  Complications: None.  Indications: The patient is a 74 year old male with a history of a 5 mm right distal ureteral stone that was initially treated on 729 with lithotripsy.  The stone failed to fragment or progress and he returns now for ureteroscopy.  Procedure: He was given antibiotics.  A general anesthetic was induced.  He was placed in lithotomy position and fitted with PAS hose.  His perineum and genitalia were prepped with Betadine solution he was draped in usual sterile fashion.  Cystoscopy was performed using 23 [place] scope and 30 degree lens.  Examination revealed a normal urethra.  The external sphincter was intact.  The prostatic urethra was approximately 3 cm in length with some lateral lobe enlargement of small middle lobe with obstruction.  The bladder had mild to moderate trabeculation with a couple cellules.  No mucosal lesions were seen.  Ureteral orifices were unremarkable.  The right ureteral orifice was cannulated with a 5 French opening catheter and contrast was instilled.  The right retrograde pyelogram revealed a normal caliber distal ureter with a filling defect in the upper distal/lower mid ureter consistent with the stone.  No additional filling defects were identified.  There was minimal dilation.  A sensor wire was then advanced the kidney through the opening catheter and the cystoscope was removed.  The 4.5 [place] single lumen semirigid ureteroscope was advanced per urethra into the bladder and then up the  ureter alongside the wire without difficulty.  The stone was visualized and then engaged with the holmium laser with a 365 m fiber set on 0.8 J and 10 Hz.  The stone was hard but fragmented and was broken into manageable fragments that were then removed with the engage basket.  Additional lasering and basket removal was performed as required until all of the stone fragments had been removed from the ureter.  Final endoscopic and fluoroscopic evaluation demonstrated no residual ureteral stone fragments.  Ureteroscope was removed and the cystoscope was reinserted over the wire.  A 6 French by 26 cm contour double-J stent with tether was advanced over the wire to the kidney under fluoroscopic guidance.  The wire was removed, leaving a good coil in the kidney and a good coil in the bladder.  The cystoscope was then removed leaving the stent string exiting the urethra.  The scope was reinserted alongside the string and the stone fragments were evacuated from the bladder.  The scope was removed after the bladder was drained.  The stent string was secured to the patient's penis.  He was taken down from lithotomy position, his anesthetic was reversed and he was moved recovery in stable condition.  There were no complications.  He will be given his stone fragments to bring the office for analysis.

## 2020-06-10 NOTE — Anesthesia Postprocedure Evaluation (Signed)
Anesthesia Post Note  Patient: Bradley Wong  Procedure(s) Performed: CYSTOSCOPY RIGHT RETROGRADE URETEROSCOPY/HOLMIUM LASER/STENT PLACEMENT (Right Ureter)     Patient location during evaluation: PACU Anesthesia Type: General Level of consciousness: awake and alert and oriented Pain management: pain level controlled Vital Signs Assessment: post-procedure vital signs reviewed and stable Respiratory status: spontaneous breathing, nonlabored ventilation and respiratory function stable Cardiovascular status: blood pressure returned to baseline Postop Assessment: no apparent nausea or vomiting Anesthetic complications: no   No complications documented.  Last Vitals:  Vitals:   06/10/20 1145 06/10/20 1200  BP: 136/84 121/72  Pulse: 87 82  Resp: 18 12  Temp: (!) 36.3 C   SpO2: 96% 98%    Last Pain:  Vitals:   06/10/20 1145  TempSrc:   PainSc: 0-No pain                 Brennan Bailey

## 2020-06-10 NOTE — Transfer of Care (Signed)
Immediate Anesthesia Transfer of Care Note  Patient: CARLISLE ENKE  Procedure(s) Performed: Procedure(s) (LRB): CYSTOSCOPY RIGHT RETROGRADE URETEROSCOPY/HOLMIUM LASER/STENT PLACEMENT (Right)  Patient Location: PACU  Anesthesia Type: General  Level of Consciousness: awake, oriented, sedated and patient cooperative  Airway & Oxygen Therapy: Patient Spontanous Breathing and Patient connected to face mask oxygen  Post-op Assessment: Report given to PACU RN and Post -op Vital signs reviewed and stable  Post vital signs: Reviewed and stable  Complications: No apparent anesthesia complications Last Vitals:  Vitals Value Taken Time  BP 136/84 06/10/20 1145  Temp 36.3 C 06/10/20 1145  Pulse 90 06/10/20 1147  Resp 22 06/10/20 1147  SpO2 97 % 06/10/20 1147  Vitals shown include unvalidated device data.  Last Pain:  Vitals:   06/10/20 0856  TempSrc: Oral  PainSc: 0-No pain      Patients Stated Pain Goal: 4 (89/16/94 5038)  Complications: No complications documented.

## 2020-06-11 ENCOUNTER — Encounter (HOSPITAL_BASED_OUTPATIENT_CLINIC_OR_DEPARTMENT_OTHER): Payer: Self-pay | Admitting: Urology

## 2020-06-24 DIAGNOSIS — N201 Calculus of ureter: Secondary | ICD-10-CM | POA: Diagnosis not present

## 2020-06-24 DIAGNOSIS — M4726 Other spondylosis with radiculopathy, lumbar region: Secondary | ICD-10-CM | POA: Diagnosis not present

## 2020-06-24 DIAGNOSIS — M5116 Intervertebral disc disorders with radiculopathy, lumbar region: Secondary | ICD-10-CM | POA: Diagnosis not present

## 2020-06-24 DIAGNOSIS — M48061 Spinal stenosis, lumbar region without neurogenic claudication: Secondary | ICD-10-CM | POA: Diagnosis not present

## 2020-07-07 ENCOUNTER — Encounter: Payer: Self-pay | Admitting: Cardiovascular Disease

## 2020-07-07 ENCOUNTER — Other Ambulatory Visit: Payer: Self-pay

## 2020-07-07 ENCOUNTER — Ambulatory Visit (INDEPENDENT_AMBULATORY_CARE_PROVIDER_SITE_OTHER): Payer: Medicare Other | Admitting: Cardiovascular Disease

## 2020-07-07 VITALS — BP 126/78 | HR 60 | Ht 74.0 in | Wt 207.2 lb

## 2020-07-07 DIAGNOSIS — I10 Essential (primary) hypertension: Secondary | ICD-10-CM

## 2020-07-07 DIAGNOSIS — E782 Mixed hyperlipidemia: Secondary | ICD-10-CM

## 2020-07-07 DIAGNOSIS — I2510 Atherosclerotic heart disease of native coronary artery without angina pectoris: Secondary | ICD-10-CM | POA: Diagnosis not present

## 2020-07-07 DIAGNOSIS — R0602 Shortness of breath: Secondary | ICD-10-CM | POA: Diagnosis not present

## 2020-07-07 NOTE — Patient Instructions (Signed)
Medication Instructions:  1) Start taking your Benicar at bedtime  *If you need a refill on your cardiac medications before your next appointment, please call your pharmacy*  Testing/Procedures: Your provider has requested that you have an echocardiogram. Echocardiography is a painless test that uses sound waves to create images of your heart. It provides your doctor with information about the size and shape of your heart and how well your heart's chambers and valves are working. This procedure takes approximately one hour. There are no restrictions for this procedure.  Follow-Up: You will be called to schedule your 1 year appointment.

## 2020-07-07 NOTE — Progress Notes (Signed)
Cardiology Office Note:    Date:  07/07/2020   ID:  CASHEL Wong, DOB 05-17-1946, MRN [id number]  PCP:  Dettinger, Fransisca Kaufmann, MD  [facility] Cardiologist:  Sherren Mocha, MD  [place] Electrophysiologist:  None   Referring MD: Dettinger, Fransisca Kaufmann, MD   Chief Complaint  Patient presents with  . Hypertension    History of Present Illness:    Bradley Wong is a 74 y.o. male with a hx of hypertension and nonobstructive coronary artery disease, presenting for follow-up evaluation.  The patient underwent a gated coronary CTA in 2019 demonstrating mild nonobstructive coronary artery disease.  He denies any symptoms of chest pain or pressure.  He is been having some problems with his blood pressure of late.  He has had some blood pressure spikes in the mornings with readings in the 150s and 160s.  After he takes his Benicar his blood pressure is within goal limits.  During the summer months when he worked hard he had symptomatic hypotension at times.  His blood pressure medicine was adjusted and his Benicar was reduced from 40 mg daily to 20 mg daily at that time.  Most of his daytime readings are in excellent range.  He is having problems with his low back and has upcoming evaluation for this.  He has a lot of difficulty with arthritis.  He also has had kidney stones and is undergoing treatment for this.  He has noticed some shortness of breath with walking and other physical activities.  He denies orthopnea, PND, or heart palpitations.  Past Medical History:  Diagnosis Date  . Arthritis   . Coronary artery disease cardiologist--- dr Burt Knack   per coronary CTA 01-15-2018, mild nonobstructive CAD;   nuclear stress test 08-05-2012 in epic,  showed normal perfusion w/ no ischemia, ef 69%  . History of cancer chemotherapy 2000   for thyroid cancer  . History of colon polyps   . History of kidney stones   . History of thyroid cancer 2000   status post total thyroidectomy and  completed chemo same year for papillary cancer ;   followed by Dr Raliegh Ip. Hairston (endocrinologist), notes in epic,  no recurrence   . HTN (hypertension)    followed by pcp and cardiology  . Hyperlipidemia   . Hypothyroidism, postsurgical 2000   endocrinologist--- Dr Raliegh Ip. Hairston  . Mixed hyperlipidemia    followed by dr Burt Knack  . Pulmonary nodule    chest CT 07-17-2019 in epic  . Right ureteral stone   . Type 2 diabetes mellitus ([place])    followed by pcp---  (06-07-2020 per pt does not check blood sugar)  . Wears glasses   . Wears partial dentures    upper    Past Surgical History:  Procedure Laterality Date  . CYSTOSCOPY WITH STENT PLACEMENT Left 09/20/2018   Procedure: CYSTOSCOPY WITH STENT PLACEMENT;  Surgeon: Bjorn Loser, MD;  Location: [facility];  Service: Urology;  Laterality: Left;  . CYSTOSCOPY/URETEROSCOPY/HOLMIUM LASER/STENT PLACEMENT Right 06/10/2020   Procedure: CYSTOSCOPY RIGHT RETROGRADE URETEROSCOPY/HOLMIUM LASER/STENT PLACEMENT;  Surgeon: Irine Seal, MD;  Location: [facility];  Service: Urology;  Laterality: Right;  . EXTRACORPOREAL SHOCK WAVE LITHOTRIPSY Right 05/27/2020   Procedure: RIGHT EXTRACORPOREAL SHOCK WAVE LITHOTRIPSY (ESWL);  Surgeon: Raynelle Bring, MD;  Location: [facility];  Service: Urology;  Laterality: Right;  . KNEE SURGERY Right 1979  . PROSTATE BIOPSY  01/2013  . TONSILLECTOMY  child  . TOTAL THYROIDECTOMY Bilateral 2000  Current Medications: Current Meds  Medication Sig  . aspirin EC 81 MG tablet Take 81 mg by mouth daily. Swallow whole.  . Cholecalciferol (VITAMIN D-3) 5000 UNITS TABS Take 5,000 Units by mouth daily.   Marland Kitchen levothyroxine (SYNTHROID) 125 MCG tablet Take 1 tablet (125 mcg total) by mouth daily before breakfast.  . metFORMIN (GLUCOPHAGE-XR) 500 MG 24 hr tablet 1 tablet daily  . olmesartan (BENICAR) 40 MG tablet Take 1 tablet (40 mg total) by mouth daily. (Patient taking differently: Take 20 mg by  mouth daily. )  . oxyCODONE-acetaminophen (PERCOCET) 5-325 MG tablet Take 1 tablet by mouth every 4 (four) hours as needed for severe pain.  . rosuvastatin (CRESTOR) 40 MG tablet Take 1 tablet (40 mg total) by mouth daily.  . tamsulosin (FLOMAX) 0.4 MG CAPS capsule Take 0.4 mg by mouth at bedtime.      Allergies:   Niaspan [niacin er] and Penicillins   Social History   Socioeconomic History  . Marital status: Married    Spouse name: [name]  . Number of children: 4  . Years of education: 48  . Highest education level: Associate degree: occupational, Hotel manager, or vocational program  Occupational History  . 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 Smoking status: Never Smoker  . Smokeless tobacco: Never Used  Vaping Use  . Vaping Use: Never used  Substance and Sexual Activity  . Alcohol use: Not Currently  . Drug use: Never  . Sexual activity: Not on file  Other Topics Concern  . Not on file  Social History Narrative  . Not on file   Social Determinants of Health   Financial Resource Strain:   . Difficulty of Paying Living Expenses: Not on file  Food Insecurity:   . Worried About Charity fundraiser in the Last Year: Not on file  . Ran Out of Food in the Last Year: Not on file  Transportation Needs:   . Lack of Transportation (Medical): Not on file  . Lack of Transportation (Non-Medical): Not on file  Physical Activity:   . Days of Exercise per Week: Not on file  . Minutes of Exercise per Session: Not on file  Stress:   . Feeling of Stress : Not on file  Social Connections:   . Frequency of Communication with Friends and Family: Not on file  . Frequency of Social Gatherings with Friends and Family: Not on file  . Attends Religious Services: Not on file  . Active Member of Clubs or Organizations: Not on file  . Attends Archivist Meetings: Not on file  . Marital Status: Not on file     Family  History: The patient's family history includes Cancer in his paternal grandfather; Cancer (age of onset: 45) in his father; Congestive Heart Failure in his mother; Coronary artery disease (age of onset: 39) in his mother; Diabetes in his brother, brother, and maternal grandmother; Heart disease in his brother and brother; Hypertension in his mother; Kidney failure in his brother and brother. There is no history of Colon cancer, Rectal cancer, or Stomach cancer.  ROS:   Please see the history of present illness.    All other systems reviewed and are negative.  EKGs/Labs/Other Studies Reviewed:    The following studies were reviewed today: Coronary CTA 01-15-2018: Aorta: Normal size. Trivial calcifications and atherosclerosis. No dissection.  Aortic Valve:  Trileaflet.  No calcifications.  Coronary Arteries:  Normal coronary origin.  Right dominance.  RCA is a medium size dominant artery that gives rise to PDA and PLVB. There is minimal plaque.  Left main is a short artery that bifurcates into LAD and LCX arteries. There is minimal ostial eccentric calcified plaque with associated stenosis 0-25%.  LAD is a large vessel that gives rise to one diagonal artery. There is mild calcified plaque in the proximal segment with associated stenosis 25-50%. Mid, distal LAD and diagonal artery have no significant stenosis.  LCX is a non-dominant artery that gives rise to one large OM1 branch. There is no plaque.  Other findings:  Normal pulmonary vein drainage into the left atrium.  Normal let atrial appendage without a thrombus.  Normal size of the pulmonary artery.  IMPRESSION: 1. Coronary calcium score of 35. This was 31 percentile for age and sex matched control.  2. Normal coronary origin with right dominance.  3. Mild non-obstructive CAD in the left main and proximal LAD.   EKG:  EKG is ordered today.  The ekg ordered today demonstrates normal sinus rhythm 60 bpm,  within normal limits.  Recent Labs: 05/19/2020: ALT 24; Platelets 143 06/10/2020: BUN 23; Creatinine, Ser 1.00; Hemoglobin 13.9; Potassium 4.4; Sodium 145  Recent Lipid Panel    Component Value Date/Time   CHOL 99 (L) 05/19/2020 1100   CHOL 94 04/30/2013 0810   TRIG 74 05/19/2020 1100   TRIG 62 04/18/2017 1050   TRIG 53 04/30/2013 0810   HDL 42 05/19/2020 1100   HDL 44 04/18/2017 1050   HDL 36 (L) 04/30/2013 0810   CHOLHDL 2.4 05/19/2020 1100   LDLCALC 41 05/19/2020 1100   LDLCALC 39 05/12/2014 0841   LDLCALC 47 04/30/2013 0810    Physical Exam:    VS:  BP 126/78   Pulse 60   Ht 6\' 2"  (1.88 m)   Wt 207 lb 3.2 oz (94 kg)   SpO2 97%   BMI 26.60 kg/m     Wt Readings from Last 3 Encounters:  07/07/20 207 lb 3.2 oz (94 kg)  06/10/20 203 lb (92.1 kg)  05/27/20 (!) 205 lb 1.6 oz (93 kg)     GEN:  Well nourished, well developed in no acute distress HEENT: Normal NECK: No JVD; No carotid bruits LYMPHATICS: No lymphadenopathy CARDIAC: RRR, no murmurs, rubs, gallops RESPIRATORY:  Clear to auscultation without rales, wheezing or rhonchi  ABDOMEN: Soft, non-tender, non-distended MUSCULOSKELETAL:  No edema; No deformity  SKIN: Warm and dry NEUROLOGIC:  Alert and oriented x 3 PSYCHIATRIC:  Normal affect   ASSESSMENT:    1. Shortness of breath   2. Essential hypertension   3. Mixed hyperlipidemia   4. Coronary artery disease involving native coronary artery of native heart without angina pectoris    PLAN:    In order of problems listed above:  1. We will check an echocardiogram.  No obvious signs of volume overload or cardiac abnormalities on his exam.  Lung fields are clear on exam with no history of smoking. 2. Blood pressure has been labile.  Morning readings are higher with good control of his blood pressure after he takes his morning medication.  We will give him a trial of changing his Benicar to nighttime dosing.  Otherwise no changes are made today.  Emphasized the  importance of adequate fluid hydration and avoiding working out in the hot temperatures. 3. Lipids are excellent with LDL cholesterol less than 55 mg/dL on rosuvastatin 40 mg daily. 4. Mild nonobstructive  disease based on CTA results from 2 years ago.  FFR evaluation was negative.  Continue secondary risk reduction measures with low-dose aspirin, and ARB, and a high intensity statin drug.  If he requires back surgery, he would be at low cardiovascular risk of complications.   Medication Adjustments/Labs and Tests Ordered: Current medicines are reviewed at length with the patient today.  Concerns regarding medicines are outlined above.  Orders Placed This Encounter  Procedures  . EKG 12-Lead  . ECHOCARDIOGRAM COMPLETE   No orders of the defined types were placed in this encounter.   Patient Instructions  Medication Instructions:  1) Start taking your Benicar at bedtime  *If you need a refill on your cardiac medications before your next appointment, please call your pharmacy*  Testing/Procedures: Your provider has requested that you have an echocardiogram. Echocardiography is a painless test that uses sound waves to create images of your heart. It provides your doctor with information about the size and shape of your heart and how well your heart's chambers and valves are working. This procedure takes approximately one hour. There are no restrictions for this procedure.  Follow-Up: You will be called to schedule your 1 year appointment.    Signed, Sherren Mocha, MD  07/07/2020 2:44 PM    [place]

## 2020-07-13 DIAGNOSIS — M48061 Spinal stenosis, lumbar region without neurogenic claudication: Secondary | ICD-10-CM | POA: Diagnosis not present

## 2020-07-13 DIAGNOSIS — I10 Essential (primary) hypertension: Secondary | ICD-10-CM | POA: Diagnosis not present

## 2020-07-13 DIAGNOSIS — Z6827 Body mass index (BMI) 27.0-27.9, adult: Secondary | ICD-10-CM | POA: Diagnosis not present

## 2020-07-13 DIAGNOSIS — N201 Calculus of ureter: Secondary | ICD-10-CM | POA: Diagnosis not present

## 2020-07-23 ENCOUNTER — Ambulatory Visit (HOSPITAL_COMMUNITY): Payer: Medicare Other | Attending: Cardiology

## 2020-07-23 ENCOUNTER — Other Ambulatory Visit: Payer: Self-pay

## 2020-07-23 DIAGNOSIS — R0602 Shortness of breath: Secondary | ICD-10-CM | POA: Insufficient documentation

## 2020-07-23 LAB — ECHOCARDIOGRAM COMPLETE
Area-P 1/2: 4.08 cm2
S' Lateral: 2.8 cm

## 2020-07-30 DIAGNOSIS — N201 Calculus of ureter: Secondary | ICD-10-CM | POA: Diagnosis not present

## 2020-08-05 DIAGNOSIS — M5136 Other intervertebral disc degeneration, lumbar region: Secondary | ICD-10-CM | POA: Diagnosis not present

## 2020-08-13 DIAGNOSIS — C73 Malignant neoplasm of thyroid gland: Secondary | ICD-10-CM | POA: Diagnosis not present

## 2020-08-13 DIAGNOSIS — E890 Postprocedural hypothyroidism: Secondary | ICD-10-CM | POA: Diagnosis not present

## 2020-08-16 DIAGNOSIS — Z6827 Body mass index (BMI) 27.0-27.9, adult: Secondary | ICD-10-CM | POA: Diagnosis not present

## 2020-08-16 DIAGNOSIS — M5416 Radiculopathy, lumbar region: Secondary | ICD-10-CM | POA: Diagnosis not present

## 2020-09-27 DIAGNOSIS — M48061 Spinal stenosis, lumbar region without neurogenic claudication: Secondary | ICD-10-CM | POA: Diagnosis not present

## 2020-09-27 DIAGNOSIS — I10 Essential (primary) hypertension: Secondary | ICD-10-CM | POA: Diagnosis not present

## 2020-09-27 DIAGNOSIS — Z6828 Body mass index (BMI) 28.0-28.9, adult: Secondary | ICD-10-CM | POA: Diagnosis not present

## 2020-10-01 ENCOUNTER — Telehealth: Payer: Self-pay | Admitting: *Deleted

## 2020-10-01 NOTE — Telephone Encounter (Signed)
Patient unavailable.  Contacted on 10/01/2020 at 1452.  Patient needs call back

## 2020-10-01 NOTE — Telephone Encounter (Signed)
   [facility] Pre-operative Risk Assessment    HEARTCARE STAFF: - Please ensure there is not already an duplicate clearance open for this procedure. - Under Visit Info/Reason for Call, type in Other and utilize the format Clearance MM/DD/YY or Clearance TBD. Do not use dashes or single digits. - If request is for dental extraction, please clarify the # of teeth to be extracted.  Request for surgical clearance:  1. What type of surgery is being performed? Lumbar laminectomy   2. When is this surgery scheduled? 11/09/2020   3. What type of clearance is required (medical clearance vs. Pharmacy clearance to hold med vs. Both)? Medical  4. Are there any medications that need to be held prior to surgery and how long?None listed but pt is on Asa 81 mg   5. Practice name and name of physician performing surgery? [facility], Dr Duffy Rhody    6. What is the office phone number? [phone]   7.   What is the office fax number? [place]  8.   Anesthesia type (None, local, MAC, general) ? General   Bradley Wong L 10/01/2020, 10:39 AM  _________________________________________________________________   (provider comments below)

## 2020-10-01 NOTE — Telephone Encounter (Signed)
Yes. Ok to hold ASA for surgery. thanks

## 2020-10-01 NOTE — Telephone Encounter (Signed)
Lucille Passy. Heitz 74 year old male is requesting lumbar laminectomy.  He was last seen in clinic by you on 07/07/2020.  During that time he denied symptoms of chest pain and pressure.  He did note some increased shortness of breath and elevated blood pressures in the mornings.  His blood pressures did normalize after taking his Benicar.  A follow-up echocardiogram was ordered and was normal.  His PMH includes HTN and nonobstructive CAD.  He underwent a gated coronary CTA in 2019 demonstrating mild nonobstructive coronary artery disease.  May his aspirin be held for his upcoming surgery?   Thank you for your help.  Please direct response to CV DIV preop for.  Jossie Ng. Devonn Giampietro NP-C    10/01/2020, 2:00 PM [place] [address] Office [phone] Fax [phone]

## 2020-10-14 NOTE — Telephone Encounter (Signed)
Left message to call back 10/14/2020 at 10:30 AM.  Patient needs call back.

## 2020-10-19 NOTE — Telephone Encounter (Signed)
   Primary Cardiologist: Sherren Mocha, MD  Chart reviewed as part of pre-operative protocol coverage. Given past medical history and time since last visit, based on ACC/AHA guidelines, Bradley Wong would be at acceptable risk for the planned procedure without further cardiovascular testing.   Patient was advised that if he develops new symptoms prior to surgery to contact our office to arrange a follow-up appointment.  He verbalized understanding.  He may hold his aspirin for 5-7 days prior to his surgery.  Please resume as soon as hemostasis is achieved.  I will route this recommendation to the requesting party via Epic fax function and remove from pre-op pool.  Please call with questions.  Jossie Ng. Dondrea Clendenin NP-C    10/19/2020, 7:19 AM [name] Office [phone] Fax [phone]

## 2020-11-09 DIAGNOSIS — G9741 Accidental puncture or laceration of dura during a procedure: Secondary | ICD-10-CM | POA: Diagnosis not present

## 2020-11-09 DIAGNOSIS — M5416 Radiculopathy, lumbar region: Secondary | ICD-10-CM | POA: Diagnosis not present

## 2020-11-09 DIAGNOSIS — M48061 Spinal stenosis, lumbar region without neurogenic claudication: Secondary | ICD-10-CM | POA: Diagnosis not present

## 2020-11-09 DIAGNOSIS — M4186 Other forms of scoliosis, lumbar region: Secondary | ICD-10-CM | POA: Diagnosis not present

## 2020-11-19 ENCOUNTER — Ambulatory Visit: Payer: Medicare Other | Admitting: Family Medicine

## 2020-11-29 DIAGNOSIS — R030 Elevated blood-pressure reading, without diagnosis of hypertension: Secondary | ICD-10-CM | POA: Insufficient documentation

## 2020-12-13 ENCOUNTER — Ambulatory Visit: Payer: Medicare Other | Admitting: Family Medicine

## 2021-01-28 ENCOUNTER — Other Ambulatory Visit: Payer: Self-pay

## 2021-01-28 ENCOUNTER — Encounter: Payer: Self-pay | Admitting: Family Medicine

## 2021-01-28 ENCOUNTER — Ambulatory Visit (INDEPENDENT_AMBULATORY_CARE_PROVIDER_SITE_OTHER): Payer: Medicare Other | Admitting: Family Medicine

## 2021-01-28 VITALS — BP 92/63 | HR 69 | Ht 74.0 in | Wt 192.0 lb

## 2021-01-28 DIAGNOSIS — E118 Type 2 diabetes mellitus with unspecified complications: Secondary | ICD-10-CM | POA: Diagnosis not present

## 2021-01-28 DIAGNOSIS — I10 Essential (primary) hypertension: Secondary | ICD-10-CM

## 2021-01-28 DIAGNOSIS — E890 Postprocedural hypothyroidism: Secondary | ICD-10-CM

## 2021-01-28 DIAGNOSIS — E7800 Pure hypercholesterolemia, unspecified: Secondary | ICD-10-CM

## 2021-01-28 DIAGNOSIS — R972 Elevated prostate specific antigen [PSA]: Secondary | ICD-10-CM | POA: Diagnosis not present

## 2021-01-28 DIAGNOSIS — R7303 Prediabetes: Secondary | ICD-10-CM

## 2021-01-28 LAB — BAYER DCA HB A1C WAIVED: HB A1C (BAYER DCA - WAIVED): 5.7 % (ref <7.0)

## 2021-01-28 MED ORDER — OLMESARTAN MEDOXOMIL 5 MG PO TABS: 5.0000 mg | ORAL_TABLET | Freq: Every day | ORAL | 3 refills | Status: DC

## 2021-01-28 NOTE — Progress Notes (Signed)
BP 92/63   Pulse 69   Ht '6\' 2"'  (1.88 m)   Wt 192 lb (87.1 kg)   SpO2 97%   BMI 24.65 kg/m    Subjective:   Patient ID: Bradley Wong, male    DOB: 11/12/1945, 75 y.o.   MRN: [id number]  HPI: Bradley Wong is a 75 y.o. male presenting on 01/28/2021 for Medical Management of Chronic Issues and Diabetes   HPI Type 2 diabetes mellitus Patient comes in today for recheck of his diabetes. Patient has been currently taking Metformin, A1c today is 5.7. Patient is currently on an ACE inhibitor/ARB. Patient has not seen an ophthalmologist this year. Patient denies any issues with their feet. The symptom started onset as an adult hypertension hyperlipidemia hypothyroidism ARE RELATED TO DM   Hypertension Patient is currently on Benicar, and their blood pressure today is 92/63. Patient is having some occasional lightheadedness or dizziness. Patient denies headaches, blurred vision, chest pains, shortness of breath, or weakness. Denies any side effects from medication and is content with current medication.   Hyperlipidemia Patient is coming in for recheck of his hyperlipidemia. The patient is currently taking Crestor. They deny any issues with myalgias or history of liver damage from it. They deny any focal numbness or weakness or chest pain.   Patient has elevated PSA and sees Dr. Jeffie Wong for that.  He takes Flomax  Hypothyroidism recheck with history of thyroid cancer Patient is coming in for thyroid recheck today as well. They deny any issues with hair changes or heat or cold problems or diarrhea or constipation. They deny any chest pain or palpitations. They are currently on levothyroxine 125 micrograms   Relevant past medical, surgical, family and social history reviewed and updated as indicated. Interim medical history since our last visit reviewed. Allergies and medications reviewed and updated.  Review of Systems  Constitutional: Negative for chills and fever.  Eyes: Negative for  visual disturbance.  Respiratory: Negative for shortness of breath and wheezing.   Cardiovascular: Negative for chest pain and leg swelling.  Musculoskeletal: Negative for back pain and gait problem.  Skin: Negative for rash.  Neurological: Positive for dizziness and light-headedness. Negative for weakness and numbness.  All other systems reviewed and are negative.   Per HPI unless specifically indicated above   Allergies as of 01/28/2021      Reactions   Niaspan [niacin Er] Other (See Comments)   flushing   Penicillins Rash   Has patient had a PCN reaction causing immediate rash, facial/tongue/throat swelling, SOB or lightheadedness with hypotension: No Has patient had a PCN reaction causing severe rash involving mucus membranes or skin necrosis: No Has patient had a PCN reaction that required hospitalization: Unknown Has patient had a PCN reaction occurring within the last 10 years: No If all of the above answers are "NO", then may proceed with Cephalosporin use.      Medication List       Accurate as of January 28, 2021  9:39 AM. If you have any questions, ask your nurse or doctor.        aspirin EC 81 MG tablet Take 81 mg by mouth daily. Swallow whole.   levothyroxine 125 MCG tablet Commonly known as: SYNTHROID Take 1 tablet (125 mcg total) by mouth daily before breakfast.   metFORMIN 500 MG 24 hr tablet Commonly known as: GLUCOPHAGE-XR 1 tablet daily   olmesartan 40 MG tablet Commonly known as: BENICAR Take 1 tablet (40 mg total) by  mouth daily. What changed: how much to take   oxyCODONE-acetaminophen 5-325 MG tablet Commonly known as: Percocet Take 1 tablet by mouth every 4 (four) hours as needed for severe pain.   rosuvastatin 40 MG tablet Commonly known as: CRESTOR Take 1 tablet (40 mg total) by mouth daily.   tamsulosin 0.4 MG Caps capsule Commonly known as: FLOMAX Take 0.4 mg by mouth at bedtime.   Vitamin D-3 125 MCG (5000 UT) Tabs Take 5,000 Units by  mouth daily.        Objective:   BP 92/63   Pulse 69   Ht '6\' 2"'  (1.88 m)   Wt 192 lb (87.1 kg)   SpO2 97%   BMI 24.65 kg/m   Wt Readings from Last 3 Encounters:  01/28/21 192 lb (87.1 kg)  07/07/20 207 lb 3.2 oz (94 kg)  06/10/20 203 lb (92.1 kg)    Physical Exam Vitals and nursing note reviewed.  Constitutional:      General: He is not in acute distress.    Appearance: He is well-developed. He is not diaphoretic.  Eyes:     General: No scleral icterus.    Conjunctiva/sclera: Conjunctivae normal.  Neck:     Thyroid: No thyromegaly.  Cardiovascular:     Rate and Rhythm: Normal rate and regular rhythm.     Heart sounds: Normal heart sounds. No murmur heard.   Pulmonary:     Effort: Pulmonary effort is normal. No respiratory distress.     Breath sounds: Normal breath sounds. No wheezing.  Musculoskeletal:        General: No swelling. Normal range of motion.     Cervical back: Neck supple.  Lymphadenopathy:     Cervical: No cervical adenopathy.  Skin:    General: Skin is warm and dry.     Findings: No rash.  Neurological:     Mental Status: He is alert and oriented to person, place, and time.     Coordination: Coordination normal.  Psychiatric:        Behavior: Behavior normal.       Assessment & Plan:   Problem List Items Addressed This Visit      Cardiovascular and Mediastinum   Essential hypertension, benign (Chronic)   Relevant Orders   CBC with Differential/Platelet   CMP14+EGFR   Lipid panel   TSH   Bayer DCA Hb A1c Waived     Endocrine   Hypothyroidism, postsurgical   Relevant Orders   CBC with Differential/Platelet   CMP14+EGFR   Lipid panel   TSH   Bayer DCA Hb A1c Waived    Other Visit Diagnoses    Controlled type 2 diabetes mellitus with complication, without long-term current use of insulin ([name])    -  Primary   Relevant Orders   CBC with Differential/Platelet   CMP14+EGFR   Lipid panel   TSH   Bayer DCA Hb A1c Waived       A1c is 5.7 and is lost weight and looking good.  He does get a little dizzy when he is blood pressure being down so much, will back off on his Benicar down to 5 mg daily.   Follow up plan: Return in about 6 months (around 07/30/2021) for Prediabetes and hypertension cholesterol.  Counseling provided for all of the vaccine components Orders Placed This Encounter  Procedures  . CBC with Differential/Platelet  . CMP14+EGFR  . Lipid panel  . TSH  . Bayer DCA Hb A1c Waived  Caryl Pina, MD [facility] 01/28/2021, 9:39 AM

## 2021-01-29 LAB — CMP14+EGFR
ALT: 22 IU/L (ref 0–44)
AST: 19 IU/L (ref 0–40)
Albumin/Globulin Ratio: 3 — ABNORMAL HIGH (ref 1.2–2.2)
Albumin: 4.5 g/dL (ref 3.7–4.7)
Alkaline Phosphatase: 57 IU/L (ref 44–121)
BUN/Creatinine Ratio: 27 — ABNORMAL HIGH (ref 10–24)
BUN: 33 mg/dL — ABNORMAL HIGH (ref 8–27)
Bilirubin Total: 0.7 mg/dL (ref 0.0–1.2)
CO2: 20 mmol/L (ref 20–29)
Calcium: 9.6 mg/dL (ref 8.6–10.2)
Chloride: 105 mmol/L (ref 96–106)
Creatinine, Ser: 1.22 mg/dL (ref 0.76–1.27)
Globulin, Total: 1.5 g/dL (ref 1.5–4.5)
Glucose: 107 mg/dL — ABNORMAL HIGH (ref 65–99)
Potassium: 4.8 mmol/L (ref 3.5–5.2)
Sodium: 140 mmol/L (ref 134–144)
Total Protein: 6 g/dL (ref 6.0–8.5)
eGFR: 63 mL/min/{1.73_m2} (ref >59)

## 2021-01-29 LAB — LIPID PANEL
Chol/HDL Ratio: 2.1 ratio (ref 0.0–5.0)
Cholesterol, Total: 88 mg/dL — ABNORMAL LOW (ref 100–199)
HDL: 42 mg/dL (ref >39)
LDL Chol Calc (NIH): 32 mg/dL (ref 0–99)
Triglycerides: 57 mg/dL (ref 0–149)
VLDL Cholesterol Cal: 14 mg/dL (ref 5–40)

## 2021-01-29 LAB — CBC WITH DIFFERENTIAL/PLATELET
Basophils Absolute: 0 10*3/uL (ref 0.0–0.2)
Basos: 1 %
EOS (ABSOLUTE): 0.1 10*3/uL (ref 0.0–0.4)
Eos: 3 %
Hematocrit: 41.3 % (ref 37.5–51.0)
Hemoglobin: 14.2 g/dL (ref 13.0–17.7)
Immature Grans (Abs): 0 10*3/uL (ref 0.0–0.1)
Immature Granulocytes: 0 %
Lymphocytes Absolute: 1.3 10*3/uL (ref 0.7–3.1)
Lymphs: 30 %
MCH: 33.3 pg — ABNORMAL HIGH (ref 26.6–33.0)
MCHC: 34.4 g/dL (ref 31.5–35.7)
MCV: 97 fL (ref 79–97)
Monocytes Absolute: 0.5 10*3/uL (ref 0.1–0.9)
Monocytes: 11 %
Neutrophils Absolute: 2.4 10*3/uL (ref 1.4–7.0)
Neutrophils: 55 %
Platelets: 156 10*3/uL (ref 150–450)
RBC: 4.26 x10E6/uL (ref 4.14–5.80)
RDW: 13 % (ref 11.6–15.4)
WBC: 4.3 10*3/uL (ref 3.4–10.8)

## 2021-01-29 LAB — PSA, TOTAL AND FREE
PSA, Free Pct: 18.8 %
PSA, Free: 0.77 ng/mL
Prostate Specific Ag, Serum: 4.1 ng/mL — ABNORMAL HIGH (ref 0.0–4.0)

## 2021-01-29 LAB — TSH: TSH: 0.129 u[IU]/mL — ABNORMAL LOW (ref 0.450–4.500)

## 2021-02-25 DIAGNOSIS — M5416 Radiculopathy, lumbar region: Secondary | ICD-10-CM | POA: Insufficient documentation

## 2021-03-02 DIAGNOSIS — N200 Calculus of kidney: Secondary | ICD-10-CM | POA: Diagnosis not present

## 2021-03-02 DIAGNOSIS — R972 Elevated prostate specific antigen [PSA]: Secondary | ICD-10-CM | POA: Diagnosis not present

## 2021-03-02 DIAGNOSIS — N5201 Erectile dysfunction due to arterial insufficiency: Secondary | ICD-10-CM | POA: Diagnosis not present

## 2021-03-02 DIAGNOSIS — N401 Enlarged prostate with lower urinary tract symptoms: Secondary | ICD-10-CM | POA: Diagnosis not present

## 2021-03-02 DIAGNOSIS — R351 Nocturia: Secondary | ICD-10-CM | POA: Diagnosis not present

## 2021-03-09 IMAGING — CT CT CHEST W/O CM
2 of 7 series · 15 of 36 positions shown, 19 images · non-contrast
Comparison: 01/15/2018

CLINICAL DATA: Follow-up pulmonary nodule.

EXAM:
CT CHEST WITHOUT CONTRAST
TECHNIQUE: Multidetector CT imaging of the chest was performed following the
standard protocol without IV contrast.

[Series 2: thorax · axial · 0.71mm/px · z∈[-351,-73]mm · 14 of 157 slices shown, 18 images]
[im 9/157  mediastinal]
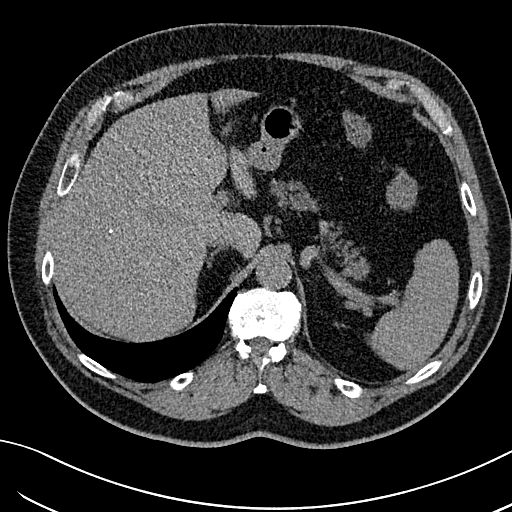
[im 9/157  lung]
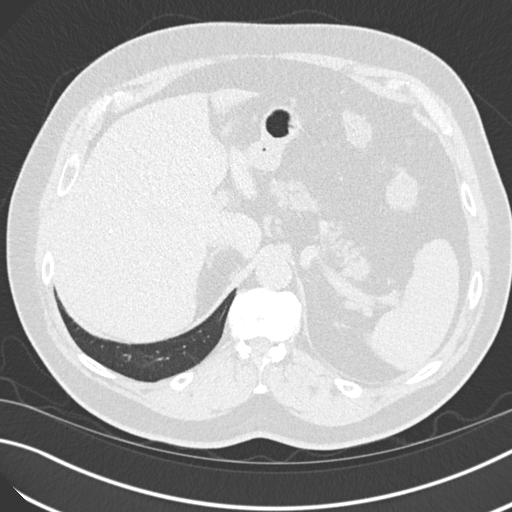
[im 18/157  lung]
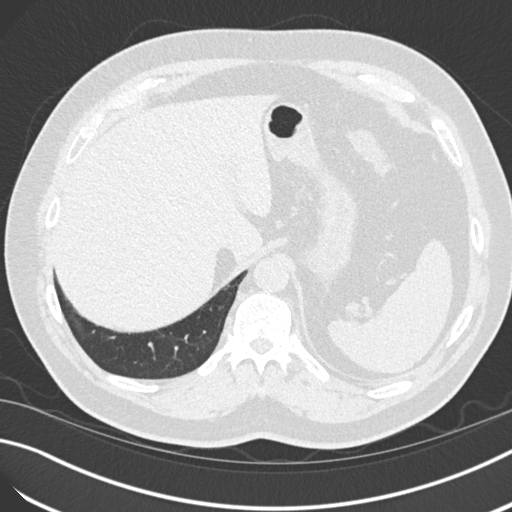
[im 35/157  lung]
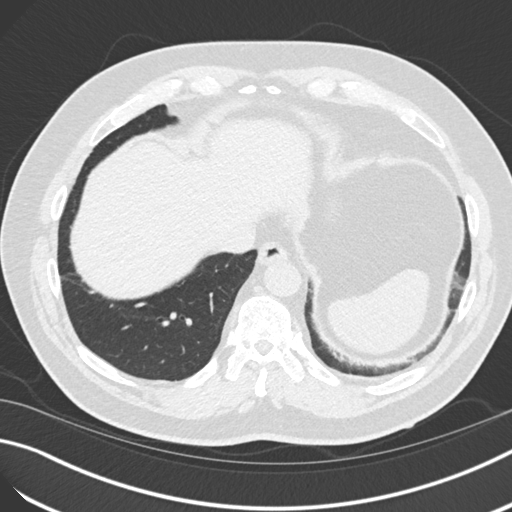
[im 44/157  lung]
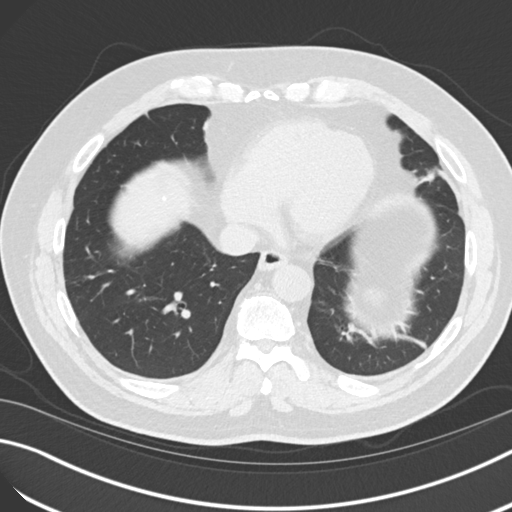
[im 53/157  mediastinal]
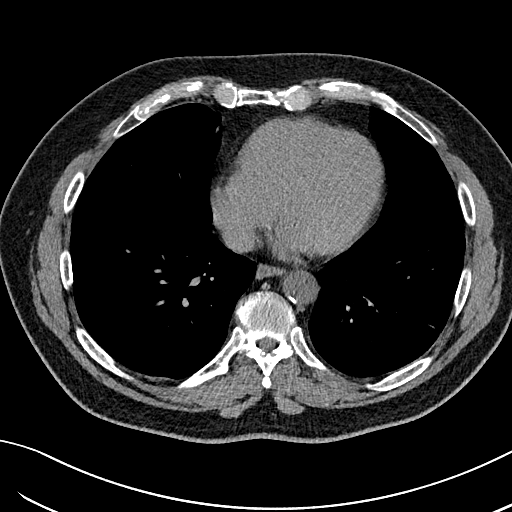
[im 53/157  lung]
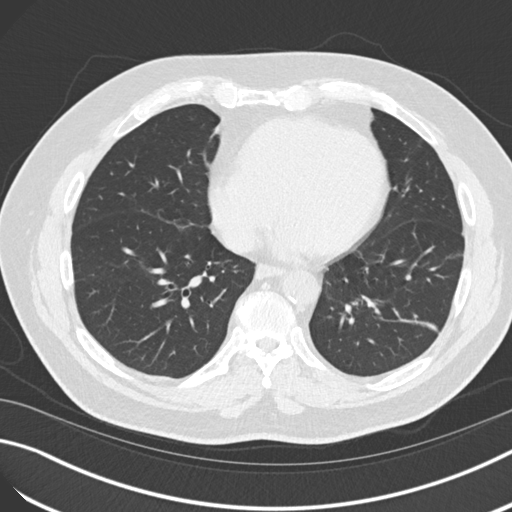
[im 61/157  lung]
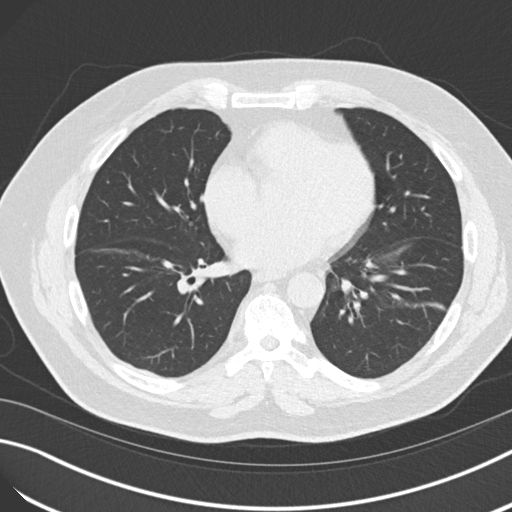
[im 70/157  lung]
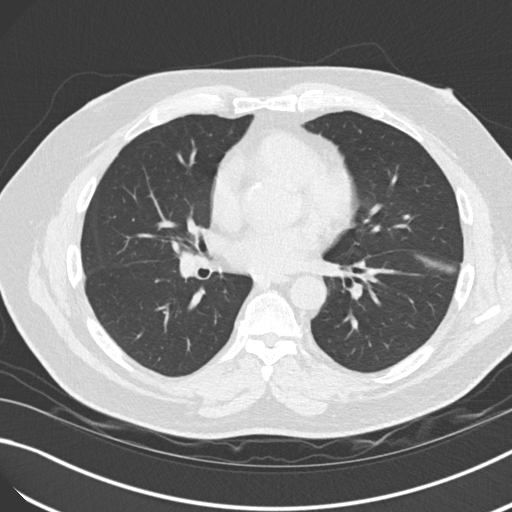
[im 87/157  lung]
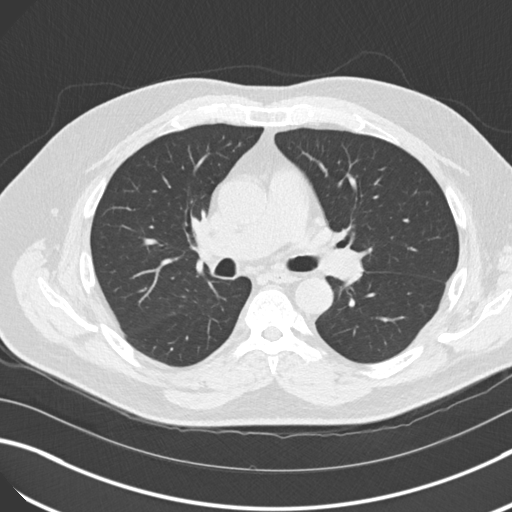
[im 96/157  mediastinal]
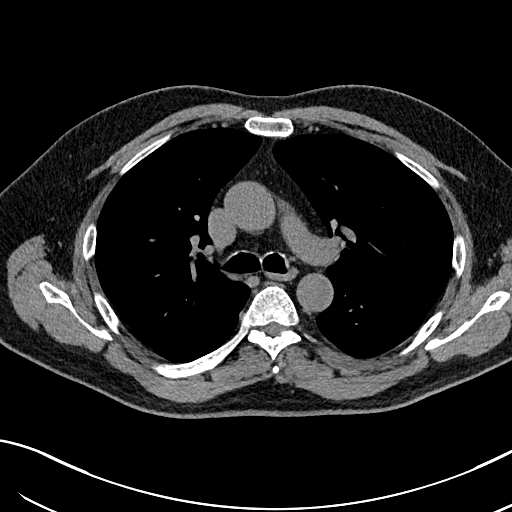
[im 96/157  lung]
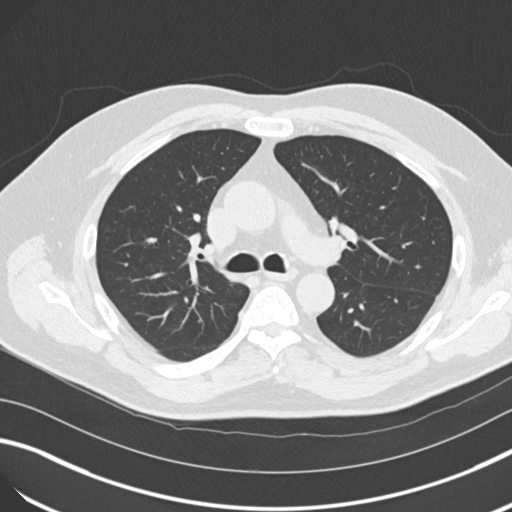
[im 105/157  lung]
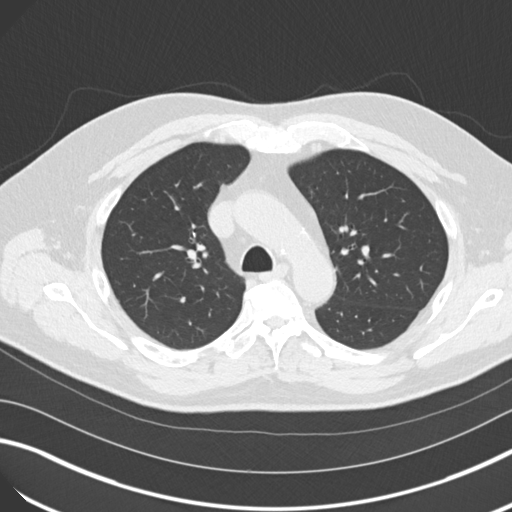
[im 113/157  lung]
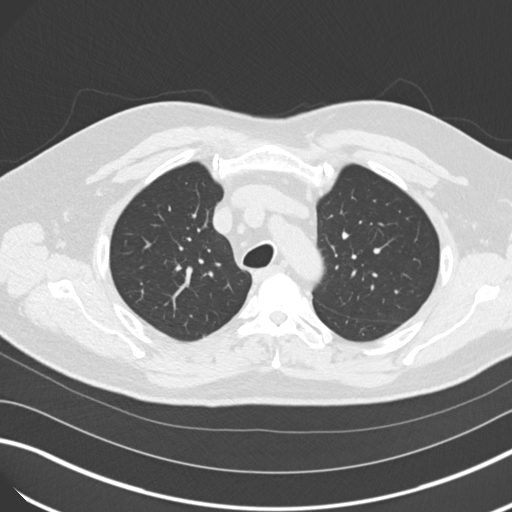
[im 122/157  lung]
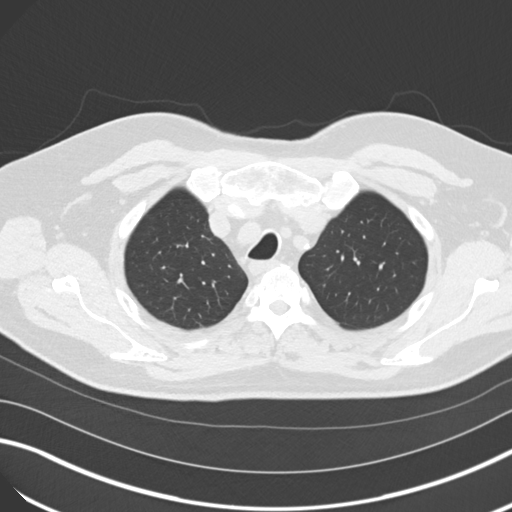
[im 139/157  mediastinal]
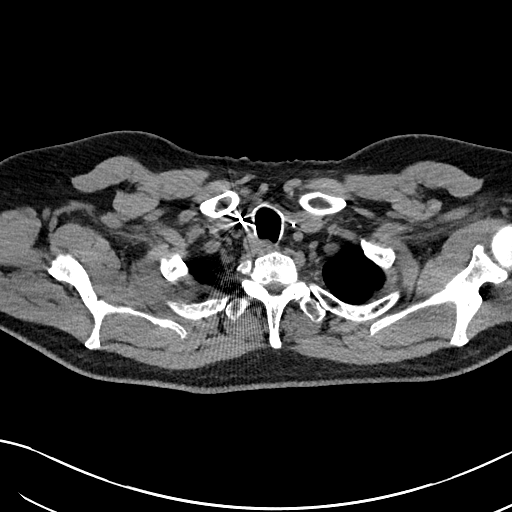
[im 139/157  lung]
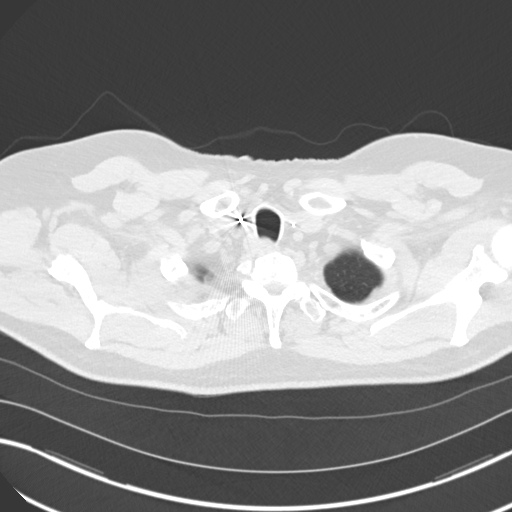
[im 148/157  lung]
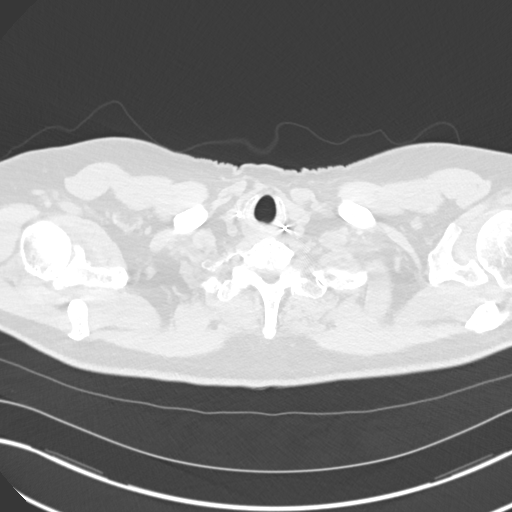

[Series 8: coronal · coronal · 0.61mm/px · 1 of 119 slices shown]
[im 60/119  lung]
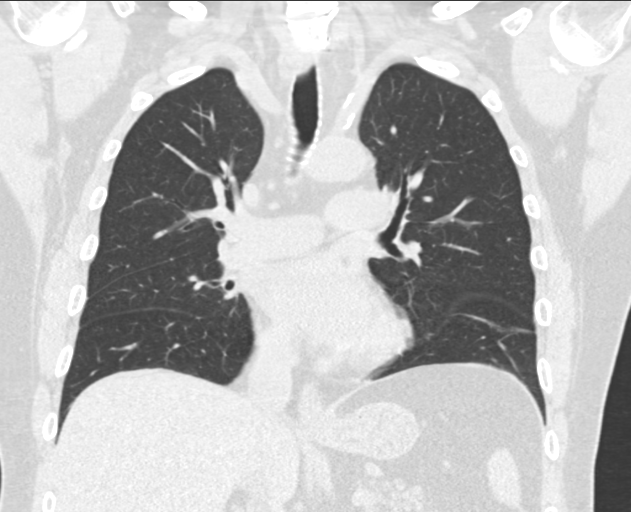

[15 of 36 positions shown; findings below may reference images not displayed]

FINDINGS: Cardiovascular: Heart size normal. Aortic atherosclerosis. No
pericardial effusion. Left main, lad coronary artery atherosclerotic
calcifications.

Mediastinum/Nodes: Thyroidectomy. No enlarged mediastinal or
axillary lymph nodes. Trachea and esophagus demonstrate no
significant findings.

Lungs/Pleura: No pleural effusion or pneumothorax. Scarring in the
lingula and left lower lobe unchanged. In the right lower lobe there
is a 6 x 3 mm nodule (5 mm mean diameter) on image 71 of series 8.
Unchanged from previous exam. Stable 3 mm subpleural nodule in the
right middle lobe, image 112/3. No new nodules.

Upper Abdomen: No acute abnormality. Calcified granuloma noted
within the dome.

Musculoskeletal: No chest wall mass or suspicious bone lesions
identified.
IMPRESSION: 1. Stable right middle lobe and right lower lobe pulmonary nodules
compared with 01/15/18. Findings compatible with a benign process. No
further follow-up of these nodules are indicated at this time.

2.  Aortic Atherosclerosis ([id number].D).

3.  Coronary artery atherosclerotic calcifications.

## 2021-04-08 ENCOUNTER — Ambulatory Visit (INDEPENDENT_AMBULATORY_CARE_PROVIDER_SITE_OTHER): Payer: Medicare Other

## 2021-04-08 VITALS — Ht 74.0 in | Wt 188.0 lb

## 2021-04-08 DIAGNOSIS — Z0000 Encounter for general adult medical examination without abnormal findings: Secondary | ICD-10-CM | POA: Diagnosis not present

## 2021-04-08 NOTE — Progress Notes (Signed)
Subjective:   Bradley Wong is a 75 y.o. male who presents for Medicare Annual/Subsequent preventive examination.  Virtual Visit via Telephone Note  I connected with  [name] on 04/08/21 at  9:45 AM EDT by telephone and verified that I am speaking with the correct person using two identifiers.  Location: Patient: Home Provider: [facility] Persons participating in the virtual visit: patient/Nurse Health Advisor   I discussed the limitations, risks, security and privacy concerns of performing an evaluation and management service by telephone and the availability of in person appointments. The patient expressed understanding and agreed to proceed.  Interactive audio and video telecommunications were attempted between this nurse and patient, however failed, due to patient having technical difficulties OR patient did not have access to video capability.  We continued and completed visit with audio only.  Some vital signs may be absent or patient reported.   Abdelaziz Westenberger E Tulsi Crossett, LPN   Review of Systems     Cardiac Risk Factors include: advanced age (>51men, >55 women);dyslipidemia;hypertension;male gender     Objective:    Today's Vitals   04/08/21 0958  Weight: 188 lb (85.3 kg)  Height: 6\' 2"  (1.88 m)   Body mass index is 24.14 kg/m.  Advanced Directives 04/08/2021 06/10/2020 04/07/2020 04/07/2019 09/30/2018 09/20/2018 09/16/2018  Does Patient Have a Medical Advance Directive? No No Yes No Yes Yes No  Type of Advance Directive - - Living will;Healthcare Power of [place];Living will [place];Living will -  Does patient want to make changes to medical advance directive? - - - - No - Patient declined - -  Copy of [place] in Chart? - - - - No - copy requested No - copy requested -  Would patient like information on creating a medical advance directive? Yes (MAU/Ambulatory/Procedural Areas - Information given) No -  Patient declined - Yes (MAU/Ambulatory/Procedural Areas - Information given) - - -    Current Medications (verified) Outpatient Encounter Medications as of 04/08/2021  Medication Sig   aspirin EC 81 MG tablet Take 81 mg by mouth daily. Swallow whole.   Cholecalciferol (VITAMIN D-3) 5000 UNITS TABS Take 5,000 Units by mouth daily.    HYDROcodone-acetaminophen (NORCO) 10-325 MG tablet Take by mouth.   levothyroxine (SYNTHROID) 125 MCG tablet Take 1 tablet (125 mcg total) by mouth daily before breakfast.   olmesartan (BENICAR) 5 MG tablet Take 1 tablet (5 mg total) by mouth daily.   rosuvastatin (CRESTOR) 40 MG tablet Take 1 tablet (40 mg total) by mouth daily.   tamsulosin (FLOMAX) 0.4 MG CAPS capsule Take 0.4 mg by mouth at bedtime.    oxyCODONE-acetaminophen (PERCOCET) 5-325 MG tablet Take 1 tablet by mouth every 4 (four) hours as needed for severe pain. (Patient not taking: Reported on 04/08/2021)   [DISCONTINUED] levothyroxine (SYNTHROID) 125 MCG tablet Take by mouth.   No facility-administered encounter medications on file as of 04/08/2021.    Allergies (verified) Niaspan [niacin er] and Penicillins   History: Past Medical History:  Diagnosis Date   Arthritis    Coronary artery disease cardiologist--- dr Burt Knack   per coronary CTA 01-15-2018, mild nonobstructive CAD;   nuclear stress test 08-05-2012 in epic,  showed normal perfusion w/ no ischemia, ef 69%   History of cancer chemotherapy 2000   for thyroid cancer   History of colon polyps    History of kidney stones    History of thyroid cancer 2000   status  post total thyroidectomy and completed chemo same year for papillary cancer ;   followed by Dr Raliegh Ip. Hairston (endocrinologist), notes in epic,  no recurrence    HTN (hypertension)    followed by pcp and cardiology   Hyperlipidemia    Hypothyroidism, postsurgical 2000   endocrinologist--- Dr Raliegh Ip. Hairston   Mixed hyperlipidemia    followed by dr cooper   Pulmonary nodule     chest CT 07-17-2019 in epic   Right ureteral stone    Type 2 diabetes mellitus ([place])    followed by pcp---  (06-07-2020 per pt does not check blood sugar)   Wears glasses    Wears partial dentures    upper   Past Surgical History:  Procedure Laterality Date   CYSTOSCOPY WITH STENT PLACEMENT Left 09/20/2018   Procedure: CYSTOSCOPY WITH STENT PLACEMENT;  Surgeon: Bjorn Loser, MD;  Location: [facility];  Service: Urology;  Laterality: Left;   CYSTOSCOPY/URETEROSCOPY/HOLMIUM LASER/STENT PLACEMENT Right 06/10/2020   Procedure: CYSTOSCOPY RIGHT RETROGRADE URETEROSCOPY/HOLMIUM LASER/STENT PLACEMENT;  Surgeon: Irine Seal, MD;  Location: [facility];  Service: Urology;  Laterality: Right;   EXTRACORPOREAL SHOCK WAVE LITHOTRIPSY Right 05/27/2020   Procedure: RIGHT EXTRACORPOREAL SHOCK WAVE LITHOTRIPSY (ESWL);  Surgeon: Raynelle Bring, MD;  Location: [facility];  Service: Urology;  Laterality: Right;   KNEE SURGERY Right 1979   PROSTATE BIOPSY  01/2013   TONSILLECTOMY  child   TOTAL THYROIDECTOMY Bilateral 2000   Family History  Problem Relation Age of Onset   Coronary artery disease Mother 1   Hypertension Mother    Congestive Heart Failure Mother    Cancer Father 33       throat and bone cancer   Diabetes Brother        deceased   Heart disease Brother    Kidney failure Brother    Diabetes Maternal Grandmother    Cancer Paternal Grandfather        leukemia   Diabetes Brother    Heart disease Brother    Kidney failure Brother    Colon cancer Neg Hx    Rectal cancer Neg Hx    Stomach cancer Neg Hx    Social History   Socioeconomic History   Marital status: Married    Spouse name: Bradley Wong   Number of children: 4   Years of education: 14   Highest education level: Associate degree: occupational, Hotel manager, or vocational program  Occupational History   Occupation: retired    Fish farm manager: Engineer, [employer]    Comment: Dealer work  Tobacco Use    Smoking status: Never   Smokeless tobacco: Never  Scientific laboratory technician Use: Never used  Substance and Sexual Activity   Alcohol use: Not Currently   Drug use: Never   Sexual activity: Not on file  Other Topics Concern   Not on file  Social History Narrative   Not on file   Social Determinants of Health   Financial Resource Strain: Low Risk    Difficulty of Paying Living Expenses: Not hard at all  Food Insecurity: No Food Insecurity   Worried About Charity fundraiser in the Last Year: Never true   Arboriculturist in the Last Year: Never true  Transportation Needs: No Transportation Needs   Lack of Transportation (Medical): No   Lack of Transportation (Non-Medical): No  Physical Activity: Sufficiently Active   Days of Exercise per Week: 7 days   Minutes of Exercise per Session: 30  min  Stress: No Stress Concern Present   Feeling of Stress : Not at all  Social Connections: Socially Integrated   Frequency of Communication with Friends and Family: More than three times a week   Frequency of Social Gatherings with Friends and Family: More than three times a week   Attends Religious Services: More than 4 times per year   Active Member of [employer] or Organizations: Yes   Attends Music therapist: More than 4 times per year   Marital Status: Married    Tobacco Counseling Counseling given: Not Answered   Clinical Intake:  Pre-visit preparation completed: Yes  Pain : No/denies pain     BMI - recorded: 24.14 Nutritional Status: BMI of 19-24  Normal Nutritional Risks: None Diabetes: No  How often do you need to have someone help you when you read instructions, pamphlets, or other written materials from your doctor or pharmacy?: 1 - Never  Diabetic? no  Interpreter Needed?: No  Information entered by :: Antonio Woodhams, LPN   Activities of Daily Living In your present state of health, do you have any difficulty performing the following activities: 04/08/2021  06/10/2020  Hearing? N N  Vision? N N  Difficulty concentrating or making decisions? N N  Walking or climbing stairs? N N  Dressing or bathing? N N  Doing errands, shopping? N -  Preparing Food and eating ? N -  Using the Toilet? N -  In the past six months, have you accidently leaked urine? N -  Do you have problems with loss of bowel control? N -  Managing your Medications? N -  Managing your Finances? N -  Housekeeping or managing your Housekeeping? N -  Some recent data might be hidden    Patient Care Team: Dettinger, Fransisca Kaufmann, MD as PCP - General (Family Medicine) Sherren Mocha, MD as PCP - Cardiology (Cardiology) Kristian Covey, MD (Internal Medicine) Irine Seal, MD (Urology) Bjorn Loser, MD as Consulting Physician (Urology) Vallarie Mare, MD as Consulting Physician (Neurosurgery) Harlen Labs, MD as Referring Physician (Optometry)  Indicate any recent Medical Services you may have received from other than [facility] providers in the past year (date may be approximate).     Assessment:   This is a routine wellness examination for [facility].  Hearing/Vision screen Hearing Screening - Comments:: Denies hearing difficulties  Vision Screening - Comments:: Wears eyeglasses - up to date with annual eye exam with Dr Marin Comment in [place]  Dietary issues and exercise activities discussed: Current Exercise Habits: Home exercise routine, Type of exercise: walking (working on house, yard, remodeling/repairing), Time (Minutes): 30, Frequency (Times/Week): 7, Weekly Exercise (Minutes/Week): 210, Intensity: Moderate, Exercise limited by: orthopedic condition(s)   Goals Addressed               This Visit's Progress     COMPLETED: Prevent falls        Stay active "finish pool house"       Prevent falls   On track     Work to complete shop safely while preventing accidents and falls.       COMPLETED: Recover From Covid (pt-stated)        Current Barriers:  Chronic  Disease Management support and education needs related to Covid-19 infection in patient with HTN, DM, post-surgical hypothyroidism due to thyroid cancer  Nurse Case Manager Clinical Goal(s):  Over the next 14 days, patient will not experience any worsening Covid-19 infection symptoms Over the next 14  days, patient will continue to quarantine due to Covid-19 infection  Interventions:  Discussed positive Covid-19 test results that he received today. Wife is positive as well.  Discussed HPI Overall feels like the flu (bodyaches, cough, etc) Initially had a fever but that has improved Discussed quarantining and treatment Objectively the patient did not seem to be in any acute distress and did not cough or have any labored breathing during our phone conversation Provided with RN CCM contact information and encouraged to reach out as needed  Patient Self Care Activities:  Performs ADL's independently Performs IADL's independently  Initial goal documentation         Depression Screen PHQ 2/9 Scores 04/08/2021 01/28/2021 05/19/2020 04/07/2020 06/13/2019 04/07/2019 12/04/2018  PHQ - 2 Score 0 0 0 0 0 0 0    Fall Risk Fall Risk  04/08/2021 01/28/2021 05/19/2020 05/19/2020 04/07/2020  Falls in the past year? 0 0 0 0 0  Number falls in past yr: 0 - - - -  Injury with Fall? 0 - - - -  Risk for fall due to : Orthopedic patient;Impaired vision;Medication side effect - - - -  Follow up Education provided;Falls prevention discussed - - - -    FALL RISK PREVENTION PERTAINING TO THE HOME:  Any stairs in or around the home? No  If so, are there any without handrails? No  Home free of loose throw rugs in walkways, pet beds, electrical cords, etc? Yes  Adequate lighting in your home to reduce risk of falls? Yes   ASSISTIVE DEVICES UTILIZED TO PREVENT FALLS:  Life alert? No  Use of a cane, walker or w/c? No  Grab bars in the bathroom? Yes  Shower chair or bench in shower? Yes  Elevated toilet seat or a  handicapped toilet? Yes   TIMED UP AND GO:  Was the test performed? No . Telephonic visit.  Cognitive Function: Normal cognitive status assessed by direct observation by this Nurse Health Advisor. No abnormalities found.   MMSE - Mini Mental State Exam 01/24/2018  Orientation to time 5  Orientation to Place 5  Registration 3  Attention/ Calculation 5  Recall 3  Language- name 2 objects 2  Language- repeat 1  Language- follow 3 step command 3  Language- read & follow direction 1  Write a sentence 1  Copy design 1  Total score 30     6CIT Screen 04/07/2020 04/07/2019  What Year? 0 points 0 points  What month? 0 points 0 points  What time? 0 points 0 points  Count back from 20 0 points 0 points  Months in reverse 0 points 0 points  Repeat phrase 0 points 0 points  Total Score 0 0    Immunizations Immunization History  Administered Date(s) Administered   Influenza Split 08/02/2013   Influenza, High Dose Seasonal PF 07/26/2016, 11/08/2018   Influenza,inj,Quad PF,6+ Mos 08/19/2014   Influenza-Unspecified 08/08/2019   Moderna Sars-Covid-2 Vaccination 03/15/2020, 04/12/2020   Pneumococcal Conjugate-13 12/31/2013   Pneumococcal Polysaccharide-23 11/29/2016   Td 06/05/2018   Tdap 12/02/2006    TDAP status: Up to date  Flu Vaccine status: Up to date  Pneumococcal vaccine status: Up to date  Covid-19 vaccine status: Completed vaccines  Qualifies for Shingles Vaccine? Yes   Zostavax completed No   Shingrix Completed?: No.    Education has been provided regarding the importance of this vaccine. Patient has been advised to call insurance company to determine out of pocket expense if they have not yet  received this vaccine. Advised may also receive vaccine at local pharmacy or Health Dept. Verbalized acceptance and understanding.  Screening Tests Health Maintenance  Topic Date Due   Zoster Vaccines- Shingrix (1 of 2) Never done   Hepatitis C Screening  05/19/2021 (Originally  10/20/1964)   COVID-19 Vaccine (3 - Moderna risk series) 01/30/2022 (Originally 05/10/2020)   INFLUENZA VACCINE  05/30/2021   COLONOSCOPY (Pts 45-28yrs Insurance coverage will need to be confirmed)  04/26/2023   TETANUS/TDAP  06/05/2028   PNA vac Low Risk Adult  Completed   HPV VACCINES  Aged Out    Health Maintenance  Health Maintenance Due  Topic Date Due   Zoster Vaccines- Shingrix (1 of 2) Never done    Colorectal cancer screening: Type of screening: Colonoscopy. Completed 04/25/2013. Repeat every 10 years  Lung Cancer Screening: (Low Dose CT Chest recommended if Age 49-80 years, 30 pack-year currently smoking OR have quit w/in 15years.) does not qualify.   Additional Screening:  Hepatitis C Screening: does qualify; Needs drawn at next visit  Vision Screening: Recommended annual ophthalmology exams for early detection of glaucoma and other disorders of the eye. Is the patient up to date with their annual eye exam?  Yes  Who is the provider or what is the name of the office in which the patient attends annual eye exams? Dr Marin Comment in [place] If pt is not established with a provider, would they like to be referred to a provider to establish care? No .   Dental Screening: Recommended annual dental exams for proper oral hygiene  Community Resource Referral / Chronic Care Management: CRR required this visit?  No   CCM required this visit?  No      Plan:     I have personally reviewed and noted the following in the patient's chart:   Medical and social history Use of alcohol, tobacco or illicit drugs  Current medications and supplements including opioid prescriptions. Patient is currently taking opioid prescriptions. Information provided to patient regarding non-opioid alternatives. Patient advised to discuss non-opioid treatment plan with their provider. Functional ability and status Nutritional status Physical activity Advanced directives List of other  physicians Hospitalizations, surgeries, and ER visits in previous 12 months Vitals Screenings to include cognitive, depression, and falls Referrals and appointments  In addition, I have reviewed and discussed with patient certain preventive protocols, quality metrics, and best practice recommendations. A written personalized care plan for preventive services as well as general preventive health recommendations were provided to patient.     Sandrea Hammond, LPN   9/37/9024   Nurse Notes: None

## 2021-04-08 NOTE — Patient Instructions (Addendum)
Mr. Bradley Wong , Thank you for taking time to come for your Medicare Wellness Visit. I appreciate your ongoing commitment to your health goals. Please review the following plan we discussed and let me know if I can assist you in the future.   Screening recommendations/referrals: Colonoscopy: Done 04/25/2013 - Repeat in 10 years Recommended yearly ophthalmology/optometry visit for glaucoma screening and checkup Recommended yearly dental visit for hygiene and checkup  Vaccinations: Influenza vaccine: Done 2021 - Repeat annually Pneumococcal vaccine: Done 12/31/2013 & 11/29/2016 Tdap vaccine: Done 06/05/2018 - Repeat in 10 years Shingles vaccine: Shingrix discussed. Please contact your pharmacy for coverage information.    Covid-19: Done 03/15/20 & 04/12/20 - declines booster  Advanced directives: Advance directive discussed with you today. I have provided a copy for you to complete at home and have notarized. Once this is complete please bring a copy in to our office so we can scan it into your chart.  Conditions/risks identified: Aim for 30 minutes of exercise or brisk walking each day, drink 6-8 glasses of water and eat lots of fruits and vegetables.  Next appointment: Follow up in one year for your annual wellness visit.   Preventive Care 21 Years and Older, Male  Preventive care refers to lifestyle choices and visits with your health care provider that can promote health and wellness. What does preventive care include? A yearly physical exam. This is also called an annual well check. Dental exams once or twice a year. Routine eye exams. Ask your health care provider how often you should have your eyes checked. Personal lifestyle choices, including: Daily care of your teeth and gums. Regular physical activity. Eating a healthy diet. Avoiding tobacco and drug use. Limiting alcohol use. Practicing safe sex. Taking low doses of aspirin every day. Taking vitamin and mineral supplements as  recommended by your health care provider. What happens during an annual well check? The services and screenings done by your health care provider during your annual well check will depend on your age, overall health, lifestyle risk factors, and family history of disease. Counseling  Your health care provider may ask you questions about your: Alcohol use. Tobacco use. Drug use. Emotional well-being. Home and relationship well-being. Sexual activity. Eating habits. History of falls. Memory and ability to understand (cognition). Work and work Statistician. Screening  You may have the following tests or measurements: Height, weight, and BMI. Blood pressure. Lipid and cholesterol levels. These may be checked every 5 years, or more frequently if you are over 55 years old. Skin check. Lung cancer screening. You may have this screening every year starting at age 64 if you have a 30-pack-year history of smoking and currently smoke or have quit within the past 15 years. Fecal occult blood test (FOBT) of the stool. You may have this test every year starting at age 24. Flexible sigmoidoscopy or colonoscopy. You may have a sigmoidoscopy every 5 years or a colonoscopy every 10 years starting at age 79. Prostate cancer screening. Recommendations will vary depending on your family history and other risks. Hepatitis C blood test. Hepatitis B blood test. Sexually transmitted disease (STD) testing. Diabetes screening. This is done by checking your blood sugar (glucose) after you have not eaten for a while (fasting). You may have this done every 1-3 years. Abdominal aortic aneurysm (AAA) screening. You may need this if you are a current or former smoker. Osteoporosis. You may be screened starting at age 40 if you are at high risk. Talk with your health  care provider about your test results, treatment options, and if necessary, the need for more tests. Vaccines  Your health care provider may recommend  certain vaccines, such as: Influenza vaccine. This is recommended every year. Tetanus, diphtheria, and acellular pertussis (Tdap, Td) vaccine. You may need a Td booster every 10 years. Zoster vaccine. You may need this after age 5. Pneumococcal 13-valent conjugate (PCV13) vaccine. One dose is recommended after age 54. Pneumococcal polysaccharide (PPSV23) vaccine. One dose is recommended after age 40. Talk to your health care provider about which screenings and vaccines you need and how often you need them. This information is not intended to replace advice given to you by your health care provider. Make sure you discuss any questions you have with your health care provider. Document Released: 11/12/2015 Document Revised: 07/05/2016 Document Reviewed: 08/17/2015 Elsevier Interactive Patient Education  2017 [place] Prevention in the Home Falls can cause injuries. They can happen to people of all ages. There are many things you can do to make your home safe and to help prevent falls. What can I do on the outside of my home? Regularly fix the edges of walkways and driveways and fix any cracks. Remove anything that might make you trip as you walk through a door, such as a raised step or threshold. Trim any bushes or trees on the path to your home. Use bright outdoor lighting. Clear any walking paths of anything that might make someone trip, such as rocks or tools. Regularly check to see if handrails are loose or broken. Make sure that both sides of any steps have handrails. Any raised decks and porches should have guardrails on the edges. Have any leaves, snow, or ice cleared regularly. Use sand or salt on walking paths during winter. Clean up any spills in your garage right away. This includes oil or grease spills. What can I do in the bathroom? Use night lights. Install grab bars by the toilet and in the tub and shower. Do not use towel bars as grab bars. Use non-skid mats or  decals in the tub or shower. If you need to sit down in the shower, use a plastic, non-slip stool. Keep the floor dry. Clean up any water that spills on the floor as soon as it happens. Remove soap buildup in the tub or shower regularly. Attach bath mats securely with double-sided non-slip rug tape. Do not have throw rugs and other things on the floor that can make you trip. What can I do in the bedroom? Use night lights. Make sure that you have a light by your bed that is easy to reach. Do not use any sheets or blankets that are too big for your bed. They should not hang down onto the floor. Have a firm chair that has side arms. You can use this for support while you get dressed. Do not have throw rugs and other things on the floor that can make you trip. What can I do in the kitchen? Clean up any spills right away. Avoid walking on wet floors. Keep items that you use a lot in easy-to-reach places. If you need to reach something above you, use a strong step stool that has a grab bar. Keep electrical cords out of the way. Do not use floor polish or wax that makes floors slippery. If you must use wax, use non-skid floor wax. Do not have throw rugs and other things on the floor that can make you trip. What can  I do with my stairs? Do not leave any items on the stairs. Make sure that there are handrails on both sides of the stairs and use them. Fix handrails that are broken or loose. Make sure that handrails are as long as the stairways. Check any carpeting to make sure that it is firmly attached to the stairs. Fix any carpet that is loose or worn. Avoid having throw rugs at the top or bottom of the stairs. If you do have throw rugs, attach them to the floor with carpet tape. Make sure that you have a light switch at the top of the stairs and the bottom of the stairs. If you do not have them, ask someone to add them for you. What else can I do to help prevent falls? Wear shoes that: Do not  have high heels. Have rubber bottoms. Are comfortable and fit you well. Are closed at the toe. Do not wear sandals. If you use a stepladder: Make sure that it is fully opened. Do not climb a closed stepladder. Make sure that both sides of the stepladder are locked into place. Ask someone to hold it for you, if possible. Clearly mark and make sure that you can see: Any grab bars or handrails. First and last steps. Where the edge of each step is. Use tools that help you move around (mobility aids) if they are needed. These include: Canes. Walkers. Scooters. Crutches. Turn on the lights when you go into a dark area. Replace any light bulbs as soon as they burn out. Set up your furniture so you have a clear path. Avoid moving your furniture around. If any of your floors are uneven, fix them. If there are any pets around you, be aware of where they are. Review your medicines with your doctor. Some medicines can make you feel dizzy. This can increase your chance of falling. Ask your doctor what other things that you can do to help prevent falls. This information is not intended to replace advice given to you by your health care provider. Make sure you discuss any questions you have with your health care provider. Document Released: 08/12/2009 Document Revised: 03/23/2016 Document Reviewed: 11/20/2014 Elsevier Interactive Patient Education  2017 [employer] Managing Pain Without Opioids Opioids are strong medicines used to treat moderate to severe pain. For some people, especially those who have long-term (chronic) pain, opioids may not be the best choice for pain management due to: Side effects like nausea, constipation, and sleepiness. The risk of addiction (opioid use disorder). The longer you take opioids, the greater your risk of addiction. Pain that lasts for more than 3 months is called chronic pain. Managing chronic pain usually requires more than one approach and is often provided  by a team of health care providers working together (multidisciplinary approach). Pain management may be done at a pain management center or pain clinic. Types of pain management without opioids Managing pain without opioids can involve: Non-opioid medicines. Exercises to help relieve pain and improve strength and range of motion (physical therapy). Therapy to help with everyday tasks and activities (occupational therapy). Therapy to help you find ways to relieve pain by doing things you enjoy (recreational therapy). Talk therapy (psychotherapy) and other mental health therapies. Medical treatments such as injections or devices. Making lifestyle changes. Pain management options Non-opioid medicines Non-opioid medicines for pain may include medicines taken by mouth (oral medicines), such as: Over-the-counter or prescription NSAIDs. These may be the first medicines used for pain. They  work well for muscle and bone pain, and they reduce swelling. Acetaminophen. This over-the-counter medicine may work well for milder pain but not swelling. Antidepressants. These may be used to treat chronic pain. A certain type of antidepressant (tricyclics) is often used. These medicines are given in lower doses for pain than when used for depression. Anticonvulsants. These are usually used to treat seizures but may also reduce nerve (neuropathic) pain. Muscle relaxants. These relieve pain caused by sudden muscle tightening (spasms). You may also use a type of pain medicine that is applied to the skin as a patch, cream, or gel (topical analgesic), such as a numbing medicine. These may cause fewer side effects than oralmedicines. Therapy Physical therapy involves doing exercises to gain strength and flexibility. A physical therapist may teach you exercises to move and stretch parts of your body that are weak, stiff, or painful. You can learn these exercises at physical therapy visits and practice them at home.  Physical therapy may also involve: Massage. Heat wraps or applying heat or cold to affected areas. Sending electrical signals through the skin to interrupt pain signals (transcutaneous electrical nerve stimulation, TENS). Sending weak lasers through the skin to reduce pain and swelling (low-level laser therapy). Using signals from your body to help you learn to regulate pain (biofeedback). Occupational therapy helps you learn ways to function at home and work withless pain. Recreational therapy may involve trying new activities or hobbies, such asdrawing or a physical activity. Types of mental health therapy for pain include: Cognitive behavioral therapy (CBT) to help you learn coping skills for dealing with pain. Acceptance and commitment therapy (ACT) to change the way you think and react to pain. Relaxation therapies, including muscle relaxation exercises and focusing your mind on the present moment to lower stress (mindfulness-based stress reduction). Pain management counseling. This may be individual, family, or group counseling.  Medical treatments Medical treatments for pain management include: Nerve block injections. These may include a pain blocker and anti-inflammatory medicines. You may have injections: Near the spine to relieve chronic back or neck pain. Into joints to relieve back or joint pain. Into nerve areas that supply a painful area to relieve body pain. Into muscles (trigger point injections) to relieve some painful muscle conditions. A medical device placed near your spine to help block pain signals and relieve nerve pain or chronic back pain (spinal cord stimulation device). Acupuncture. Follow these instructions at home Medicines Take over-the-counter and prescription medicines only as told by your health care provider. If you are taking pain medicine, ask your health care providers about possible side effects to watch out for. Do not drive or use heavy machinery  while taking prescription pain medicine. Lifestyle  Do not use drugs or alcohol to reduce pain. Limit alcohol intake to no more than 1 drink a day for nonpregnant women and 2 drinks a day for men. One drink equals 12 oz of beer, 5 oz of wine, or 1 oz of hard liquor. Do not use any products that contain nicotine or tobacco, such as cigarettes and e-cigarettes. These can delay healing. If you need help quitting, ask your health care provider. Eat a healthy diet and maintain a healthy weight. Poor diet and excess weight may make pain worse. Eat foods that are high in fiber. These include fresh fruits and vegetables, whole grains, and beans. Limit foods that are high in fat and processed sugars, such as fried and sweet foods. Exercise regularly. Exercise lowers stress and may help relieve  pain. Ask your health care provider what activities and exercises are safe for you. If your health care provider approves, join an exercise class that combines movement and stress reduction. Examples include yoga and tai chi. Get enough sleep. Lack of sleep may make pain worse. Lower stress as much as possible. Practice stress reduction techniques as told by your therapist.  General instructions Work with all your pain management providers to find the treatments that work best for you. You are an important member of your pain management team. There are many things you can do to reduce pain on your own. Consider joining an online or in-person support group for people who have chronic pain. Keep all follow-up visits as told by your health care providers. This is important. Where to find more information You can find more information about managing pain without opioids from: American Academy of Pain Medicine: painmed.[place] for Chronic Pain: instituteforchronicpain.org American Chronic Pain Association: theacpa.org Contact a health care provider if: You have side effects from pain medicine. Your pain gets  worse or does not get better with treatments or home care. You are struggling with anxiety or depression. Summary Many types of pain can be managed without opioids. Chronic pain may respond better to pain management without opioids. Pain is best managed with a team of providers working together. Pain management without opioids may include non-opioid medicines, medical treatments, physical therapy, mental health therapy, and lifestyle changes. Tell your health care providers if your pain gets worse or is not being managed well enough. This information is not intended to replace advice given to you by your health care provider. Make sure you discuss any questions you have with your healthcare provider. Document Revised: 07/27/2020 Document Reviewed: 07/29/2020 Elsevier Patient Education  [place].

## 2021-04-13 ENCOUNTER — Other Ambulatory Visit: Payer: Self-pay | Admitting: Physician Assistant

## 2021-04-13 DIAGNOSIS — U071 COVID-19: Secondary | ICD-10-CM | POA: Diagnosis not present

## 2021-04-13 MED ORDER — NIRMATRELVIR/RITONAVIR (PAXLOVID)TABLET: 3.0000 | ORAL_TABLET | Freq: Two times a day (BID) | ORAL | 0 refills | Status: DC

## 2021-04-13 NOTE — Progress Notes (Unsigned)
Outpatient Oral COVID Treatment Note  I connected with Bradley Wong on 04/13/2021/8:19 AM by telephone and verified that I am speaking with the correct person using two identifiers.  I discussed the limitations, risks, security, and privacy concerns of performing an evaluation and management service by telephone and the availability of in person appointments. I also discussed with the patient that there may be a patient responsible charge related to this service. The patient expressed understanding and agreed to proceed.  Patient location: home Provider location: office  Diagnosis: COVID-19 infection  Purpose of visit: Discussion of potential use of Molnupiravir or Paxlovid, a new treatment for mild to moderate COVID-19 viral infection in non-hospitalized patients.   Subjective: Patient is a 75 y.o. male who has been diagnosed with COVID 19 viral infection.  Their symptoms began on 6/13 with cough and fever.    Past Medical History:  Diagnosis Date   Arthritis    Coronary artery disease cardiologist--- dr cooper   per coronary CTA 01-15-2018, mild nonobstructive CAD;   nuclear stress test 08-05-2012 in epic,  showed normal perfusion w/ no ischemia, ef 69%   History of cancer chemotherapy 2000   for thyroid cancer   History of colon polyps    History of kidney stones    History of thyroid cancer 2000   status post total thyroidectomy and completed chemo same year for papillary cancer ;   followed by Dr K. Hairston (endocrinologist), notes in epic,  no recurrence    HTN (hypertension)    followed by pcp and cardiology   Hyperlipidemia    Hypothyroidism, postsurgical 2000   endocrinologist--- Dr K. Hairston   Mixed hyperlipidemia    followed by dr cooper   Pulmonary nodule    chest CT 07-17-2019 in epic   Right ureteral stone    Type 2 diabetes mellitus (HCC)    followed by pcp---  (06-07-2020 per pt does not check blood sugar)   Wears glasses    Wears partial dentures    upper     Allergies  Allergen Reactions   Niaspan [Niacin Er] Other (See Comments)    flushing   Penicillins Rash    Has patient had a PCN reaction causing immediate rash, facial/tongue/throat swelling, SOB or lightheadedness with hypotension: No Has patient had a PCN reaction causing severe rash involving mucus membranes or skin necrosis: No Has patient had a PCN reaction that required hospitalization: Unknown Has patient had a PCN reaction occurring within the last 10 years: No If all of the above answers are "NO", then may proceed with Cephalosporin use.      Current Outpatient Medications:    aspirin EC 81 MG tablet, Take 81 mg by mouth daily. Swallow whole., Disp: , Rfl:    Cholecalciferol (VITAMIN D-3) 5000 UNITS TABS, Take 5,000 Units by mouth daily. , Disp: , Rfl:    HYDROcodone-acetaminophen (NORCO) 10-325 MG tablet, Take by mouth., Disp: , Rfl:    levothyroxine (SYNTHROID) 125 MCG tablet, Take 1 tablet (125 mcg total) by mouth daily before breakfast., Disp: 90 tablet, Rfl: 3   olmesartan (BENICAR) 5 MG tablet, Take 1 tablet (5 mg total) by mouth daily., Disp: 90 tablet, Rfl: 3   oxyCODONE-acetaminophen (PERCOCET) 5-325 MG tablet, Take 1 tablet by mouth every 4 (four) hours as needed for severe pain. (Patient not taking: Reported on 04/08/2021), Disp: 20 tablet, Rfl: 0   rosuvastatin (CRESTOR) 40 MG tablet, Take 1 tablet (40 mg total) by mouth daily., Disp: 90   tablet, Rfl: 3   tamsulosin (FLOMAX) 0.4 MG CAPS capsule, Take 0.4 mg by mouth at bedtime. , Disp: , Rfl:   Objective: Patient appears/sounds ***.  They are in no apparent distress.  Breathing is non labored.  Mood and behavior are normal.  Laboratory Data:  Recent Results (from the past 2160 hour(s))  CBC with Differential/Platelet     Status: Abnormal   Collection Time: 01/28/21  9:06 AM  Result Value Ref Range   WBC 4.3 3.4 - 10.8 x10E3/uL   RBC 4.26 4.14 - 5.80 x10E6/uL   Hemoglobin 14.2 13.0 - 17.7 g/dL   Hematocrit  41.3 37.5 - 51.0 %   MCV 97 79 - 97 fL   MCH 33.3 (H) 26.6 - 33.0 pg   MCHC 34.4 31.5 - 35.7 g/dL   RDW 13.0 11.6 - 15.4 %   Platelets 156 150 - 450 x10E3/uL   Neutrophils 55 Not Estab. %   Lymphs 30 Not Estab. %   Monocytes 11 Not Estab. %   Eos 3 Not Estab. %   Basos 1 Not Estab. %   Neutrophils Absolute 2.4 1.4 - 7.0 x10E3/uL   Lymphocytes Absolute 1.3 0.7 - 3.1 x10E3/uL   Monocytes Absolute 0.5 0.1 - 0.9 x10E3/uL   EOS (ABSOLUTE) 0.1 0.0 - 0.4 x10E3/uL   Basophils Absolute 0.0 0.0 - 0.2 x10E3/uL   Immature Granulocytes 0 Not Estab. %   Immature Grans (Abs) 0.0 0.0 - 0.1 x10E3/uL  CMP14+EGFR     Status: Abnormal   Collection Time: 01/28/21  9:06 AM  Result Value Ref Range   Glucose 107 (H) 65 - 99 mg/dL   BUN 33 (H) 8 - 27 mg/dL   Creatinine, Ser 1.22 0.76 - 1.27 mg/dL   eGFR 63 >59 mL/min/1.73   BUN/Creatinine Ratio 27 (H) 10 - 24   Sodium 140 134 - 144 mmol/L   Potassium 4.8 3.5 - 5.2 mmol/L   Chloride 105 96 - 106 mmol/L   CO2 20 20 - 29 mmol/L   Calcium 9.6 8.6 - 10.2 mg/dL   Total Protein 6.0 6.0 - 8.5 g/dL   Albumin 4.5 3.7 - 4.7 g/dL   Globulin, Total 1.5 1.5 - 4.5 g/dL   Albumin/Globulin Ratio 3.0 (H) 1.2 - 2.2   Bilirubin Total 0.7 0.0 - 1.2 mg/dL   Alkaline Phosphatase 57 44 - 121 IU/L   AST 19 0 - 40 IU/L   ALT 22 0 - 44 IU/L  Lipid panel     Status: Abnormal   Collection Time: 01/28/21  9:06 AM  Result Value Ref Range   Cholesterol, Total 88 (L) 100 - 199 mg/dL   Triglycerides 57 0 - 149 mg/dL   HDL 42 >39 mg/dL   VLDL Cholesterol Cal 14 5 - 40 mg/dL   LDL Chol Calc (NIH) 32 0 - 99 mg/dL   Chol/HDL Ratio 2.1 0.0 - 5.0 ratio    Comment:                                   T. Chol/HDL Ratio                                             Men  Women                                 1/2 Avg.Risk  3.4    3.3                                   Avg.Risk  5.0    4.4                                2X Avg.Risk  9.6    7.1                                3X Avg.Risk 23.4    11.0   TSH     Status: Abnormal   Collection Time: 01/28/21  9:06 AM  Result Value Ref Range   TSH 0.129 (L) 0.450 - 4.500 uIU/mL  Bayer DCA Hb A1c Waived     Status: None   Collection Time: 01/28/21  9:06 AM  Result Value Ref Range   HB A1C (BAYER DCA - WAIVED) 5.7 <7.0 %    Comment:                                       Diabetic Adult            <7.0                                       Healthy Adult        4.3 - 5.7                                                           (DCCT/NGSP) American Diabetes Association's Summary of Glycemic Recommendations for Adults with Diabetes: Hemoglobin A1c <7.0%. More stringent glycemic goals (A1c <6.0%) may further reduce complications at the cost of increased risk of hypoglycemia.   PSA, total and free     Status: Abnormal   Collection Time: 01/28/21  9:06 AM  Result Value Ref Range   Prostate Specific Ag, Serum 4.1 (H) 0.0 - 4.0 ng/mL    Comment: Roche ECLIA methodology. According to the American Urological Association, Serum PSA should decrease and remain at undetectable levels after radical prostatectomy. The AUA defines biochemical recurrence as an initial PSA value 0.2 ng/mL or greater followed by a subsequent confirmatory PSA value 0.2 ng/mL or greater. Values obtained with different assay methods or kits cannot be used interchangeably. Results cannot be interpreted as absolute evidence of the presence or absence of malignant disease.    PSA, Free 0.77 N/A ng/mL    Comment: Roche ECLIA methodology.   PSA, Free Pct 18.8 %    Comment: The table below lists the probability of prostate cancer for men with non-suspicious DRE results and total PSA between 4 and 10 ng/mL, by patient age Ricci Barker, [place], [id number]).                   % Free PSA       50-64 yr        66-75 yr  0.00-10.00%        56%             55%                  10.01-15.00%        24%             35%                  15.01-20.00%         17%             23%                  20.01-25.00%        10%             20%                       >25.00%         5%              9% Please note:  Catalona et al did not make specific               recommendations regarding the use of               percent free PSA for any other population               of men.      Assessment: 74 y.o. male with mild/moderate COVID 19 viral infection diagnosed on *** at high risk for progression to severe COVID 19.  Plan:  This patient is a 74 y.o. male that meets the following criteria for Emergency Use Authorization of: Paxlovid 1. Age >12 yr AND > 40 kg 2. SARS-COV-2 positive test 3. Symptom onset < 5 days 4. Mild-to-moderate COVID disease with high risk for severe progression to hospitalization or death  I have spoken and communicated the following to the patient or parent/caregiver regarding: Paxlovid is an unapproved drug that is authorized for use under an Emergency Use Authorization.  There are no adequate, approved, available products for the treatment of COVID-19 in adults who have mild-to-moderate COVID-19 and are at high risk for progressing to severe COVID-19, including hospitalization or death. Other therapeutics are currently authorized. For additional information on all products authorized for treatment or prevention of COVID-19, please see https://www.fda.gov/emergencypreparedness-and-response/mcm-legal-regulatory-and-policyframework/emergency-use-authorization.  There are benefits and risks of taking this treatment as outlined in the "Fact Sheet for Patients and Caregivers."  "Fact Sheet for Patients and Caregivers" was reviewed with patient. A hard copy will be provided to patient from pharmacy prior to the patient receiving treatment. Patients should continue to self-isolate and use infection control measures (e.g., wear mask, isolate, social distance, avoid sharing personal items, clean and disinfect "high touch" surfaces, and frequent  handwashing) according to CDC guidelines.  The patient or parent/caregiver has the option to accept or refuse treatment. Patient medication history was reviewed for potential drug interactions:No drug interactions Patient's GFR was calculated to be >60, and they were therefore prescribed Normal dose (GFR>60) - nirmatrelvir 150mg tab (2 tablet) by mouth twice daily AND ritonavir 100mg tab (1 tablet) by mouth twice daily  After reviewing above information with the patient, the patient agrees to receive Paxlovid.  Follow up instructions:    Take prescription BID x 5 days as directed Reach out to pharmacist for counseling on medication if desired For concerns regarding further COVID symptoms   please follow up with your PCP or urgent care For urgent or life-threatening issues, seek care at your local emergency department  The patient was provided an opportunity to ask questions, and all were answered. The patient agreed with the plan and demonstrated an understanding of the instructions.   Script sent to [facility] and opted to pick up RX.  The patient was advised to call their PCP or seek an in-person evaluation if the symptoms worsen or if the condition fails to improve as anticipated.   I provided 10 minutes of non face-to-face telephone visit time during this encounter, and > 50% was spent counseling as documented under my assessment & plan.  Angelena Form, PA-C 04/13/2021 /8:19 AM

## 2021-06-16 ENCOUNTER — Other Ambulatory Visit: Payer: Self-pay | Admitting: Family Medicine

## 2021-07-11 ENCOUNTER — Encounter: Payer: Self-pay | Admitting: Cardiovascular Disease

## 2021-07-11 ENCOUNTER — Ambulatory Visit (INDEPENDENT_AMBULATORY_CARE_PROVIDER_SITE_OTHER): Payer: Medicare Other | Admitting: Cardiovascular Disease

## 2021-07-11 ENCOUNTER — Other Ambulatory Visit: Payer: Self-pay

## 2021-07-11 VITALS — BP 100/70 | HR 60 | Ht 73.0 in | Wt 189.2 lb

## 2021-07-11 DIAGNOSIS — I2510 Atherosclerotic heart disease of native coronary artery without angina pectoris: Secondary | ICD-10-CM

## 2021-07-11 DIAGNOSIS — E782 Mixed hyperlipidemia: Secondary | ICD-10-CM | POA: Diagnosis not present

## 2021-07-11 DIAGNOSIS — I10 Essential (primary) hypertension: Secondary | ICD-10-CM | POA: Diagnosis not present

## 2021-07-11 NOTE — Progress Notes (Signed)
Cardiology Office Note:    Date:  07/11/2021   ID:  Bradley Wong, DOB [birth year], MRN [id number]  PCP:  Dettinger, Fransisca Kaufmann, MD   [facility] Providers Cardiologist:  Sherren Mocha, MD     Referring MD: Dettinger, Fransisca Kaufmann, MD   Chief Complaint  Patient presents with   Hypertension    History of Present Illness:    Bradley Wong is a 75 y.o. male with a hx of hypertension and nonobstructive coronary artery disease, presenting for follow-up evaluation.  The patient underwent a gated coronary CTA in 2019 demonstrating mild nonobstructive coronary artery disease.  The patient is here with his wife today.  He has been doing very well since last seen here 1 year ago.  The patient has lost a lot of weight through the [employer].  His weight has dropped from 222 pounds down to 189 pounds and has been stable now for several months.  He feels well and notes good control of his blood pressure and cholesterol.  He has no symptoms of chest pain, chest pressure, shortness of breath, or leg swelling.  Past Medical History:  Diagnosis Date   Arthritis    Coronary artery disease cardiologist--- dr Burt Knack   per coronary CTA 01-15-2018, mild nonobstructive CAD;   nuclear stress test 08-05-2012 in epic,  showed normal perfusion w/ no ischemia, ef 69%   History of cancer chemotherapy 2000   for thyroid cancer   History of colon polyps    History of kidney stones    History of thyroid cancer 2000   status post total thyroidectomy and completed chemo same year for papillary cancer ;   followed by Dr Raliegh Ip. Hairston (endocrinologist), notes in epic,  no recurrence    HTN (hypertension)    followed by pcp and cardiology   Hyperlipidemia    Hypothyroidism, postsurgical 2000   endocrinologist--- Dr Raliegh Ip. Hairston   Mixed hyperlipidemia    followed by dr Raschelle Wisenbaker   Pulmonary nodule    chest CT 07-17-2019 in epic   Right ureteral stone    Type 2 diabetes mellitus ([place])    followed by pcp---   (06-07-2020 per pt does not check blood sugar)   Wears glasses    Wears partial dentures    upper    Past Surgical History:  Procedure Laterality Date   CYSTOSCOPY WITH STENT PLACEMENT Left 09/20/2018   Procedure: CYSTOSCOPY WITH STENT PLACEMENT;  Surgeon: Bjorn Loser, MD;  Location: [facility];  Service: Urology;  Laterality: Left;   CYSTOSCOPY/URETEROSCOPY/HOLMIUM LASER/STENT PLACEMENT Right 06/10/2020   Procedure: CYSTOSCOPY RIGHT RETROGRADE URETEROSCOPY/HOLMIUM LASER/STENT PLACEMENT;  Surgeon: Irine Seal, MD;  Location: [facility];  Service: Urology;  Laterality: Right;   EXTRACORPOREAL SHOCK WAVE LITHOTRIPSY Right 05/27/2020   Procedure: RIGHT EXTRACORPOREAL SHOCK WAVE LITHOTRIPSY (ESWL);  Surgeon: Raynelle Bring, MD;  Location[facility];  Service: Urology;  Laterality: Right;   KNEE SURGERY Right 1979   PROSTATE BIOPSY  01/2013   TONSILLECTOMY  child   TOTAL THYROIDECTOMY Bilateral 2000    Current Medications: Current Meds  Medication Sig   aspirin EC 81 MG tablet Take 81 mg by mouth daily. Swallow whole.   Cholecalciferol (VITAMIN D-3) 5000 UNITS TABS Take 5,000 Units by mouth daily.    levothyroxine (SYNTHROID) 125 MCG tablet Take 1 tablet (125 mcg total) by mouth daily before breakfast.   olmesartan (BENICAR) 5 MG tablet Take 1 tablet (5 mg total) by mouth daily.   rosuvastatin (  CRESTOR) 40 MG tablet Take 1 tablet (40 mg total) by mouth daily.   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  Tobacco Use   Smoking status: Never    Smokeless tobacco: Never  Scientific laboratory technician Use: Never used  Substance and Sexual Activity   Alcohol use: Not Currently   Drug use: Never   Sexual activity: Not on file  Other Topics Concern   Not on file  Social History Narrative   Not on file   Social Determinants of Health   Financial Resource Strain: Low Risk    Difficulty of Paying Living Expenses: Not hard at all  Food Insecurity: No Food Insecurity   Worried About Charity fundraiser in the Last Year: Never true   Arboriculturist in the Last Year: Never true  Transportation Needs: No Transportation Needs   Lack of Transportation (Medical): No   Lack of Transportation (Non-Medical): No  Physical Activity: Sufficiently Active   Days of Exercise per Week: 7 days   Minutes of Exercise per Session: 30 min  Stress: No Stress Concern Present   Feeling of Stress : Not at all  Social Connections: Socially Integrated   Frequency of Communication with Friends and Family: More than three times a week   Frequency of Social Gatherings with Friends and Family: More than three times a week   Attends Religious Services: More than 4 times per year   Active Member of [employer] or Organizations: Yes   Attends Music therapist: More than 4 times per year   Marital Status: Married     Family History: The patient's family history includes Cancer in his paternal grandfather; Cancer (age of onset: 23) in his father; Congestive Heart Failure in his mother; Coronary artery disease (age of onset: 49) in his mother; Diabetes in his brother, brother, and maternal grandmother; Heart disease in his brother and brother; Hypertension in his mother; Kidney failure in his brother and brother. There is no history of Colon cancer, Rectal cancer, or Stomach cancer.  ROS:   Please see the history of present illness.    All other systems reviewed and are negative.  EKGs/Labs/Other Studies Reviewed:    The following studies were reviewed  today: Gated coronary CTA 01/15/2018: IMPRESSION: 1. Coronary calcium score of 35. This was 31 percentile for age and sex matched control.   2. Normal coronary origin with right dominance.   3. Mild non-obstructive CAD in the left main and proximal LAD.  EKG:  EKG is ordered today.  The ekg ordered today demonstrates normal sinus rhythm 60 bpm, within normal limits.  Recent Labs: 01/28/2021: ALT 22; BUN 33; Creatinine, Ser 1.22; Hemoglobin 14.2; Platelets 156; Potassium 4.8; Sodium 140; TSH 0.129  Recent Lipid Panel    Component Value Date/Time   CHOL 88 (L) 01/28/2021 0906   CHOL 94 04/30/2013 0810   TRIG 57 01/28/2021 0906   TRIG 62 04/18/2017 1050   TRIG 53 04/30/2013 0810   HDL 42 01/28/2021 0906  HDL 44 04/18/2017 1050   HDL 36 (L) 04/30/2013 0810   CHOLHDL 2.1 01/28/2021 0906   LDLCALC 32 01/28/2021 0906   LDLCALC 39 05/12/2014 0841   LDLCALC 47 04/30/2013 0810     Risk Assessment/Calculations:           Physical Exam:    VS:  BP 100/70 Comment: right arm 112/72  Pulse 60   Ht '6\' 1"'$  (1.854 m)   Wt 189 lb 3.2 oz (85.8 kg)   SpO2 97%   BMI 24.96 kg/m     Wt Readings from Last 3 Encounters:  07/11/21 189 lb 3.2 oz (85.8 kg)  04/08/21 188 lb (85.3 kg)  01/28/21 192 lb (87.1 kg)     GEN:  Well nourished, well developed in no acute distress HEENT: Normal NECK: No JVD; No carotid bruits LYMPHATICS: No lymphadenopathy CARDIAC: RRR, no murmurs, rubs, gallops RESPIRATORY:  Clear to auscultation without rales, wheezing or rhonchi  ABDOMEN: Soft, non-tender, non-distended MUSCULOSKELETAL:  No edema; No deformity  SKIN: Warm and dry NEUROLOGIC:  Alert and oriented x 3 PSYCHIATRIC:  Normal affect   ASSESSMENT:    1. Essential hypertension   2. Mixed hyperlipidemia   3. Coronary artery disease involving native coronary artery of native heart without angina pectoris    PLAN:    In order of problems listed above:  Blood pressure is very well controlled on  Benicar.  Continue without change.  Recent labs reviewed January 28, 2021 with a creatinine of 1.22. Treated with rosuvastatin 40 mg daily.  Total cholesterol is 88, LDL 32, HDL 42. Patient with mild nonobstructive plaquing from coronary CTA in 2019.  His risk factors are very well controlled.  He takes aspirin 81 mg daily.  He has done a great job with weight loss.  He is having no anginal symptoms.  Medication Adjustments/Labs and Tests Ordered: Current medicines are reviewed at length with the patient today.  Concerns regarding medicines are outlined above.  Orders Placed This Encounter  Procedures   EKG 12-Lead   No orders of the defined types were placed in this encounter.   Patient Instructions  Medication Instructions:  Your physician recommends that you continue on your current medications as directed. Please refer to the Current Medication list given to you today.  *If you need a refill on your cardiac medications before your next appointment, please call your pharmacy*   Lab Work: none If you have labs (blood work) drawn today and your tests are completely normal, you will receive your results only by: [facility] (if you have MyChart) OR A paper copy in the mail If you have any lab test that is abnormal or we need to change your treatment, we will call you to review the results.   Testing/Procedures: none   Follow-Up: At [facility], you and your health needs are our priority.  As part of our continuing mission to provide you with exceptional heart care, we have created designated Provider Care Teams.  These Care Teams include your primary Cardiologist (physician) and Advanced Practice Providers (APPs -  Physician Assistants and Nurse Practitioners) who all work together to provide you with the care you need, when you need it.  We recommend signing up for the patient portal called "MyChart".  Sign up information is provided on this After Visit Summary.  MyChart is  used to connect with patients for Virtual Visits (Telemedicine).  Patients are able to view lab/test results, encounter notes, upcoming appointments, etc.  Non-urgent messages can be sent to your provider as well.   To learn more about what you can do with MyChart, go to [email or web address].    Your next appointment:   12 month(s)  The format for your next appointment:   In Person  Provider:   Dr Burt Knack or care team   Other Instructions    Signed, Sherren Mocha, MD  07/11/2021 6:02 PM    [place]

## 2021-07-11 NOTE — Patient Instructions (Signed)
Medication Instructions:  Your physician recommends that you continue on your current medications as directed. Please refer to the Current Medication list given to you today.  *If you need a refill on your cardiac medications before your next appointment, please call your pharmacy*   Lab Work: none If you have labs (blood work) drawn today and your tests are completely normal, you will receive your results only by: [facility] (if you have MyChart) OR A paper copy in the mail If you have any lab test that is abnormal or we need to change your treatment, we will call you to review the results.   Testing/Procedures: none   Follow-Up: At [facility], you and your health needs are our priority.  As part of our continuing mission to provide you with exceptional heart care, we have created designated Provider Care Teams.  These Care Teams include your primary Cardiologist (physician) and Advanced Practice Providers (APPs -  Physician Assistants and Nurse Practitioners) who all work together to provide you with the care you need, when you need it.  We recommend signing up for the patient portal called "MyChart".  Sign up information is provided on this After Visit Summary.  MyChart is used to connect with patients for Virtual Visits (Telemedicine).  Patients are able to view lab/test results, encounter notes, upcoming appointments, etc.  Non-urgent messages can be sent to your provider as well.   To learn more about what you can do with MyChart, go to [email or web address].    Your next appointment:   12 month(s)  The format for your next appointment:   In Person  Provider:   Dr Burt Knack or care team   Other Instructions

## 2021-08-01 ENCOUNTER — Ambulatory Visit: Payer: Medicare Other | Admitting: Family Medicine

## 2021-08-03 ENCOUNTER — Other Ambulatory Visit: Payer: Self-pay

## 2021-08-03 ENCOUNTER — Ambulatory Visit (INDEPENDENT_AMBULATORY_CARE_PROVIDER_SITE_OTHER): Payer: Medicare Other | Admitting: Family Medicine

## 2021-08-03 ENCOUNTER — Encounter: Payer: Self-pay | Admitting: Family Medicine

## 2021-08-03 VITALS — BP 123/74 | HR 69 | Ht 73.0 in | Wt 184.0 lb

## 2021-08-03 DIAGNOSIS — R7303 Prediabetes: Secondary | ICD-10-CM | POA: Diagnosis not present

## 2021-08-03 DIAGNOSIS — I2510 Atherosclerotic heart disease of native coronary artery without angina pectoris: Secondary | ICD-10-CM

## 2021-08-03 DIAGNOSIS — E7800 Pure hypercholesterolemia, unspecified: Secondary | ICD-10-CM | POA: Diagnosis not present

## 2021-08-03 DIAGNOSIS — I10 Essential (primary) hypertension: Secondary | ICD-10-CM | POA: Diagnosis not present

## 2021-08-03 DIAGNOSIS — E890 Postprocedural hypothyroidism: Secondary | ICD-10-CM

## 2021-08-03 LAB — BAYER DCA HB A1C WAIVED: HB A1C (BAYER DCA - WAIVED): 5.4 % (ref 4.8–5.6)

## 2021-08-03 MED ORDER — ROSUVASTATIN CALCIUM 40 MG PO TABS: 40.0000 mg | ORAL_TABLET | Freq: Every day | ORAL | 3 refills | Status: DC

## 2021-08-03 MED ORDER — TAMSULOSIN HCL 0.4 MG PO CAPS: ORAL_CAPSULE | ORAL | 3 refills | Status: DC

## 2021-08-03 NOTE — Progress Notes (Signed)
BP 123/74   Pulse 69   Ht _0  (1.854 m)   Wt 184 lb (83.5 kg)   SpO2 97%   BMI 24.28 kg/m    Subjective:   Patient ID: Bradley Wong, male    DOB: 03/20/1946, 75 y.o.   MRN: [id number]  HPI: Bradley Wong is a 75 y.o. male presenting on 08/03/2021 for Medical Management of Chronic Issues, Hyperlipidemia, and Hypothyroidism   HPI Prediabetes  patient comes in today for recheck of his diabetes. Patient has been currently taking no medication has been diet controlled, will check A1c today.. Patient is currently on an ACE inhibitor/ARB. Patient has not seen an ophthalmologist this year. Patient denies any issues with their feet. The symptom started onset as an adult hypertension and hyperlipidemia and hypothyroidism ARE RELATED TO DM   Hypertension Patient is currently on olmesartan, and their blood pressure today is 123/74. Patient denies any lightheadedness or dizziness. Patient denies headaches, blurred vision, chest pains, shortness of breath, or weakness. Denies any side effects from medication and is content with current medication.   Hyperlipidemia Patient is coming in for recheck of his hyperlipidemia. The patient is currently taking Crestor. They deny any issues with myalgias or history of liver damage from it. They deny any focal numbness or weakness or chest pain.   Hypothyroidism recheck Patient is coming in for thyroid recheck today as well. They deny any issues with hair changes or heat or cold problems or diarrhea or constipation. They deny any chest pain or palpitations. They are currently on levothyroxine 125 micrograms   Relevant past medical, surgical, family and social history reviewed and updated as indicated. Interim medical history since our last visit reviewed. Allergies and medications reviewed and updated.  Review of Systems  Constitutional:  Negative for chills and fever.  Eyes:  Negative for visual disturbance.  Respiratory:  Negative for shortness  of breath and wheezing.   Cardiovascular:  Negative for chest pain and leg swelling.  Musculoskeletal:  Negative for back pain and gait problem.  Skin:  Negative for rash.  Neurological:  Negative for dizziness, weakness and light-headedness.  All other systems reviewed and are negative.  Per HPI unless specifically indicated above   Allergies as of 08/03/2021       Reactions   Niaspan [niacin Er] Other (See Comments)   flushing   Penicillins Rash   Has patient had a PCN reaction causing immediate rash, facial/tongue/throat swelling, SOB or lightheadedness with hypotension: No Has patient had a PCN reaction causing severe rash involving mucus membranes or skin necrosis: No Has patient had a PCN reaction that required hospitalization: Unknown Has patient had a PCN reaction occurring within the last 10 years: No If all of the above answers are "NO", then may proceed with Cephalosporin use.        Medication List        Accurate as of August 03, 2021 10:46 AM. If you have any questions, ask your nurse or doctor.          aspirin EC 81 MG tablet Take 81 mg by mouth daily. Swallow whole.   levothyroxine 125 MCG tablet Commonly known as: SYNTHROID Take 1 tablet (125 mcg total) by mouth daily before breakfast.   olmesartan 5 MG tablet Commonly known as: BENICAR Take 1 tablet (5 mg total) by mouth daily.   rosuvastatin 40 MG tablet Commonly known as: CRESTOR Take 1 tablet (40 mg total) by mouth daily.   tamsulosin  0.4 MG Caps capsule Commonly known as: FLOMAX TAKE (1) CAPSULE DAILY   Vitamin D-3 125 MCG (5000 UT) Tabs Take 5,000 Units by mouth daily.         Objective:   BP 123/74   Pulse 69   Ht _0  (1.854 m)   Wt 184 lb (83.5 kg)   SpO2 97%   BMI 24.28 kg/m   Wt Readings from Last 3 Encounters:  08/03/21 184 lb (83.5 kg)  07/11/21 189 lb 3.2 oz (85.8 kg)  04/08/21 188 lb (85.3 kg)    Physical Exam Vitals and nursing note reviewed.   Constitutional:      General: He is not in acute distress.    Appearance: He is well-developed. He is not diaphoretic.  Eyes:     General: No scleral icterus.    Conjunctiva/sclera: Conjunctivae normal.  Neck:     Thyroid: No thyromegaly.  Cardiovascular:     Rate and Rhythm: Normal rate and regular rhythm.     Heart sounds: Normal heart sounds. No murmur heard. Pulmonary:     Effort: Pulmonary effort is normal. No respiratory distress.     Breath sounds: Normal breath sounds. No wheezing.  Musculoskeletal:        General: No swelling. Normal range of motion.     Cervical back: Neck supple.  Lymphadenopathy:     Cervical: No cervical adenopathy.  Skin:    General: Skin is warm and dry.     Findings: No rash.  Neurological:     Mental Status: He is alert and oriented to person, place, and time.     Coordination: Coordination normal.  Psychiatric:        Behavior: Behavior normal.      Assessment & Plan:   Problem List Items Addressed This Visit       Cardiovascular and Mediastinum   Essential hypertension, benign - Primary (Chronic)   Relevant Medications   rosuvastatin (CRESTOR) 40 MG tablet   Other Relevant Orders   CBC with Differential/Platelet   CMP14+EGFR     Endocrine   Hypothyroidism, postsurgical   Relevant Orders   TSH     Other   Hyperlipemia   Relevant Medications   rosuvastatin (CRESTOR) 40 MG tablet   Other Relevant Orders   Lipid panel   Pre-diabetes   Relevant Orders   Bayer DCA Hb A1c Waived    Continue current medicine, he still sees his thyroid cancer doctor and then he also sees a urologist.  Seems to be doing well, asymptomatic, will check blood work. Follow up plan: Return in about 6 months (around 02/01/2022), or if symptoms worsen or fail to improve, for Physical exam and hypertension and thyroid and cholesterol and prediabetes.  Counseling provided for all of the vaccine components Orders Placed This Encounter  Procedures   CBC  with Differential/Platelet   CMP14+EGFR   Lipid panel   TSH   Bayer DCA Hb A1c Barnes, MD [facility] 08/03/2021, 10:46 AM

## 2021-08-04 LAB — CMP14+EGFR
ALT: 18 IU/L (ref 0–44)
AST: 14 IU/L (ref 0–40)
Albumin/Globulin Ratio: 2.4 — ABNORMAL HIGH (ref 1.2–2.2)
Albumin: 4.3 g/dL (ref 3.7–4.7)
Alkaline Phosphatase: 58 IU/L (ref 44–121)
BUN/Creatinine Ratio: 22 (ref 10–24)
BUN: 20 mg/dL (ref 8–27)
Bilirubin Total: 0.6 mg/dL (ref 0.0–1.2)
CO2: 23 mmol/L (ref 20–29)
Calcium: 9.3 mg/dL (ref 8.6–10.2)
Chloride: 107 mmol/L — ABNORMAL HIGH (ref 96–106)
Creatinine, Ser: 0.93 mg/dL (ref 0.76–1.27)
Globulin, Total: 1.8 g/dL (ref 1.5–4.5)
Glucose: 85 mg/dL (ref 70–99)
Potassium: 4.9 mmol/L (ref 3.5–5.2)
Sodium: 142 mmol/L (ref 134–144)
Total Protein: 6.1 g/dL (ref 6.0–8.5)
eGFR: 87 mL/min/{1.73_m2} (ref >59)

## 2021-08-04 LAB — CBC WITH DIFFERENTIAL/PLATELET
Basophils Absolute: 0 10*3/uL (ref 0.0–0.2)
Basos: 0 %
EOS (ABSOLUTE): 0.1 10*3/uL (ref 0.0–0.4)
Eos: 2 %
Hematocrit: 43 % (ref 37.5–51.0)
Hemoglobin: 14.9 g/dL (ref 13.0–17.7)
Immature Grans (Abs): 0 10*3/uL (ref 0.0–0.1)
Immature Granulocytes: 0 %
Lymphocytes Absolute: 1.2 10*3/uL (ref 0.7–3.1)
Lymphs: 26 %
MCH: 32.9 pg (ref 26.6–33.0)
MCHC: 34.7 g/dL (ref 31.5–35.7)
MCV: 95 fL (ref 79–97)
Monocytes Absolute: 0.4 10*3/uL (ref 0.1–0.9)
Monocytes: 8 %
Neutrophils Absolute: 2.9 10*3/uL (ref 1.4–7.0)
Neutrophils: 64 %
Platelets: 149 10*3/uL — ABNORMAL LOW (ref 150–450)
RBC: 4.53 x10E6/uL (ref 4.14–5.80)
RDW: 12.4 % (ref 11.6–15.4)
WBC: 4.5 10*3/uL (ref 3.4–10.8)

## 2021-08-04 LAB — LIPID PANEL
Chol/HDL Ratio: 2.1 ratio (ref 0.0–5.0)
Cholesterol, Total: 95 mg/dL — ABNORMAL LOW (ref 100–199)
HDL: 46 mg/dL (ref >39)
LDL Chol Calc (NIH): 37 mg/dL (ref 0–99)
Triglycerides: 45 mg/dL (ref 0–149)
VLDL Cholesterol Cal: 12 mg/dL (ref 5–40)

## 2021-08-04 LAB — TSH: TSH: 0.172 u[IU]/mL — ABNORMAL LOW (ref 0.450–4.500)

## 2021-08-09 ENCOUNTER — Other Ambulatory Visit: Payer: Self-pay | Admitting: *Deleted

## 2021-08-09 MED ORDER — ROSUVASTATIN CALCIUM 40 MG PO TABS
20.0000 mg | ORAL_TABLET | Freq: Every day | ORAL | 1 refills | Status: DC
Start: 2021-08-09 — End: 2022-02-01

## 2021-08-16 DIAGNOSIS — C73 Malignant neoplasm of thyroid gland: Secondary | ICD-10-CM | POA: Diagnosis not present

## 2021-08-16 DIAGNOSIS — Z85850 Personal history of malignant neoplasm of thyroid: Secondary | ICD-10-CM | POA: Diagnosis not present

## 2021-08-26 DIAGNOSIS — M5416 Radiculopathy, lumbar region: Secondary | ICD-10-CM | POA: Diagnosis not present

## 2021-08-26 DIAGNOSIS — I10 Essential (primary) hypertension: Secondary | ICD-10-CM | POA: Diagnosis not present

## 2021-11-23 DIAGNOSIS — N401 Enlarged prostate with lower urinary tract symptoms: Secondary | ICD-10-CM | POA: Diagnosis not present

## 2021-11-23 DIAGNOSIS — R350 Frequency of micturition: Secondary | ICD-10-CM | POA: Diagnosis not present

## 2021-11-23 DIAGNOSIS — R3912 Poor urinary stream: Secondary | ICD-10-CM | POA: Diagnosis not present

## 2021-11-23 DIAGNOSIS — Z87442 Personal history of urinary calculi: Secondary | ICD-10-CM | POA: Diagnosis not present

## 2021-11-23 DIAGNOSIS — R351 Nocturia: Secondary | ICD-10-CM | POA: Diagnosis not present

## 2022-02-01 ENCOUNTER — Ambulatory Visit (INDEPENDENT_AMBULATORY_CARE_PROVIDER_SITE_OTHER): Payer: Medicare Other | Admitting: Family Medicine

## 2022-02-01 ENCOUNTER — Encounter: Payer: Self-pay | Admitting: Family Medicine

## 2022-02-01 VITALS — BP 122/77 | HR 69 | Ht 73.0 in | Wt 196.0 lb

## 2022-02-01 DIAGNOSIS — R7303 Prediabetes: Secondary | ICD-10-CM | POA: Diagnosis not present

## 2022-02-01 DIAGNOSIS — E118 Type 2 diabetes mellitus with unspecified complications: Secondary | ICD-10-CM | POA: Diagnosis not present

## 2022-02-01 DIAGNOSIS — E7800 Pure hypercholesterolemia, unspecified: Secondary | ICD-10-CM | POA: Diagnosis not present

## 2022-02-01 DIAGNOSIS — E890 Postprocedural hypothyroidism: Secondary | ICD-10-CM

## 2022-02-01 DIAGNOSIS — I10 Essential (primary) hypertension: Secondary | ICD-10-CM

## 2022-02-01 LAB — BAYER DCA HB A1C WAIVED: HB A1C (BAYER DCA - WAIVED): 5.6 % (ref 4.8–5.6)

## 2022-02-01 MED ORDER — ROSUVASTATIN CALCIUM 40 MG PO TABS: 20.0000 mg | ORAL_TABLET | Freq: Every day | ORAL | 3 refills | Status: DC

## 2022-02-01 MED ORDER — LEVOTHYROXINE SODIUM 125 MCG PO TABS: 125.0000 ug | ORAL_TABLET | Freq: Every day | ORAL | 3 refills | Status: DC

## 2022-02-01 MED ORDER — OLMESARTAN MEDOXOMIL 5 MG PO TABS: 5.0000 mg | ORAL_TABLET | Freq: Every day | ORAL | 3 refills | Status: DC

## 2022-02-01 NOTE — Progress Notes (Signed)
? ?BP 122/77   Pulse 69   Ht '6\' 1"'  (1.854 m)   Wt 196 lb (88.9 kg)   SpO2 98%   BMI 25.86 kg/m?   ? ?Subjective:  ? ?Patient ID: Bradley Wong, male    DOB: 1946-07-31, 76 y.o.   MRN: [id number] ? ?HPI: ?Bradley Wong is a 76 y.o. male presenting on 02/01/2022 for Medical Management of Chronic Issues, Hypothyroidism, and Hypertension ? ? ?HPI ?Hypothyroidism recheck ?Patient is coming in for thyroid recheck today as well. They deny any issues with hair changes or heat or cold problems or diarrhea or constipation. They deny any chest pain or palpitations. They are currently on levothyroxine 125 micrograms, sees endocrinology for thyroid ? ?Hypertension ?Patient is currently on olmesartan, and their blood pressure today is 127/77. Patient denies any lightheadedness or dizziness. Patient denies headaches, blurred vision, chest pains, shortness of breath, or weakness. Denies any side effects from medication and is content with current medication.  ? ?Hyperlipidemia ?Patient is coming in for recheck of his hyperlipidemia. The patient is currently taking Crestor. They deny any issues with myalgias or history of liver damage from it. They deny any focal numbness or weakness or chest pain.  ? ?Type 2 diabetes mellitus ?Patient comes in today for recheck of his diabetes. Patient has been currently taking no medicine currently. Patient is currently on an ACE inhibitor/ARB. Patient has not seen an ophthalmologist this year. Patient denies any issues with their feet. The symptom started onset as an adult hypertension and hyperlipidemia and hypothyroidism ARE RELATED TO DM  ? ?Relevant past medical, surgical, family and social history reviewed and updated as indicated. Interim medical history since our last visit reviewed. ?Allergies and medications reviewed and updated. ? ?Review of Systems  ?Constitutional:  Negative for chills and fever.  ?Eyes:  Negative for visual disturbance.  ?Respiratory:  Negative for shortness  of breath and wheezing.   ?Cardiovascular:  Negative for chest pain and leg swelling.  ?Skin:  Negative for rash.  ?Neurological:  Negative for dizziness and light-headedness.  ?All other systems reviewed and are negative. ? ?Per HPI unless specifically indicated above ? ? ?Allergies as of 02/01/2022   ? ?   Reactions  ? Niaspan [niacin Er] Other (See Comments)  ? flushing  ? Penicillins Rash  ? Has patient had a PCN reaction causing immediate rash, facial/tongue/throat swelling, SOB or lightheadedness with hypotension: No ?Has patient had a PCN reaction causing severe rash involving mucus membranes or skin necrosis: No ?Has patient had a PCN reaction that required hospitalization: Unknown ?Has patient had a PCN reaction occurring within the last 10 years: No ?If all of the above answers are "NO", then may proceed with Cephalosporin use.  ? ?  ? ?  ?Medication List  ?  ? ?  ? Accurate as of February 01, 2022 10:39 AM. If you have any questions, ask your nurse or doctor.  ?  ?  ? ?  ? ?aspirin EC 81 MG tablet ?Take 81 mg by mouth daily. Swallow whole. ?  ?levothyroxine 125 MCG tablet ?Commonly known as: SYNTHROID ?Take 1 tablet (125 mcg total) by mouth daily before breakfast. ?  ?olmesartan 5 MG tablet ?Commonly known as: BENICAR ?Take 1 tablet (5 mg total) by mouth daily. ?  ?rosuvastatin 40 MG tablet ?Commonly known as: CRESTOR ?Take 0.5 tablets (20 mg total) by mouth daily. ?  ?tamsulosin 0.4 MG Caps capsule ?Commonly known as: FLOMAX ?TAKE (1) CAPSULE DAILY ?  ?  Vitamin D-3 125 MCG (5000 UT) Tabs ?Take 5,000 Units by mouth daily. ?  ? ?  ? ? ? ?Objective:  ? ?BP 122/77   Pulse 69   Ht '6\' 1"'  (1.854 m)   Wt 196 lb (88.9 kg)   SpO2 98%   BMI 25.86 kg/m?   ?Wt Readings from Last 3 Encounters:  ?02/01/22 196 lb (88.9 kg)  ?08/03/21 184 lb (83.5 kg)  ?07/11/21 189 lb 3.2 oz (85.8 kg)  ?  ?Physical Exam ?Vitals and nursing note reviewed.  ?Constitutional:   ?   General: He is not in acute distress. ?   Appearance: He is  well-developed. He is not diaphoretic.  ?Eyes:  ?   General: No scleral icterus. ?   Conjunctiva/sclera: Conjunctivae normal.  ?Neck:  ?   Thyroid: No thyromegaly.  ?Cardiovascular:  ?   Rate and Rhythm: Normal rate and regular rhythm.  ?   Heart sounds: Normal heart sounds. No murmur heard. ?Pulmonary:  ?   Effort: Pulmonary effort is normal. No respiratory distress.  ?   Breath sounds: Normal breath sounds. No wheezing.  ?Musculoskeletal:     ?   General: No swelling. Normal range of motion.  ?   Cervical back: Neck supple.  ?Lymphadenopathy:  ?   Cervical: No cervical adenopathy.  ?Skin: ?   General: Skin is warm and dry.  ?   Findings: No rash.  ?Neurological:  ?   Mental Status: He is alert and oriented to person, place, and time.  ?   Coordination: Coordination normal.  ?Psychiatric:     ?   Behavior: Behavior normal.  ? ? ? ? ?Assessment & Plan:  ? ?Problem List Items Addressed This Visit   ? ?  ? Cardiovascular and Mediastinum  ? Essential hypertension, benign - Primary (Chronic)  ? Relevant Orders  ? CBC with Differential/Platelet  ? CMP14+EGFR  ? Lipid panel  ? TSH  ? Bayer DCA Hb A1c Waived  ?  ? Endocrine  ? Hypothyroidism, postsurgical  ? Relevant Orders  ? CBC with Differential/Platelet  ? CMP14+EGFR  ? Lipid panel  ? TSH  ? Bayer DCA Hb A1c Waived  ?  ? Other  ? Hyperlipemia  ? Relevant Orders  ? CBC with Differential/Platelet  ? CMP14+EGFR  ? Lipid panel  ? TSH  ? Bayer DCA Hb A1c Waived  ? Pre-diabetes  ? ?Other Visit Diagnoses   ? ? Controlled type 2 diabetes mellitus with complication, without long-term current use of insulin ([place])      ? Relevant Orders  ? CBC with Differential/Platelet  ? CMP14+EGFR  ? Lipid panel  ? TSH  ? Bayer DCA Hb A1c Waived  ? ?  ?  ?We will check blood work today, seems to be doing well, blood pressure looks good.  No changes, we will see her blood work is ?Follow up plan: ?Return in about 6 months (around 08/03/2022), or if symptoms worsen or fail to improve, for  Diabetes thyroid and hypertension. ? ?Counseling provided for all of the vaccine components ?Orders Placed This Encounter  ?Procedures  ? CBC with Differential/Platelet  ? CMP14+EGFR  ? Lipid panel  ? TSH  ? Bayer DCA Hb A1c Waived  ? ? ?Caryl Pina, MD ?[facility] ?02/01/2022, 10:39 AM ? ? ? ? ?

## 2022-02-02 LAB — CBC WITH DIFFERENTIAL/PLATELET
Basophils Absolute: 0 10*3/uL (ref 0.0–0.2)
Basos: 0 %
EOS (ABSOLUTE): 0.1 10*3/uL (ref 0.0–0.4)
Eos: 2 %
Hematocrit: 44.1 % (ref 37.5–51.0)
Hemoglobin: 14.8 g/dL (ref 13.0–17.7)
Immature Grans (Abs): 0 10*3/uL (ref 0.0–0.1)
Immature Granulocytes: 0 %
Lymphocytes Absolute: 1.3 10*3/uL (ref 0.7–3.1)
Lymphs: 28 %
MCH: 32.3 pg (ref 26.6–33.0)
MCHC: 33.6 g/dL (ref 31.5–35.7)
MCV: 96 fL (ref 79–97)
Monocytes Absolute: 0.3 10*3/uL (ref 0.1–0.9)
Monocytes: 7 %
Neutrophils Absolute: 2.9 10*3/uL (ref 1.4–7.0)
Neutrophils: 63 %
Platelets: 140 10*3/uL — ABNORMAL LOW (ref 150–450)
RBC: 4.58 x10E6/uL (ref 4.14–5.80)
RDW: 12.6 % (ref 11.6–15.4)
WBC: 4.6 10*3/uL (ref 3.4–10.8)

## 2022-02-02 LAB — TSH: TSH: 0.133 u[IU]/mL — ABNORMAL LOW (ref 0.450–4.500)

## 2022-02-02 LAB — LIPID PANEL
Chol/HDL Ratio: 2.1 ratio (ref 0.0–5.0)
Cholesterol, Total: 99 mg/dL — ABNORMAL LOW (ref 100–199)
HDL: 47 mg/dL (ref >39)
LDL Chol Calc (NIH): 43 mg/dL (ref 0–99)
Triglycerides: 26 mg/dL (ref 0–149)
VLDL Cholesterol Cal: 9 mg/dL (ref 5–40)

## 2022-02-02 LAB — CMP14+EGFR
ALT: 18 IU/L (ref 0–44)
AST: 16 IU/L (ref 0–40)
Albumin/Globulin Ratio: 2.4 — ABNORMAL HIGH (ref 1.2–2.2)
Albumin: 4.4 g/dL (ref 3.7–4.7)
Alkaline Phosphatase: 62 IU/L (ref 44–121)
BUN/Creatinine Ratio: 14 (ref 10–24)
BUN: 15 mg/dL (ref 8–27)
Bilirubin Total: 0.9 mg/dL (ref 0.0–1.2)
CO2: 23 mmol/L (ref 20–29)
Calcium: 9.4 mg/dL (ref 8.6–10.2)
Chloride: 107 mmol/L — ABNORMAL HIGH (ref 96–106)
Creatinine, Ser: 1.1 mg/dL (ref 0.76–1.27)
Globulin, Total: 1.8 g/dL (ref 1.5–4.5)
Glucose: 109 mg/dL — ABNORMAL HIGH (ref 70–99)
Potassium: 5 mmol/L (ref 3.5–5.2)
Sodium: 142 mmol/L (ref 134–144)
Total Protein: 6.2 g/dL (ref 6.0–8.5)
eGFR: 71 mL/min/{1.73_m2} (ref >59)

## 2022-04-11 ENCOUNTER — Ambulatory Visit (INDEPENDENT_AMBULATORY_CARE_PROVIDER_SITE_OTHER): Payer: Medicare Other

## 2022-04-11 VITALS — Wt 194.0 lb

## 2022-04-11 DIAGNOSIS — Z0000 Encounter for general adult medical examination without abnormal findings: Secondary | ICD-10-CM

## 2022-04-11 NOTE — Progress Notes (Cosign Needed)
Subjective:   Bradley Wong is a 76 y.o. male who presents for Medicare Annual/Subsequent preventive examination.  Virtual Visit via Telephone Note  I connected with  Bradley Wong on 04/11/22 at  9:45 AM EDT by telephone and verified that I am speaking with the correct person using two identifiers.  Location: Patient: Home Provider: [facility] Persons participating in the virtual visit: patient/Nurse Health Advisor   I discussed the limitations, risks, security and privacy concerns of performing an evaluation and management service by telephone and the availability of in person appointments. The patient expressed understanding and agreed to proceed.  Interactive audio and video telecommunications were attempted between this nurse and patient, however failed, due to patient having technical difficulties OR patient did not have access to video capability.  We continued and completed visit with audio only.  Some vital signs may be absent or patient reported.   Chari Parmenter E Syler Norcia, LPN   Review of Systems     Cardiac Risk Factors include: advanced age (>85mn, >>6women);dyslipidemia;hypertension;male gender     Objective:    Today's Vitals   04/11/22 1008  Weight: 194 lb (88 kg)   Body mass index is 25.6 kg/m.     04/11/2022   10:13 AM 04/08/2021   10:16 AM 06/10/2020    9:00 AM 04/07/2020    9:30 AM 04/07/2019   11:31 AM 09/30/2018    8:01 AM 09/20/2018    4:14 PM  Advanced Directives  Does Patient Have a Medical Advance Directive? No No No Yes No Yes Yes  Type of Advance Directive    Living will;Healthcare Power of [place]  Does patient want to make changes to medical advance directive?      No - Patient declined   Copy of [place] Chart?      No - copy requested No - copy requested  Would patient like information on creating a medical advance directive? No - Patient  declined Yes (MAU/Ambulatory/Procedural Areas - Information given) No - Patient declined  Yes (MAU/Ambulatory/Procedural Areas - Information given)      Current Medications (verified) Outpatient Encounter Medications as of 04/11/2022  Medication Sig   aspirin EC 81 MG tablet Take 81 mg by mouth daily. Swallow whole.   Cholecalciferol (VITAMIN D-3) 5000 UNITS TABS Take 5,000 Units by mouth daily.    levothyroxine (SYNTHROID) 125 MCG tablet Take 1 tablet (125 mcg total) by mouth daily before breakfast.   olmesartan (BENICAR) 5 MG tablet Take 1 tablet (5 mg total) by mouth daily.   rosuvastatin (CRESTOR) 40 MG tablet Take 0.5 tablets (20 mg total) by mouth daily.   tadalafil (CIALIS) 5 MG tablet Take 5 mg by mouth daily.   tamsulosin (FLOMAX) 0.4 MG CAPS capsule TAKE (1) CAPSULE DAILY   No facility-administered encounter medications on file as of 04/11/2022.    Allergies (verified) Niaspan [niacin er] and Penicillins   History: Past Medical History:  Diagnosis Date   Arthritis    Coronary artery disease cardiologist--- dr cBurt Knack  per coronary CTA 01-15-2018, mild nonobstructive CAD;   nuclear stress test 08-05-2012 in epic,  showed normal perfusion w/ no ischemia, ef 69%   History of cancer chemotherapy 2000   for thyroid cancer   History of colon polyps    History of kidney stones    History of thyroid cancer 2000   status post total thyroidectomy and completed chemo  same year for papillary cancer ;   followed by Dr Raliegh Ip. Hairston (endocrinologist), notes in epic,  no recurrence    HTN (hypertension)    followed by pcp and cardiology   Hyperlipidemia    Hypothyroidism, postsurgical 2000   endocrinologist--- Dr Raliegh Ip. Hairston   Mixed hyperlipidemia    followed by dr cooper   Pulmonary nodule    chest CT 07-17-2019 in epic   Right ureteral stone    Type 2 diabetes mellitus ([place])    followed by pcp---  (06-07-2020 per pt does not check blood sugar)   Wears glasses    Wears partial  dentures    upper   Past Surgical History:  Procedure Laterality Date   CYSTOSCOPY WITH STENT PLACEMENT Left 09/20/2018   Procedure: CYSTOSCOPY WITH STENT PLACEMENT;  Surgeon: Bjorn Loser, MD;  Location: [facility];  Service: Urology;  Laterality: Left;   CYSTOSCOPY/URETEROSCOPY/HOLMIUM LASER/STENT PLACEMENT Right 06/10/2020   Procedure: CYSTOSCOPY RIGHT RETROGRADE URETEROSCOPY/HOLMIUM LASER/STENT PLACEMENT;  Surgeon: Irine Seal, MD;  Location[facility];  Service: Urology;  Laterality: Right;   EXTRACORPOREAL SHOCK WAVE LITHOTRIPSY Right 05/27/2020   Procedure: RIGHT EXTRACORPOREAL SHOCK WAVE LITHOTRIPSY (ESWL);  Surgeon: Raynelle Bring, MD;  Location: [facility];  Service: Urology;  Laterality: Right;   KNEE SURGERY Right 1979   PROSTATE BIOPSY  01/2013   TONSILLECTOMY  child   TOTAL THYROIDECTOMY Bilateral 2000   Family History  Problem Relation Age of Onset   Coronary artery disease Mother 35   Hypertension Mother    Congestive Heart Failure Mother    Cancer Father 60       throat and bone cancer   Diabetes Brother        deceased   Heart disease Brother    Kidney failure Brother    Diabetes Maternal Grandmother    Cancer Paternal Grandfather        leukemia   Diabetes Brother    Heart disease Brother    Kidney failure Brother    Colon cancer Neg Hx    Rectal cancer Neg Hx    Stomach cancer Neg Hx    Social History   Socioeconomic History   Marital status: Married    Spouse name: [name]   Number of children: 4   Years of education: 14   Highest education level: Associate degree: occupational, Hotel manager, or vocational program  Occupational History   Occupation: retired    Fish farm manager: Engineer, [employer]    Comment: Dealer work  Tobacco Use   Smoking status: Never   Smokeless tobacco: Never  Vaping Use   Vaping Use: Never used  Substance and Sexual Activity   Alcohol use: Not Currently   Drug use: Never   Sexual activity: Not  on file  Other Topics Concern   Not on file  Social History Narrative   Not on file   Social Determinants of Health   Financial Resource Strain: Low Risk  (04/11/2022)   Overall Financial Resource Strain (CARDIA)    Difficulty of Paying Living Expenses: Not hard at all  Food Insecurity: No Food Insecurity (04/11/2022)   Hunger Vital Sign    Worried About Running Out of Food in the Last Year: Never true    Ran Out of Food in the Last Year: Never true  Transportation Needs: No Transportation Needs (04/11/2022)   PRAPARE - Hydrologist (Medical): No    Lack of Transportation (Non-Medical): No  Physical Activity:  Sufficiently Active (04/11/2022)   Exercise Vital Sign    Days of Exercise per Week: 7 days    Minutes of Exercise per Session: 30 min  Stress: No Stress Concern Present (04/11/2022)   [place]    Feeling of Stress : Not at all  Social Connections: [place] (04/11/2022)   Social Connection and Isolation Panel [NHANES]    Frequency of Communication with Friends and Family: More than three times a week    Frequency of Social Gatherings with Friends and Family: More than three times a week    Attends Religious Services: More than 4 times per year    Active Member of [employer] or Organizations: Yes    Attends Music therapist: More than 4 times per year    Marital Status: Married    Tobacco Counseling Counseling given: Not Answered   Clinical Intake:  Pre-visit preparation completed: Yes  Pain : No/denies pain     BMI - recorded: 25.6 Nutritional Status: BMI 25 -29 Overweight Nutritional Risks: None Diabetes: No  How often do you need to have someone help you when you read instructions, pamphlets, or other written materials from your doctor or pharmacy?: 1 - Never  Diabetic? no  Interpreter Needed?: No  Information entered by :: Zymiere Trostle,  LPN   Activities of Daily Living    04/11/2022   10:13 AM  In your present state of health, do you have any difficulty performing the following activities:  Hearing? 0  Vision? 0  Difficulty concentrating or making decisions? 0  Walking or climbing stairs? 1  Comment hurts knees  Dressing or bathing? 0  Doing errands, shopping? 0  Preparing Food and eating ? N  Using the Toilet? N  In the past six months, have you accidently leaked urine? N  Do you have problems with loss of bowel control? N  Managing your Medications? N  Managing your Finances? N  Housekeeping or managing your Housekeeping? N    Patient Care Team: Dettinger, Fransisca Kaufmann, MD as PCP - General (Family Medicine) Sherren Mocha, MD as PCP - Cardiology (Cardiology) Kristian Covey, MD (Internal Medicine) Irine Seal, MD (Urology) Bjorn Loser, MD as Consulting Physician (Urology) Vallarie Mare, MD as Consulting Physician (Neurosurgery) Harlen Labs, MD as Referring Physician (Optometry)  Indicate any recent Medical Services you may have received from other than [facility] providers in the past year (date may be approximate).     Assessment:   This is a routine wellness examination for Bradley Wong.  Hearing/Vision screen Hearing Screening - Comments:: Denies hearing difficulties   Vision Screening - Comments:: Wears rx glasses - up to date with routine eye exams with [facility] [place]  Dietary issues and exercise activities discussed: Current Exercise Habits: Home exercise routine, Type of exercise: walking;Other - see comments (working on shop, heavy lifting, yard work, Social research officer, government), Time (Minutes): 30, Frequency (Times/Week): 7, Weekly Exercise (Minutes/Week): 210, Intensity: Moderate, Exercise limited by: orthopedic condition(s)   Goals Addressed             This Visit's Progress    Prevent falls   On track    Work to complete shop safely while preventing accidents and falls.       Depression  Screen    04/11/2022   10:12 AM 02/01/2022   10:31 AM 08/03/2021   10:37 AM 04/08/2021   10:05 AM 01/28/2021    9:16 AM 05/19/2020  10:07 AM 04/07/2020    9:58 AM  PHQ 2/9 Scores  PHQ - 2 Score 0 0 0 0 0 0 0  PHQ- 9 Score  1         Fall Risk    04/11/2022   10:10 AM 02/01/2022   10:31 AM 08/03/2021   10:36 AM 04/08/2021   10:16 AM 01/28/2021    9:16 AM  Fall Risk   Falls in the past year? 0 0 0 0 0  Number falls in past yr: 0   0   Injury with Fall? 0   0   Risk for fall due to : Orthopedic patient   Orthopedic patient;Impaired vision;Medication side effect   Follow up Falls prevention discussed   Education provided;Falls prevention discussed     FALL RISK PREVENTION PERTAINING TO THE HOME:  Any stairs in or around the home? No  If so, are there any without handrails? No  Home free of loose throw rugs in walkways, pet beds, electrical cords, etc? Yes  Adequate lighting in your home to reduce risk of falls? Yes   ASSISTIVE DEVICES UTILIZED TO PREVENT FALLS:  Life alert? No  Use of a cane, walker or w/c? No  Grab bars in the bathroom? Yes  Shower chair or bench in shower? Yes  Elevated toilet seat or a handicapped toilet? Yes   TIMED UP AND GO:  Was the test performed? No . Telephonic visit  Cognitive Function:    01/24/2018    3:20 PM  MMSE - Mini Mental State Exam  Orientation to time 5  Orientation to Place 5  Registration 3  Attention/ Calculation 5  Recall 3  Language- name 2 objects 2  Language- repeat 1  Language- follow 3 step command 3  Language- read & follow direction 1  Write a sentence 1  Copy design 1  Total score 30        04/11/2022   10:28 AM 04/07/2020    9:34 AM 04/07/2019   11:28 AM  6CIT Screen  What Year? 0 points 0 points 0 points  What month? 0 points 0 points 0 points  What time? 0 points 0 points 0 points  Count back from 20 0 points 0 points 0 points  Months in reverse 0 points 0 points 0 points  Repeat phrase 2 points 0 points 0  points  Total Score 2 points 0 points 0 points    Immunizations Immunization History  Administered Date(s) Administered   Influenza Split 08/02/2013   Influenza, High Dose Seasonal PF 07/26/2016, 11/08/2018   Influenza,inj,Quad PF,6+ Mos 08/19/2014   Influenza-Unspecified 08/08/2019   Moderna Sars-Covid-2 Vaccination 03/15/2020, 04/12/2020   Pneumococcal Conjugate-13 12/31/2013   Pneumococcal Polysaccharide-23 11/29/2016   Td 06/05/2018   Tdap 12/02/2006    TDAP status: Up to date  Flu Vaccine status: Due, Education has been provided regarding the importance of this vaccine. Advised may receive this vaccine at local pharmacy or Health Dept. Aware to provide a copy of the vaccination record if obtained from local pharmacy or Health Dept. Verbalized acceptance and understanding.  Pneumococcal vaccine status: Up to date  Covid-19 vaccine status: Completed vaccines  Qualifies for Shingles Vaccine? Yes   Zostavax completed No   Shingrix Completed?: No.    Education has been provided regarding the importance of this vaccine. Patient has been advised to call insurance company to determine out of pocket expense if they have not yet received this vaccine. Advised may also receive  vaccine at local pharmacy or Health Dept. Verbalized acceptance and understanding.  Screening Tests Health Maintenance  Topic Date Due   COVID-19 Vaccine (3 - Moderna risk series) 05/10/2020   Hepatitis C Screening  08/03/2022 (Originally 10/20/1964)   Zoster Vaccines- Shingrix (1 of 2) 02/02/2023 (Originally 10/21/1965)   INFLUENZA VACCINE  05/30/2022   COLONOSCOPY (Pts 45-20yr Insurance coverage will need to be confirmed)  04/26/2023   TETANUS/TDAP  06/05/2028   Pneumonia Vaccine 76 Years old  Completed   HPV VACCINES  Aged Out    Health Maintenance  Health Maintenance Due  Topic Date Due   COVID-19 Vaccine (3 - Moderna risk series) 05/10/2020    Colorectal cancer screening: Type of screening:  Colonoscopy. Completed 04/25/2013. Repeat every 10 years  Lung Cancer Screening: (Low Dose CT Chest recommended if Age 74-80years, 30 pack-year currently smoking OR have quit w/in 15years.) does not qualify.  Additional Screening:  Hepatitis C Screening: does qualify; DUE  Vision Screening: Recommended annual ophthalmology exams for early detection of glaucoma and other disorders of the eye. Is the patient up to date with their annual eye exam?  Yes  Who is the provider or what is the name of the office in which the patient attends annual eye exams[facility] [place] not established [place] a provider, would they like to be referred to a provider to establish care? No .   Dental Screening: Recommended annual dental exams for proper oral hygiene  Community Resource Referral / Chronic Care Management: CRR required this visit?  No   CCM required this visit?  No      Plan:     I have personally reviewed and noted the following in the patient's chart:   Medical and social history Use of alcohol, tobacco or illicit drugs  Current medications and supplements including opioid prescriptions. Patient is not currently taking opioid prescriptions. Functional ability and status Nutritional status Physical activity Advanced directives List of other physicians Hospitalizations, surgeries, and ER visits in previous 12 months Vitals Screenings to include cognitive, depression, and falls Referrals and appointments  In addition, I have reviewed and discussed with patient certain preventive protocols, quality metrics, and best practice recommendations. A written personalized care plan for preventive services as well as general preventive health recommendations were provided to patient.     ASandrea Hammond LPN   [id number]  Nurse Notes: None

## 2022-04-11 NOTE — Patient Instructions (Signed)
Bradley Wong , Thank you for taking time to come for your Medicare Wellness Visit. I appreciate your ongoing commitment to your health goals. Please review the following plan we discussed and let me know if I can assist you in the future.   Screening recommendations/referrals: Colonoscopy: Done 04/25/2013 - Repeat in 10 years *next year Recommended yearly ophthalmology/optometry visit for glaucoma screening and checkup Recommended yearly dental visit for hygiene and checkup  Vaccinations: Influenza vaccine: Due in Fall - recommended annually Pneumococcal vaccine: Done 12/31/2013 & 11/29/2016    Tdap vaccine: Done 06/05/2018 - Repeat in 10 years Shingles vaccine: Due - Shingrix is 2 doses 2-6 months apart and over 90% effective  *covered by Medicare Part D in 2023   Covid-19: Done 03/15/2020 & 04/12/2020 - declines boosters  Advanced directives: Advance directive discussed with you today. Even though you declined this today, please call our office should you change your mind, and we can give you the proper paperwork for you to fill out.   Conditions/risks identified: Aim for 30 minutes of exercise or brisk walking, 6-8 glasses of water, and 5 servings of fruits and vegetables each day.   Next appointment: Follow up in one year for your annual wellness visit.   Preventive Care 20 Years and Older, Male  Preventive care refers to lifestyle choices and visits with your health care provider that can promote health and wellness. What does preventive care include? A yearly physical exam. This is also called an annual well check. Dental exams once or twice a year. Routine eye exams. Ask your health care provider how often you should have your eyes checked. Personal lifestyle choices, including: Daily care of your teeth and gums. Regular physical activity. Eating a healthy diet. Avoiding tobacco and drug use. Limiting alcohol use. Practicing safe sex. Taking low doses of aspirin every day. Taking  vitamin and mineral supplements as recommended by your health care provider. What happens during an annual well check? The services and screenings done by your health care provider during your annual well check will depend on your age, overall health, lifestyle risk factors, and family history of disease. Counseling  Your health care provider may ask you questions about your: Alcohol use. Tobacco use. Drug use. Emotional well-being. Home and relationship well-being. Sexual activity. Eating habits. History of falls. Memory and ability to understand (cognition). Work and work Statistician. Screening  You may have the following tests or measurements: Height, weight, and BMI. Blood pressure. Lipid and cholesterol levels. These may be checked every 5 years, or more frequently if you are over 30 years old. Skin check. Lung cancer screening. You may have this screening every year starting at age 108 if you have a 30-pack-year history of smoking and currently smoke or have quit within the past 15 years. Fecal occult blood test (FOBT) of the stool. You may have this test every year starting at age 15. Flexible sigmoidoscopy or colonoscopy. You may have a sigmoidoscopy every 5 years or a colonoscopy every 10 years starting at age 2. Prostate cancer screening. Recommendations will vary depending on your family history and other risks. Hepatitis C blood test. Hepatitis B blood test. Sexually transmitted disease (STD) testing. Diabetes screening. This is done by checking your blood sugar (glucose) after you have not eaten for a while (fasting). You may have this done every 1-3 years. Abdominal aortic aneurysm (AAA) screening. You may need this if you are a current or former smoker. Osteoporosis. You may be screened starting at  age 72 if you are at high risk. Talk with your health care provider about your test results, treatment options, and if necessary, the need for more tests. Vaccines  Your  health care provider may recommend certain vaccines, such as: Influenza vaccine. This is recommended every year. Tetanus, diphtheria, and acellular pertussis (Tdap, Td) vaccine. You may need a Td booster every 10 years. Zoster vaccine. You may need this after age 11. Pneumococcal 13-valent conjugate (PCV13) vaccine. One dose is recommended after age 28. Pneumococcal polysaccharide (PPSV23) vaccine. One dose is recommended after age 70. Talk to your health care provider about which screenings and vaccines you need and how often you need them. This information is not intended to replace advice given to you by your health care provider. Make sure you discuss any questions you have with your health care provider. Document Released: 11/12/2015 Document Revised: 07/05/2016 Document Reviewed: 08/17/2015 Elsevier Interactive Patient Education  2017 [place] Prevention in the Home Falls can cause injuries. They can happen to people of all ages. There are many things you can do to make your home safe and to help prevent falls. What can I do on the outside of my home? Regularly fix the edges of walkways and driveways and fix any cracks. Remove anything that might make you trip as you walk through a door, such as a raised step or threshold. Trim any bushes or trees on the path to your home. Use bright outdoor lighting. Clear any walking paths of anything that might make someone trip, such as rocks or tools. Regularly check to see if handrails are loose or broken. Make sure that both sides of any steps have handrails. Any raised decks and porches should have guardrails on the edges. Have any leaves, snow, or ice cleared regularly. Use sand or salt on walking paths during winter. Clean up any spills in your garage right away. This includes oil or grease spills. What can I do in the bathroom? Use night lights. Install grab bars by the toilet and in the tub and shower. Do not use towel bars as  grab bars. Use non-skid mats or decals in the tub or shower. If you need to sit down in the shower, use a plastic, non-slip stool. Keep the floor dry. Clean up any water that spills on the floor as soon as it happens. Remove soap buildup in the tub or shower regularly. Attach bath mats securely with double-sided non-slip rug tape. Do not have throw rugs and other things on the floor that can make you trip. What can I do in the bedroom? Use night lights. Make sure that you have a light by your bed that is easy to reach. Do not use any sheets or blankets that are too big for your bed. They should not hang down onto the floor. Have a firm chair that has side arms. You can use this for support while you get dressed. Do not have throw rugs and other things on the floor that can make you trip. What can I do in the kitchen? Clean up any spills right away. Avoid walking on wet floors. Keep items that you use a lot in easy-to-reach places. If you need to reach something above you, use a strong step stool that has a grab bar. Keep electrical cords out of the way. Do not use floor polish or wax that makes floors slippery. If you must use wax, use non-skid floor wax. Do not have throw rugs and  other things on the floor that can make you trip. What can I do with my stairs? Do not leave any items on the stairs. Make sure that there are handrails on both sides of the stairs and use them. Fix handrails that are broken or loose. Make sure that handrails are as long as the stairways. Check any carpeting to make sure that it is firmly attached to the stairs. Fix any carpet that is loose or worn. Avoid having throw rugs at the top or bottom of the stairs. If you do have throw rugs, attach them to the floor with carpet tape. Make sure that you have a light switch at the top of the stairs and the bottom of the stairs. If you do not have them, ask someone to add them for you. What else can I do to help prevent  falls? Wear shoes that: Do not have high heels. Have rubber bottoms. Are comfortable and fit you well. Are closed at the toe. Do not wear sandals. If you use a stepladder: Make sure that it is fully opened. Do not climb a closed stepladder. Make sure that both sides of the stepladder are locked into place. Ask someone to hold it for you, if possible. Clearly mark and make sure that you can see: Any grab bars or handrails. First and last steps. Where the edge of each step is. Use tools that help you move around (mobility aids) if they are needed. These include: Canes. Walkers. Scooters. Crutches. Turn on the lights when you go into a dark area. Replace any light bulbs as soon as they burn out. Set up your furniture so you have a clear path. Avoid moving your furniture around. If any of your floors are uneven, fix them. If there are any pets around you, be aware of where they are. Review your medicines with your doctor. Some medicines can make you feel dizzy. This can increase your chance of falling. Ask your doctor what other things that you can do to help prevent falls. This information is not intended to replace advice given to you by your health care provider. Make sure you discuss any questions you have with your health care provider. Document Released: 08/12/2009 Document Revised: 03/23/2016 Document Reviewed: 11/20/2014 Elsevier Interactive Patient Education  2017 [employer].

## 2022-06-30 LAB — HM DIABETES EYE EXAM

## 2022-08-01 ENCOUNTER — Ambulatory Visit: Payer: Medicare Other | Attending: Cardiovascular Disease | Admitting: Cardiovascular Disease

## 2022-08-01 ENCOUNTER — Encounter: Payer: Self-pay | Admitting: Cardiovascular Disease

## 2022-08-01 VITALS — BP 120/72 | HR 65 | Ht 73.0 in | Wt 201.0 lb

## 2022-08-01 DIAGNOSIS — I2510 Atherosclerotic heart disease of native coronary artery without angina pectoris: Secondary | ICD-10-CM

## 2022-08-01 DIAGNOSIS — I10 Essential (primary) hypertension: Secondary | ICD-10-CM

## 2022-08-01 DIAGNOSIS — E782 Mixed hyperlipidemia: Secondary | ICD-10-CM

## 2022-08-01 NOTE — Progress Notes (Unsigned)
Cardiology Office Note:    Date:  08/02/2022   ID:  Bradley Wong, DOB 03-10-1946, MRN [id number]  PCP:  Dettinger, Fransisca Kaufmann, MD   [place] Providers Cardiologist:  Sherren Mocha, MD     Referring MD: Dettinger, Fransisca Kaufmann, MD   Chief Complaint  Patient presents with   Annual Exam   Hypertension    History of Present Illness:    Bradley Wong is a 76 y.o. male presenting for follow-up of hypertension and nonobstructive coronary artery disease.  The patient is here alone today.  He is doing well from a cardiac perspective and denies any symptoms of chest pain, chest pressure, shortness of breath, heart palpitations, or leg swelling.  He is compliant with his medications.  He has tolerated antihypertensive medicine and lipid-lowering therapy.  His labs were drawn by his primary care physician in April and he sees them back in the near future for follow-up.  He continues to work and tries to stay busy.  Past Medical History:  Diagnosis Date   Arthritis    Coronary artery disease cardiologist--- dr Burt Knack   per coronary CTA 01-15-2018, mild nonobstructive CAD;   nuclear stress test 08-05-2012 in epic,  showed normal perfusion w/ no ischemia, ef 69%   History of cancer chemotherapy 2000   for thyroid cancer   History of colon polyps    History of kidney stones    History of thyroid cancer 2000   status post total thyroidectomy and completed chemo same year for papillary cancer ;   followed by Dr Raliegh Ip. Hairston (endocrinologist), notes in epic,  no recurrence    HTN (hypertension)    followed by pcp and cardiology   Hyperlipidemia    Hypothyroidism, postsurgical 2000   endocrinologist--- Dr Raliegh Ip. Hairston   Mixed hyperlipidemia    followed by dr Bertel Venard   Pulmonary nodule    chest CT 07-17-2019 in epic   Right ureteral stone    Type 2 diabetes mellitus ([place])    followed by pcp---  (06-07-2020 per pt does not check blood sugar)   Wears glasses    Wears partial  dentures    upper    Past Surgical History:  Procedure Laterality Date   CYSTOSCOPY WITH STENT PLACEMENT Left 09/20/2018   Procedure: CYSTOSCOPY WITH STENT PLACEMENT;  Surgeon: Bjorn Loser, MD;  Location: [facility];  Service: Urology;  Laterality: Left;   CYSTOSCOPY/URETEROSCOPY/HOLMIUM LASER/STENT PLACEMENT Right 06/10/2020   Procedure: CYSTOSCOPY RIGHT RETROGRADE URETEROSCOPY/HOLMIUM LASER/STENT PLACEMENT;  Surgeon: Irine Seal, MD;  [facility];  Service: Urology;  Laterality: Right;   EXTRACORPOREAL SHOCK WAVE LITHOTRIPSY Right 05/27/2020   Procedure: RIGHT EXTRACORPOREAL SHOCK WAVE LITHOTRIPSY (ESWL);  Surgeon: Raynelle Bring, MD;  Location[facility];  Service: Urology;  Laterality: Right;   KNEE SURGERY Right 1979   PROSTATE BIOPSY  01/2013   TONSILLECTOMY  child   TOTAL THYROIDECTOMY Bilateral 2000    Current Medications: Current Meds  Medication Sig   aspirin EC 81 MG tablet Take 81 mg by mouth daily. Swallow whole.   Cholecalciferol (VITAMIN D-3) 5000 UNITS TABS Take 5,000 Units by mouth daily.    levothyroxine (SYNTHROID) 125 MCG tablet Take 1 tablet (125 mcg total) by mouth daily before breakfast.   olmesartan (BENICAR) 5 MG tablet Take 1 tablet (5 mg total) by mouth daily.   rosuvastatin (CRESTOR) 40 MG tablet Take 0.5 tablets (20 mg total) by mouth daily.   tadalafil (CIALIS) 5 MG  tablet Take 5 mg by mouth daily.   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  Tobacco Use   Smoking status: Never   Smokeless tobacco:  Never  Vaping Use   Vaping Use: Never used  Substance and Sexual Activity   Alcohol use: Not Currently   Drug use: Never   Sexual activity: Not on file  Other Topics Concern   Not on file  Social History Narrative   Not on file   Social Determinants of Health   Financial Resource Strain: Low Risk  (04/11/2022)   Overall Financial Resource Strain (CARDIA)    Difficulty of Paying Living Expenses: Not hard at all  Food Insecurity: No Food Insecurity (04/11/2022)   Hunger Vital Sign    Worried About Running Out of Food in the Last Year: Never true    [place] in the Last Year: Never true  Transportation Needs: No Transportation Needs (04/11/2022)   PRAPARE - Hydrologist (Medical): No    Lack of Transportation (Non-Medical): No  Physical Activity: Sufficiently Active (04/11/2022)   Exercise Vital Sign    Days of Exercise per Week: 7 days    Minutes of Exercise per Session: 30 min  Stress: No Stress Concern Present (04/11/2022)   [place]    Feeling of Stress : Not at all  Social Connections: [place] (04/11/2022)   Social Connection and Isolation Panel [NHANES]    Frequency of Communication with Friends and Family: More than three times a week    Frequency of Social Gatherings with Friends and Family: More than three times a week    Attends Religious Services: More than 4 times per year    Active Member of [employer] or Organizations: Yes    Attends Music therapist: More than 4 times per year    Marital Status: Married     Family History: The patient's family history includes Cancer in his paternal grandfather; Cancer (age of onset: 78) in his father; Congestive Heart Failure in his mother; Coronary artery disease (age of onset: 73) in his mother; Diabetes in his brother, brother, and maternal grandmother; Heart disease in his brother and brother; Hypertension in  his mother; Kidney failure in his brother and brother. There is no history of Colon cancer, Rectal cancer, or Stomach cancer.  ROS:   Please see the history of present illness.    All other systems reviewed and are negative.  EKGs/Labs/Other Studies Reviewed:     EKG:  EKG is ordered today.  The ekg ordered today demonstrates normal sinus rhythm 65 bpm, within normal limits.  Recent Labs: 02/01/2022: ALT 18; BUN 15; Creatinine, Ser 1.10; Hemoglobin 14.8; Platelets 140; Potassium 5.0; Sodium 142; TSH 0.133  Recent Lipid Panel    Component Value Date/Time   CHOL 99 (L) 02/01/2022 1056   CHOL 94 04/30/2013 0810   TRIG 26 02/01/2022 1056   TRIG 62 04/18/2017 1050   TRIG 53 04/30/2013 0810  HDL 47 02/01/2022 1056   HDL 44 04/18/2017 1050   HDL 36 (L) 04/30/2013 0810   CHOLHDL 2.1 02/01/2022 1056   LDLCALC 43 02/01/2022 1056   LDLCALC 39 05/12/2014 0841   LDLCALC 47 04/30/2013 0810     Risk Assessment/Calculations:                Physical Exam:    VS:  BP 120/72 (BP Location: Left Arm, Patient Position: Sitting, Cuff Size: Normal)   Pulse 65   Ht '6\' 1"'$  (1.854 m)   Wt 201 lb (91.2 kg)   SpO2 98%   BMI 26.52 kg/m     Wt Readings from Last 3 Encounters:  08/01/22 201 lb (91.2 kg)  04/11/22 194 lb (88 kg)  02/01/22 196 lb (88.9 kg)     GEN:  Well nourished, well developed in no acute distress HEENT: Normal NECK: No JVD; No carotid bruits LYMPHATICS: No lymphadenopathy CARDIAC: RRR, no murmurs, rubs, gallops RESPIRATORY:  Clear to auscultation without rales, wheezing or rhonchi  ABDOMEN: Soft, non-tender, non-distended MUSCULOSKELETAL:  No edema; No deformity  SKIN: Warm and dry NEUROLOGIC:  Alert and oriented x 3 PSYCHIATRIC:  Normal affect   ASSESSMENT:    1. Essential hypertension   2. Mixed hyperlipidemia   3. Coronary artery disease involving native coronary artery of native heart without angina pectoris    PLAN:    In order of problems listed  above:  Blood pressure well controlled on telmisartan.  Continue the same.  Labs reviewed from April 2023 with a creatinine of 1.1 and potassium of 5.0.  Discussed lifestyle modification with focus on weight loss and increased exercise.  His exercise is pretty limited from low back problems. Treated with rosuvastatin 20 mg daily.  Recent lipids with a cholesterol of 99, HDL 47, LDL 43. Nonobstructive CAD by gated coronary CT angiogram.  Treated with low-dose aspirin and a statin drug.  No anginal symptoms.           Medication Adjustments/Labs and Tests Ordered: Current medicines are reviewed at length with the patient today.  Concerns regarding medicines are outlined above.  Orders Placed This Encounter  Procedures   EKG 12-Lead   No orders of the defined types were placed in this encounter.   Patient Instructions  Medication Instructions:  Your physician recommends that you continue on your current medications as directed. Please refer to the Current Medication list given to you today.  *If you need a refill on your cardiac medications before your next appointment, please call your pharmacy*   Lab Work: NONE If you have labs (blood work) drawn today and your tests are completely normal, you will receive your results only by: [place] (if you have MyChart) OR A paper copy in the mail If you have any lab test that is abnormal or we need to change your treatment, we will call you to review the results.   Testing/Procedures: NONE   Follow-Up: At [facility], you and your health needs are our priority.  As part of our continuing mission to provide you with exceptional heart care, we have created designated Provider Care Teams.  These Care Teams include your primary Cardiologist (physician) and Advanced Practice Providers (APPs -  Physician Assistants and Nurse Practitioners) who all work together to provide you with the care you need, when you need it.  We  recommend signing up for the patient portal called "MyChart".  Sign up information is provided on this After  Visit Summary.  MyChart is used to connect with patients for Virtual Visits (Telemedicine).  Patients are able to view lab/test results, encounter notes, upcoming appointments, etc.  Non-urgent messages can be sent to your provider as well.   To learn more about what you can do with MyChart, go to [email or web address].    Your next appointment:   1 year(s)  The format for your next appointment:   In Person  Provider:   Sherren Mocha, MD       Important Information About Sugar         Signed, Sherren Mocha, MD  08/02/2022 11:09 AM    [place]

## 2022-08-01 NOTE — Patient Instructions (Signed)
Medication Instructions:  Your physician recommends that you continue on your current medications as directed. Please refer to the Current Medication list given to you today.  *If you need a refill on your cardiac medications before your next appointment, please call your pharmacy*   Lab Work: NONE If you have labs (blood work) drawn today and your tests are completely normal, you will receive your results only by: MyChart Message (if you have MyChart) OR A paper copy in the mail If you have any lab test that is abnormal or we need to change your treatment, we will call you to review the results.   Testing/Procedures: NONE   Follow-Up: At [facility], you and your health needs are our priority.  As part of our continuing mission to provide you with exceptional heart care, we have created designated Provider Care Teams.  These Care Teams include your primary Cardiologist (physician) and Advanced Practice Providers (APPs -  Physician Assistants and Nurse Practitioners) who all work together to provide you with the care you need, when you need it.  We recommend signing up for the patient portal called "MyChart".  Sign up information is provided on this After Visit Summary.  MyChart is used to connect with patients for Virtual Visits (Telemedicine).  Patients are able to view lab/test results, encounter notes, upcoming appointments, etc.  Non-urgent messages can be sent to your provider as well.   To learn more about what you can do with MyChart, go to [email or web address].    Your next appointment:   1 year(s)  The format for your next appointment:   In Person  Provider:   Michael Cooper, MD       Important Information About Sugar       

## 2022-08-03 ENCOUNTER — Ambulatory Visit (INDEPENDENT_AMBULATORY_CARE_PROVIDER_SITE_OTHER): Payer: Medicare Other | Admitting: Family Medicine

## 2022-08-03 ENCOUNTER — Encounter: Payer: Self-pay | Admitting: Family Medicine

## 2022-08-03 VITALS — BP 103/71 | HR 70 | Temp 98.0°F | Ht 73.0 in | Wt 196.0 lb

## 2022-08-03 DIAGNOSIS — R972 Elevated prostate specific antigen [PSA]: Secondary | ICD-10-CM | POA: Diagnosis not present

## 2022-08-03 DIAGNOSIS — E118 Type 2 diabetes mellitus with unspecified complications: Secondary | ICD-10-CM | POA: Diagnosis not present

## 2022-08-03 DIAGNOSIS — R7303 Prediabetes: Secondary | ICD-10-CM | POA: Diagnosis not present

## 2022-08-03 DIAGNOSIS — Z79899 Other long term (current) drug therapy: Secondary | ICD-10-CM

## 2022-08-03 DIAGNOSIS — I2510 Atherosclerotic heart disease of native coronary artery without angina pectoris: Secondary | ICD-10-CM | POA: Diagnosis not present

## 2022-08-03 DIAGNOSIS — E890 Postprocedural hypothyroidism: Secondary | ICD-10-CM

## 2022-08-03 DIAGNOSIS — M5136 Other intervertebral disc degeneration, lumbar region: Secondary | ICD-10-CM | POA: Diagnosis not present

## 2022-08-03 DIAGNOSIS — I10 Essential (primary) hypertension: Secondary | ICD-10-CM | POA: Diagnosis not present

## 2022-08-03 DIAGNOSIS — E7800 Pure hypercholesterolemia, unspecified: Secondary | ICD-10-CM | POA: Diagnosis not present

## 2022-08-03 DIAGNOSIS — M47816 Spondylosis without myelopathy or radiculopathy, lumbar region: Secondary | ICD-10-CM

## 2022-08-03 LAB — BAYER DCA HB A1C WAIVED: HB A1C (BAYER DCA - WAIVED): 6.1 % — ABNORMAL HIGH (ref 4.8–5.6)

## 2022-08-03 MED ORDER — TAMSULOSIN HCL 0.4 MG PO CAPS: ORAL_CAPSULE | ORAL | 3 refills | Status: DC

## 2022-08-03 MED ORDER — HYDROCODONE-ACETAMINOPHEN 10-325 MG PO TABS: 0.5000 | ORAL_TABLET | Freq: Every day | ORAL | 0 refills | Status: DC | PRN

## 2022-08-03 NOTE — Progress Notes (Signed)
BP 103/71   Pulse 70   Temp 98 F (36.7 C)   Ht '6\' 1"'  (1.854 m)   Wt 196 lb (88.9 kg)   SpO2 98%   BMI 25.86 kg/m    Subjective:   Patient ID: Bradley Wong, male    DOB: [birth year], 76 y.o.   MRN: [id number]  HPI: Bradley Wong is a 76 y.o. male presenting on 08/03/2022 for Medical Management of Chronic Issues, Hypertension, Diabetes, and Back Pain (Would like new Rx for Hydrocodone- has an old Rx from Dr. Laurance Flatten. Pt takes 1/2 tab as needed but very rare)   HPI Pain assessment: Cause of pain-degenerative disc disease and lumbar spondylosis Pain location-lower back Pain on scale of 1-10- 2 Frequency-very infrequently. What increases pain-certain movements randomly or sometimes weather changes What makes pain Better-stretching and rest and the occasional hydrocodone Effects on ADL -none Any change in general medical condition-none  Current opioids rx-hydrocodone-acetaminophen 10-3 25, takes half a tablet very infrequently as needed. # meds rx-30/month Effectiveness of current meds-works well Adverse reactions from pain meds-none, uses very infrequently Morphine equivalent-12-1/2  Pill count performed-No Last drug screen -N/A ( high risk q31m moderate risk q669mlow risk yearly ) Urine drug screen today- Yes Was the [place]-yes  If yes were their any concerning findings? -None  Pain contract signed on: Today  Hypertension Patient is currently on Olmesartan, and their blood pressure today is 103/71. Patient denies any lightheadedness or dizziness. Patient denies headaches, blurred vision, chest pains, shortness of breath, or weakness. Denies any side effects from medication and is content with current medication.   Hyperlipidemia Patient is coming in for recheck of his hyperlipidemia. The patient is currently taking Crestor. They deny any issues with myalgias or history of liver damage from it. They deny any focal numbness or weakness or chest pain.    Prediabetes Patient comes in today for recheck of his diabetes. Patient has been currently taking no medication. Patient is currently on an ACE inhibitor/ARB. Patient has seen an ophthalmologist this year. Patient denies any issues with their feet. The symptom started onset as an adult hypertension and hyperlipidemia ARE RELATED TO DM   Relevant past medical, surgical, family and social history reviewed and updated as indicated. Interim medical history since our last visit reviewed. Allergies and medications reviewed and updated.  Review of Systems  Constitutional:  Negative for chills and fever.  Eyes:  Negative for visual disturbance.  Respiratory:  Negative for shortness of breath and wheezing.   Cardiovascular:  Negative for chest pain and leg swelling.  Musculoskeletal:  Positive for arthralgias and back pain. Negative for gait problem.  Skin:  Negative for rash.  Neurological:  Negative for dizziness, weakness and light-headedness.  All other systems reviewed and are negative.   Per HPI unless specifically indicated above   Allergies as of 08/03/2022       Reactions   Niaspan [niacin Er] Other (See Comments)   flushing   Penicillins Rash   Has patient had a PCN reaction causing immediate rash, facial/tongue/throat swelling, SOB or lightheadedness with hypotension: No Has patient had a PCN reaction causing severe rash involving mucus membranes or skin necrosis: No Has patient had a PCN reaction that required hospitalization: Unknown Has patient had a PCN reaction occurring within the last 10 years: No If all of the above answers are "NO", then may proceed with Cephalosporin use.        Medication List  Accurate as of August 03, 2022 11:29 AM. If you have any questions, ask your nurse or doctor.          aspirin EC 81 MG tablet Take 81 mg by mouth daily. Swallow whole.   HYDROcodone-acetaminophen 10-325 MG tablet Commonly known as: NORCO Take 0.5 tablets  by mouth daily as needed for severe pain. Take 1/2 tablets prn What changed:  how much to take when to take this reasons to take this Changed by: Worthy Rancher, MD   levothyroxine 125 MCG tablet Commonly known as: SYNTHROID Take 1 tablet (125 mcg total) by mouth daily before breakfast.   olmesartan 5 MG tablet Commonly known as: BENICAR Take 1 tablet (5 mg total) by mouth daily.   rosuvastatin 40 MG tablet Commonly known as: CRESTOR Take 0.5 tablets (20 mg total) by mouth daily.   tadalafil 5 MG tablet Commonly known as: CIALIS Take 5 mg by mouth daily.   tamsulosin 0.4 MG Caps capsule Commonly known as: FLOMAX TAKE (1) CAPSULE DAILY   Vitamin D-3 125 MCG (5000 UT) Tabs Take 5,000 Units by mouth daily.         Objective:   BP 103/71   Pulse 70   Temp 98 F (36.7 C)   Ht '6\' 1"'  (1.854 m)   Wt 196 lb (88.9 kg)   SpO2 98%   BMI 25.86 kg/m   Wt Readings from Last 3 Encounters:  08/03/22 196 lb (88.9 kg)  08/01/22 201 lb (91.2 kg)  04/11/22 194 lb (88 kg)    Physical Exam Vitals and nursing note reviewed.  Constitutional:      General: He is not in acute distress.    Appearance: He is well-developed. He is not diaphoretic.  Eyes:     General: No scleral icterus.    Conjunctiva/sclera: Conjunctivae normal.  Neck:     Thyroid: No thyromegaly.  Cardiovascular:     Rate and Rhythm: Normal rate and regular rhythm.     Heart sounds: Normal heart sounds. No murmur heard. Pulmonary:     Effort: Pulmonary effort is normal. No respiratory distress.     Breath sounds: Normal breath sounds. No wheezing.  Musculoskeletal:        General: No swelling or tenderness (No pain today on exam, he says its not every day that he gets the pain).     Cervical back: Neck supple.  Lymphadenopathy:     Cervical: No cervical adenopathy.  Skin:    General: Skin is warm and dry.     Findings: No rash.  Neurological:     Mental Status: He is alert and oriented to person,  place, and time.     Coordination: Coordination normal.  Psychiatric:        Behavior: Behavior normal.       Assessment & Plan:   Problem List Items Addressed This Visit       Cardiovascular and Mediastinum   Essential hypertension, benign - Primary (Chronic)   Relevant Orders   CBC with Differential/Platelet   CMP14+EGFR   Lipid panel   TSH   Bayer DCA Hb A1c Waived     Endocrine   Hypothyroidism, postsurgical   Relevant Orders   CBC with Differential/Platelet   CMP14+EGFR   Lipid panel   TSH   Bayer DCA Hb A1c Waived     Musculoskeletal and Integument   Lumbar spondylosis   Relevant Medications   HYDROcodone-acetaminophen (NORCO) 10-325 MG tablet   DDD (degenerative  disc disease), lumbar   Relevant Medications   HYDROcodone-acetaminophen (NORCO) 10-325 MG tablet     Other   Hyperlipemia   Relevant Orders   CBC with Differential/Platelet   CMP14+EGFR   Lipid panel   TSH   Bayer DCA Hb A1c Waived   Pre-diabetes   Elevated PSA   Relevant Orders   PSA, total and free   Other Visit Diagnoses     Controlled type 2 diabetes mellitus with complication, without long-term current use of insulin ([place])       Relevant Orders   CBC with Differential/Platelet   CMP14+EGFR   Lipid panel   TSH   Bayer DCA Hb A1c Waived   Microalbumin / creatinine urine ratio   Controlled substance agreement signed       Relevant Orders   ToxASSURE Select 13 (MW), Urine       A1c looks good at 6.1.  No changes.  Continue current medicine.  We will check the rest of his blood work.  Give a short course of hydrocodone because he uses it very infrequently we will do drug screen today. Follow up plan: Return in about 6 months (around 02/02/2023), or if symptoms worsen or fail to improve, for Prediabetes hypertension and cholesterol and thyroid.  Counseling provided for all of the vaccine components Orders Placed This Encounter  Procedures   CBC with Differential/Platelet    CMP14+EGFR   Lipid panel   TSH   Bayer DCA Hb A1c Waived   PSA, total and free   Microalbumin / creatinine urine ratio   ToxASSURE Select 13 (MW), Urine    Caryl Pina, MD [facility] 08/03/2022, 11:29 AM

## 2022-08-04 LAB — CBC WITH DIFFERENTIAL/PLATELET
Basophils Absolute: 0 10*3/uL (ref 0.0–0.2)
Basos: 1 %
EOS (ABSOLUTE): 0.1 10*3/uL (ref 0.0–0.4)
Eos: 2 %
Hematocrit: 44.4 % (ref 37.5–51.0)
Hemoglobin: 14.9 g/dL (ref 13.0–17.7)
Immature Grans (Abs): 0 10*3/uL (ref 0.0–0.1)
Immature Granulocytes: 0 %
Lymphocytes Absolute: 1.4 10*3/uL (ref 0.7–3.1)
Lymphs: 27 %
MCH: 32.6 pg (ref 26.6–33.0)
MCHC: 33.6 g/dL (ref 31.5–35.7)
MCV: 97 fL (ref 79–97)
Monocytes Absolute: 0.3 10*3/uL (ref 0.1–0.9)
Monocytes: 7 %
Neutrophils Absolute: 3.3 10*3/uL (ref 1.4–7.0)
Neutrophils: 63 %
Platelets: 139 10*3/uL — ABNORMAL LOW (ref 150–450)
RBC: 4.57 x10E6/uL (ref 4.14–5.80)
RDW: 12.8 % (ref 11.6–15.4)
WBC: 5.2 10*3/uL (ref 3.4–10.8)

## 2022-08-04 LAB — CMP14+EGFR
ALT: 18 IU/L (ref 0–44)
AST: 15 IU/L (ref 0–40)
Albumin/Globulin Ratio: 2.6 — ABNORMAL HIGH (ref 1.2–2.2)
Albumin: 4.6 g/dL (ref 3.8–4.8)
Alkaline Phosphatase: 62 IU/L (ref 44–121)
BUN/Creatinine Ratio: 25 — ABNORMAL HIGH (ref 10–24)
BUN: 25 mg/dL (ref 8–27)
Bilirubin Total: 0.9 mg/dL (ref 0.0–1.2)
CO2: 20 mmol/L (ref 20–29)
Calcium: 9.4 mg/dL (ref 8.6–10.2)
Chloride: 106 mmol/L (ref 96–106)
Creatinine, Ser: 1.01 mg/dL (ref 0.76–1.27)
Globulin, Total: 1.8 g/dL (ref 1.5–4.5)
Glucose: 118 mg/dL — ABNORMAL HIGH (ref 70–99)
Potassium: 4.8 mmol/L (ref 3.5–5.2)
Sodium: 141 mmol/L (ref 134–144)
Total Protein: 6.4 g/dL (ref 6.0–8.5)
eGFR: 79 mL/min/{1.73_m2} (ref >59)

## 2022-08-04 LAB — LIPID PANEL
Chol/HDL Ratio: 2.2 ratio (ref 0.0–5.0)
Cholesterol, Total: 103 mg/dL (ref 100–199)
HDL: 46 mg/dL (ref >39)
LDL Chol Calc (NIH): 43 mg/dL (ref 0–99)
Triglycerides: 62 mg/dL (ref 0–149)
VLDL Cholesterol Cal: 14 mg/dL (ref 5–40)

## 2022-08-04 LAB — MICROALBUMIN / CREATININE URINE RATIO
Creatinine, Urine: 138.7 mg/dL
Microalb/Creat Ratio: 4 mg/g creat (ref 0–29)
Microalbumin, Urine: 5.1 ug/mL

## 2022-08-04 LAB — PSA, TOTAL AND FREE
PSA, Free Pct: 18 %
PSA, Free: 0.99 ng/mL
Prostate Specific Ag, Serum: 5.5 ng/mL — ABNORMAL HIGH (ref 0.0–4.0)

## 2022-08-04 LAB — TSH: TSH: 0.314 u[IU]/mL — ABNORMAL LOW (ref 0.450–4.500)

## 2022-08-07 LAB — TOXASSURE SELECT 13 (MW), URINE

## 2022-08-22 DIAGNOSIS — E890 Postprocedural hypothyroidism: Secondary | ICD-10-CM | POA: Diagnosis not present

## 2022-08-22 DIAGNOSIS — C73 Malignant neoplasm of thyroid gland: Secondary | ICD-10-CM | POA: Diagnosis not present

## 2022-08-22 DIAGNOSIS — Z79890 Hormone replacement therapy (postmenopausal): Secondary | ICD-10-CM | POA: Diagnosis not present

## 2022-08-22 DIAGNOSIS — Z85850 Personal history of malignant neoplasm of thyroid: Secondary | ICD-10-CM | POA: Diagnosis not present

## 2023-01-10 DIAGNOSIS — N401 Enlarged prostate with lower urinary tract symptoms: Secondary | ICD-10-CM | POA: Diagnosis not present

## 2023-01-10 DIAGNOSIS — R3915 Urgency of urination: Secondary | ICD-10-CM | POA: Diagnosis not present

## 2023-01-10 DIAGNOSIS — Z87442 Personal history of urinary calculi: Secondary | ICD-10-CM | POA: Diagnosis not present

## 2023-01-10 DIAGNOSIS — R972 Elevated prostate specific antigen [PSA]: Secondary | ICD-10-CM | POA: Diagnosis not present

## 2023-01-10 DIAGNOSIS — R351 Nocturia: Secondary | ICD-10-CM | POA: Diagnosis not present

## 2023-02-02 ENCOUNTER — Ambulatory Visit (INDEPENDENT_AMBULATORY_CARE_PROVIDER_SITE_OTHER): Payer: Medicare Other | Admitting: Family Medicine

## 2023-02-02 ENCOUNTER — Encounter: Payer: Self-pay | Admitting: Family Medicine

## 2023-02-02 VITALS — BP 133/76 | HR 63 | Ht 73.0 in | Wt 208.0 lb

## 2023-02-02 DIAGNOSIS — Z85850 Personal history of malignant neoplasm of thyroid: Secondary | ICD-10-CM | POA: Diagnosis not present

## 2023-02-02 DIAGNOSIS — E890 Postprocedural hypothyroidism: Secondary | ICD-10-CM

## 2023-02-02 DIAGNOSIS — R7303 Prediabetes: Secondary | ICD-10-CM | POA: Diagnosis not present

## 2023-02-02 DIAGNOSIS — E7800 Pure hypercholesterolemia, unspecified: Secondary | ICD-10-CM

## 2023-02-02 DIAGNOSIS — I10 Essential (primary) hypertension: Secondary | ICD-10-CM

## 2023-02-02 LAB — BAYER DCA HB A1C WAIVED: HB A1C (BAYER DCA - WAIVED): 6 % — ABNORMAL HIGH (ref 4.8–5.6)

## 2023-02-02 LAB — CMP14+EGFR
Chloride: 108 mmol/L — ABNORMAL HIGH (ref 96–106)
Potassium: 4.9 mmol/L (ref 3.5–5.2)

## 2023-02-02 LAB — LIPID PANEL

## 2023-02-02 NOTE — Progress Notes (Signed)
BP 133/76   Pulse 63   Ht 6\' 1"  (1.854 m)   Wt 208 lb (94.3 kg)   SpO2 95%   BMI 27.44 kg/m    Subjective:   Patient ID: Bradley Wong, male    DOB: December 07, 1945, 77 y.o.   MRN: [id number]  HPI: Bradley Wong is a 77 y.o. male presenting on 02/02/2023 for Medical Management of Chronic Issues, Hypertension, and Prediabetes   HPI Prediabetes Patient comes in today for recheck of his diabetes. Patient has been currently taking no medication currently, diet although he does admit that he is gained some weight back and his diet has not been as good.. Patient is currently on an ACE inhibitor/ARB. Patient has seen an ophthalmologist this year. Patient denies any new issues with their feet. The symptom started onset as an adult hypertension and hyperlipidemia and hypothyroidism ARE RELATED TO DM   Hypertension Patient is currently on olmesartan, and their blood pressure today is 133/76. Patient denies any lightheadedness or dizziness. Patient denies headaches, blurred vision, chest pains, shortness of breath, or weakness. Denies any side effects from medication and is content with current medication.   Hyperlipidemia Patient is coming in for recheck of his hyperlipidemia. The patient is currently taking Crestor. They deny any issues with myalgias or history of liver damage from it. They deny any focal numbness or weakness or chest pain.   Hypothyroidism recheck secondary to postsurgical thyroid cancer Patient is coming in for thyroid recheck today as well. They deny any issues with hair changes or heat or cold problems or diarrhea or constipation. They deny any chest pain or palpitations. They are currently on levothyroxine 125 micrograms   Relevant past medical, surgical, family and social history reviewed and updated as indicated. Interim medical history since our last visit reviewed. Allergies and medications reviewed and updated.  Review of Systems  Constitutional:  Negative for  chills and fever.  Eyes:  Negative for visual disturbance.  Respiratory:  Negative for shortness of breath and wheezing.   Cardiovascular:  Negative for chest pain and leg swelling.  Musculoskeletal:  Negative for back pain and gait problem.  Skin:  Negative for rash.  Neurological:  Negative for dizziness, weakness and light-headedness.  All other systems reviewed and are negative.   Per HPI unless specifically indicated above   Allergies as of 02/02/2023       Reactions   Niaspan [niacin Er] Other (See Comments)   flushing   Penicillins Rash   Has patient had a PCN reaction causing immediate rash, facial/tongue/throat swelling, SOB or lightheadedness with hypotension: No Has patient had a PCN reaction causing severe rash involving mucus membranes or skin necrosis: No Has patient had a PCN reaction that required hospitalization: Unknown Has patient had a PCN reaction occurring within the last 10 years: No If all of the above answers are "NO", then may proceed with Cephalosporin use.        Medication List        Accurate as of February 02, 2023 11:28 AM. If you have any questions, ask your nurse or doctor.          aspirin EC 81 MG tablet Take 81 mg by mouth daily. Swallow whole.   HYDROcodone-acetaminophen 10-325 MG tablet Commonly known as: NORCO Take 0.5 tablets by mouth daily as needed for severe pain. Take 1/2 tablets prn   levothyroxine 125 MCG tablet Commonly known as: SYNTHROID Take 1 tablet (125 mcg total) by mouth daily  before breakfast.   olmesartan 5 MG tablet Commonly known as: BENICAR Take 1 tablet (5 mg total) by mouth daily.   rosuvastatin 40 MG tablet Commonly known as: CRESTOR Take 0.5 tablets (20 mg total) by mouth daily.   tadalafil 5 MG tablet Commonly known as: CIALIS Take 5 mg by mouth daily.   tamsulosin 0.4 MG Caps capsule Commonly known as: FLOMAX TAKE (1) CAPSULE DAILY What changed:  how much to take how to take this when to  take this additional instructions   Vitamin D-3 125 MCG (5000 UT) Tabs Take 5,000 Units by mouth daily.         Objective:   BP 133/76   Pulse 63   Ht 6\' 1"  (1.854 m)   Wt 208 lb (94.3 kg)   SpO2 95%   BMI 27.44 kg/m   Wt Readings from Last 3 Encounters:  02/02/23 208 lb (94.3 kg)  08/03/22 196 lb (88.9 kg)  08/01/22 201 lb (91.2 kg)    Physical Exam Vitals and nursing note reviewed.  Constitutional:      General: He is not in acute distress.    Appearance: He is well-developed. He is not diaphoretic.  Eyes:     General: No scleral icterus.    Conjunctiva/sclera: Conjunctivae normal.  Neck:     Thyroid: No thyromegaly.  Cardiovascular:     Rate and Rhythm: Normal rate and regular rhythm.     Heart sounds: Normal heart sounds. No murmur heard. Pulmonary:     Effort: Pulmonary effort is normal. No respiratory distress.     Breath sounds: Normal breath sounds. No wheezing.  Musculoskeletal:        General: No swelling. Normal range of motion.     Cervical back: Neck supple.  Lymphadenopathy:     Cervical: No cervical adenopathy.  Skin:    General: Skin is warm and dry.     Findings: No rash.  Neurological:     Mental Status: He is alert and oriented to person, place, and time.     Coordination: Coordination normal.  Psychiatric:        Behavior: Behavior normal.     Results for orders placed or performed in visit on 08/04/22  HM DIABETES EYE EXAM  Result Value Ref Range   HM Diabetic Eye Exam No Retinopathy No Retinopathy    Assessment & Plan:   Problem List Items Addressed This Visit       Cardiovascular and Mediastinum   Essential hypertension, benign (Chronic)   Relevant Orders   CBC with Differential/Platelet   CMP14+EGFR     Endocrine   Hypothyroidism, postsurgical - Primary   Relevant Orders   CBC with Differential/Platelet   TSH     Other   Hyperlipemia   Relevant Orders   CBC with Differential/Platelet   CMP14+EGFR   Lipid  panel   Pre-diabetes   Relevant Orders   CBC with Differential/Platelet   Bayer DCA Hb A1c Waived   History of thyroid cancer   Relevant Orders   CBC with Differential/Platelet   TSH    Will check A1c and other blood work today, blood pressure looks decent, he is going to see an eye doctor for his blurred vision and has an appointment in May.  Recheck thyroid levels as well today. Follow up plan: Return in about 6 months (around 08/04/2023), or if symptoms worsen or fail to improve, for Hypertension and hypothyroidism and hyperlipidemia and prediabetes.  Counseling provided for all  of the vaccine components Orders Placed This Encounter  Procedures   CBC with Differential/Platelet   CMP14+EGFR   Lipid panel   Bayer DCA Hb A1c Waived   TSH    Arville CareJoshua Micahel Omlor, MD [facility] 02/02/2023, 11:28 AM

## 2023-02-03 LAB — CMP14+EGFR
ALT: 21 IU/L (ref 0–44)
Albumin/Globulin Ratio: 2.6 — ABNORMAL HIGH (ref 1.2–2.2)
Albumin: 4.4 g/dL (ref 3.8–4.8)
BUN/Creatinine Ratio: 14 (ref 10–24)
Calcium: 9.3 mg/dL (ref 8.6–10.2)
Creatinine, Ser: 0.99 mg/dL (ref 0.76–1.27)
Globulin, Total: 1.7 g/dL (ref 1.5–4.5)
Glucose: 102 mg/dL — ABNORMAL HIGH (ref 70–99)
Sodium: 141 mmol/L (ref 134–144)
eGFR: 80 mL/min/{1.73_m2} (ref >59)

## 2023-02-03 LAB — CBC WITH DIFFERENTIAL/PLATELET
Basophils Absolute: 0 10*3/uL (ref 0.0–0.2)
Basos: 1 %
EOS (ABSOLUTE): 0.1 10*3/uL (ref 0.0–0.4)
Eos: 2 %
Hematocrit: 43.6 % (ref 37.5–51.0)
Hemoglobin: 14.7 g/dL (ref 13.0–17.7)
Immature Grans (Abs): 0 10*3/uL (ref 0.0–0.1)
Immature Granulocytes: 0 %
Lymphocytes Absolute: 1.4 10*3/uL (ref 0.7–3.1)
Lymphs: 26 %
MCH: 32.9 pg (ref 26.6–33.0)
MCHC: 33.7 g/dL (ref 31.5–35.7)
MCV: 98 fL — ABNORMAL HIGH (ref 79–97)
Monocytes Absolute: 0.4 10*3/uL (ref 0.1–0.9)
Monocytes: 7 %
Neutrophils Absolute: 3.2 10*3/uL (ref 1.4–7.0)
Neutrophils: 64 %
Platelets: 149 10*3/uL — ABNORMAL LOW (ref 150–450)
RBC: 4.47 x10E6/uL (ref 4.14–5.80)
RDW: 12.9 % (ref 11.6–15.4)
WBC: 5.1 10*3/uL (ref 3.4–10.8)

## 2023-02-03 LAB — LIPID PANEL
Chol/HDL Ratio: 2.1 ratio (ref 0.0–5.0)
HDL: 43 mg/dL (ref >39)
VLDL Cholesterol Cal: 14 mg/dL (ref 5–40)

## 2023-02-03 LAB — TSH: TSH: 1.05 u[IU]/mL (ref 0.450–4.500)

## 2023-02-12 ENCOUNTER — Other Ambulatory Visit: Payer: Self-pay | Admitting: Family Medicine

## 2023-02-12 DIAGNOSIS — E118 Type 2 diabetes mellitus with unspecified complications: Secondary | ICD-10-CM

## 2023-02-12 DIAGNOSIS — I10 Essential (primary) hypertension: Secondary | ICD-10-CM

## 2023-02-19 DIAGNOSIS — M5416 Radiculopathy, lumbar region: Secondary | ICD-10-CM | POA: Diagnosis not present

## 2023-03-06 DIAGNOSIS — M48061 Spinal stenosis, lumbar region without neurogenic claudication: Secondary | ICD-10-CM | POA: Diagnosis not present

## 2023-03-06 DIAGNOSIS — Z135 Encounter for screening for eye and ear disorders: Secondary | ICD-10-CM | POA: Diagnosis not present

## 2023-03-06 DIAGNOSIS — M5416 Radiculopathy, lumbar region: Secondary | ICD-10-CM | POA: Diagnosis not present

## 2023-03-16 ENCOUNTER — Other Ambulatory Visit: Payer: Self-pay | Admitting: Family Medicine

## 2023-03-23 DIAGNOSIS — M5416 Radiculopathy, lumbar region: Secondary | ICD-10-CM | POA: Diagnosis not present

## 2023-03-28 DIAGNOSIS — H2513 Age-related nuclear cataract, bilateral: Secondary | ICD-10-CM | POA: Diagnosis not present

## 2023-04-04 DIAGNOSIS — M5416 Radiculopathy, lumbar region: Secondary | ICD-10-CM | POA: Diagnosis not present

## 2023-04-13 ENCOUNTER — Ambulatory Visit (INDEPENDENT_AMBULATORY_CARE_PROVIDER_SITE_OTHER): Payer: Medicare Other

## 2023-04-13 VITALS — Ht 73.0 in | Wt 207.0 lb

## 2023-04-13 DIAGNOSIS — Z0000 Encounter for general adult medical examination without abnormal findings: Secondary | ICD-10-CM

## 2023-04-13 DIAGNOSIS — Z1211 Encounter for screening for malignant neoplasm of colon: Secondary | ICD-10-CM

## 2023-04-13 NOTE — Progress Notes (Signed)
Subjective:   Bradley Wong is a 77 y.o. male who presents for Medicare Annual/Subsequent preventive examination. I connected with  TAKUMI SOWERBY on 04/13/23 by a audio enabled telemedicine application and verified that I am speaking with the correct person using two identifiers.  Patient Location: Home  Provider Location: Home Office  I discussed the limitations of evaluation and management by telemedicine. The patient expressed understanding and agreed to proceed.  Review of Systems     Cardiac Risk Factors include: advanced age (>67men, >54 women);male gender;diabetes mellitus;dyslipidemia;hypertension     Objective:    Today's Vitals   04/13/23 0814  Weight: 207 lb (93.9 kg)  Height: 6\' 1"  (1.854 m)   Body mass index is 27.31 kg/m.     04/13/2023    8:18 AM 04/11/2022   10:13 AM 04/08/2021   10:16 AM 06/10/2020    9:00 AM 04/07/2020    9:30 AM 04/07/2019   11:31 AM 09/30/2018    8:01 AM  Advanced Directives  Does Patient Have a Medical Advance Directive? No No No No Yes No Yes  Type of Advance Directive     Living will;Healthcare Power of [employer] Power of [place];Living will  Does patient want to make changes to medical advance directive?       No - Patient declined  Copy of Healthcare Power of Attorney in Chart?       No - copy requested  Would patient like information on creating a medical advance directive? No - Patient declined No - Patient declined Yes (MAU/Ambulatory/Procedural Areas - Information given) No - Patient declined  Yes (MAU/Ambulatory/Procedural Areas - Information given)     Current Medications (verified) Outpatient Encounter Medications as of 04/13/2023  Medication Sig   aspirin EC 81 MG tablet Take 81 mg by mouth daily. Swallow whole.   Cholecalciferol (VITAMIN D-3) 5000 UNITS TABS Take 5,000 Units by mouth daily.    gabapentin (NEURONTIN) 300 MG capsule Take 300 mg by mouth 3 (three) times daily.   HYDROcodone-acetaminophen  (NORCO) 10-325 MG tablet Take 0.5 tablets by mouth daily as needed for severe pain. Take 1/2 tablets prn   levothyroxine (SYNTHROID) 125 MCG tablet Take 1 tablet (125 mcg total) by mouth daily before breakfast.   olmesartan (BENICAR) 5 MG tablet TAKE ONE TABLET ONCE DAILY   rosuvastatin (CRESTOR) 40 MG tablet TAKE 1/2 TABLET DAILY   tadalafil (CIALIS) 5 MG tablet Take 5 mg by mouth daily.   tamsulosin (FLOMAX) 0.4 MG CAPS capsule TAKE (1) CAPSULE DAILY (Patient taking differently: Take 0.4 mg by mouth daily. TAKE (2) CAPSULE DAILY)   No facility-administered encounter medications on file as of 04/13/2023.    Allergies (verified) Niaspan [niacin er] and Penicillins   History: Past Medical History:  Diagnosis Date   Arthritis    Coronary artery disease cardiologist--- dr Excell Seltzer   per coronary CTA 01-15-2018, mild nonobstructive CAD;   nuclear stress test 08-05-2012 in epic,  showed normal perfusion w/ no ischemia, ef 69%   History of cancer chemotherapy 2000   for thyroid cancer   History of colon polyps    History of kidney stones    History of thyroid cancer 2000   status post total thyroidectomy and completed chemo same year for papillary cancer ;   followed by Dr Kirtland Bouchard. Hairston (endocrinologist), notes in epic,  no recurrence    HTN (hypertension)    followed by pcp and cardiology   Hyperlipidemia    Hypothyroidism, postsurgical  2000   endocrinologist--- Dr Kirtland Bouchard. Ha[place]on   Mixed hyperlipidemia    followed by dr cooper   Pulmonary nodule    chest CT 07-17-2019 in epic   Right ureteral stone    Type 2 diabetes mellitus (HCC)    followed by pcp---  (06-07-2020 per pt does not check blood sugar)   Wears glasses    Wears partial dentures    upper   Past Surgical History:  Procedure Laterality Date   CYSTOSCOPY WITH STENT PLACEMENT Left 09/20/2018   Procedure: CYSTOSCOPY WITH STENT PLACEMENT;  Surgeon: Alfredo Martinez, MD;  Location: [facility];  Service: Urology;  Laterality: Left;    CYSTOSCOPY/URETEROSCOPY/HOLMIUM LASER/STENT PLACEMENT Right 06/10/2020   Procedure: CYSTOSCOPY RIGHT RETROGRADE URETEROSCOPY/HOLMIUM LASER/STENT PLACEMENT;  Surgeon: Bjorn Pippin, MD;  Location[facility];  Service: Urology;  Laterality: Right;   EXTRACORPOREAL SHOCK WAVE LITHOTRIPSY Right 05/27/2020   Procedure: RIGHT EXTRACORPOREAL SHOCK WAVE LITHOTRIPSY (ESWL);  Surgeon: Heloise Purpura, MD;  Location: [facility];  Service: Urology;  Laterality: Right;   KNEE SURGERY Right 1979   PROSTATE BIOPSY  01/2013   TONSILLECTOMY  child   TOTAL THYROIDECTOMY Bilateral 2000   Family History  Problem Relation Age of Onset   Coronary artery disease Mother 47   Hypertension Mother    Congestive Heart Failure Mother    Cancer Father 28       throat and bone cancer   Diabetes Brother        deceased   Heart disease Brother    Kidney failure Brother    Diabetes Maternal Grandmother    Cancer Paternal Grandfather        leukemia   Diabetes Brother    Heart disease Brother    Kidney failure Brother    Colon cancer Neg Hx    Rectal cancer Neg Hx    Stomach cancer Neg Hx    Social History   Socioeconomic History   Marital status: Married    Spouse name: Dondra Spry   Number of children: 4   Years of education: 14   Highest education level: Associate degree: occupational, Scientist, product/process development, or vocational program  Occupational History   Occupation: retired    Associate Professor: Nurse, adult [place]    Comment: Lobbyist work  Tobacco Use   Smoking status: Never   Smokeless tobacco: Never  Vaping Use   Vaping Use: Never used  Substance and Sexual Activity   Alcohol use: Not Currently   Drug use: Never   Sexual activity: Not on file  Other Topics Concern   Not on file  Social History Narrative   Not on file   Social Determinants of Health   Financial Resource Strain: Low Risk  (04/13/2023)   Overall Financial Resource Strain (CARDIA)    Difficulty of Paying Living Expenses:  Not hard at all  Food Insecurity: No Food Insecurity (04/13/2023)   Hunger Vital Sign    Worried About Running Out of Food in the Last Year: Never true    Ran Out of Food in the Last Year: Never true  Transportation Needs: No Transportation Needs (04/13/2023)   PRAPARE - Administrator, Civil Service (Medical): No    Lack of Transportation (Non-Medical): No  Physical Activity: Insufficiently Active (04/13/2023)   Exercise Vital Sign    Days of Exercise per Week: 3 days    Minutes of Exercise per Session: 30 min  Stress: No Stress Concern Present (04/13/2023)   [employer] of Occupational  Health - Occupational Stress Questionnaire    Feeling of Stress : Not at all  Social Connections: Moderately Integrated (04/13/2023)   Social Connection and Isolation Panel [NHANES]    Frequency of Communication with Friends and Family: More than three times a week    Frequency of Social Gatherings with Friends and Family: More than three times a week    Attends Religious Services: More than 4 times per year    Active Member of [employer] or Organizations: No    Attends Engineer, structural: Never    Marital Status: Married    Tobacco Counseling Counseling given: Not Answered   Clinical Intake:  Pre-visit preparation completed: Yes  Pain : No/denies pain     Nutritional Risks: None Diabetes: Yes CBG done?: No Did pt. bring in CBG monitor from home?: No  How often do you need to have someone help you when you read instructions, pamphlets, or other written materials from your doctor or pharmacy?: 1 - Never  Diabetic?yes  Nutrition Risk Assessment:  Has the patient had any N/V/D within the last 2 months?  No  Does the patient have any non-healing wounds?  No  Has the patient had any unintentional weight loss or weight gain?  No   Diabetes:  Is the patient diabetic?  Yes  If diabetic, was a CBG obtained today?  No  Did the patient bring in their glucometer from  home?  No  How often do you monitor your CBG's? Once every 2 weeks .   Financial Strains and Diabetes Management:  Are you having any financial strains with the device, your supplies or your medication? No .  Does the patient want to be seen by Chronic Care Management for management of their diabetes?  No  Would the patient like to be referred to a Nutritionist or for Diabetic Management?  No   Diabetic Exams:  Diabetic Eye Exam: Completed 03/2023 Diabetic Foot Exam: Overdue, Pt has been advised about the importance in completing this exam. Pt is scheduled for diabetic foot exam on next office visit .   Interpreter Needed?: No  Information entered by :: Renie Ora, LPN   Activities of Daily Living    04/13/2023    8:19 AM  In your present state of health, do you have any difficulty performing the following activities:  Hearing? 0  Vision? 0  Difficulty concentrating or making decisions? 0  Walking or climbing stairs? 0  Dressing or bathing? 0  Doing errands, shopping? 0  Preparing Food and eating ? N  Using the Toilet? N  In the past six months, have you accidently leaked urine? N  Do you have problems with loss of bowel control? N  Managing your Medications? N  Managing your Finances? N  Housekeeping or managing your Housekeeping? N    Patient Care Team: Dettinger, Elige Radon, MD as PCP - General (Family Medicine) Tonny Bollman, MD as PCP - Cardiology (Cardiology) Daiva Nakayama, MD (Internal Medicine) Bjorn Pippin, MD (Urology) Alfredo Martinez, MD as Consulting Physician (Urology) Bedelia Person, MD as Consulting Physician (Neurosurgery) Michaelle Copas, MD as Referring Physician (Optometry)  Indicate any recent Medical Services you may have received from other than [facility] providers in the past year (date may be approximate).     Assessment:   This is a routine wellness examination for [facility].  Hearing/Vision screen Vision Screening - Comments:: Wears  rx glasses - up to date with routine eye exams with  [facility]  Dietary issues and exercise activities discussed: Current Exercise Habits: Home exercise routine, Type of exercise: walking, Time (Minutes): 30, Frequency (Times/Week): 3, Weekly Exercise (Minutes/Week): 90, Intensity: Mild, Exercise limited by: None identified   Goals Addressed             This Visit's Progress    Prevent falls   On track    Work to complete shop safely while preventing accidents and falls.       Depression Screen    04/13/2023    8:17 AM 02/02/2023   11:04 AM 08/03/2022   10:57 AM 04/11/2022   10:12 AM 02/01/2022   10:31 AM 08/03/2021   10:37 AM 04/08/2021   10:05 AM  PHQ 2/9 Scores  PHQ - 2 Score 0 0 0 0 0 0 0  PHQ- 9 Score  1 0  1      Fall Risk    04/13/2023    8:15 AM 02/02/2023   11:04 AM 08/03/2022   10:57 AM 04/11/2022   10:10 AM 02/01/2022   10:31 AM  Fall Risk   Falls in the past year? 0 0 0 0 0  Number falls in past yr: 0   0   Injury with Fall? 0   0   Risk for fall due to : No Fall Risks   Orthopedic patient   Follow up Falls prevention discussed   Falls prevention discussed     FALL RISK PREVENTION PERTAINING TO THE HOME:  Any stairs in or around the home? No  If so, are there any without handrails? No  Home free of loose throw rugs in walkways, pet beds, electrical cords, etc? Yes  Adequate lighting in your home to reduce risk of falls? Yes   ASSISTIVE DEVICES UTILIZED TO PREVENT FALLS:  Life alert? No  Use of a cane, walker or w/c? No  Grab bars in the bathroom? Yes  Shower chair or bench in shower? Yes  Elevated toilet seat or a handicapped toilet? Yes       01/24/2018    3:20 PM  MMSE - Mini Mental State Exam  Orientation to time 5  Orientation to Place 5  Registration 3  Attention/ Calculation 5  Recall 3  Language- name 2 objects 2  Language- repeat 1  Language- follow 3 step command 3  Language- read & follow direction 1  Write a sentence 1  Copy design 1   Total score 30        04/13/2023    8:19 AM 04/11/2022   10:28 AM 04/07/2020    9:34 AM 04/07/2019   11:28 AM  6CIT Screen  What Year? 0 points 0 points 0 points 0 points  What month? 0 points 0 points 0 points 0 points  What time? 0 points 0 points 0 points 0 points  Count back from 20 0 points 0 points 0 points 0 points  Months in reverse 0 points 0 points 0 points 0 points  Repeat phrase 0 points 2 points 0 points 0 points  Total Score 0 points 2 points 0 points 0 points    Immunizations Immunization History  Administered Date(s) Administered   Influenza Split 08/02/2013   Influenza, High Dose Seasonal PF 07/26/2016, 11/08/2018   Influenza,inj,Quad PF,6+ Mos 08/19/2014   Influenza-Unspecified 08/08/2019   Moderna Sars-Covid-2 Vaccination 03/15/2020, 04/12/2020   Pneumococcal Conjugate-13 12/31/2013   Pneumococcal Polysaccharide-23 11/29/2016   Td 06/05/2018   Tdap 12/02/2006    TDAP status: Up to date  Flu Vaccine status: Declined, Education has been provided regarding the importance of this vaccine but patient still declined. Advised may receive this vaccine at local pharmacy or Health Dept. Aware to provide a copy of the vaccination record if obtained from local pharmacy or Health Dept. Verbalized acceptance and understanding.  Pneumococcal vaccine status: Up to date  Covid-19 vaccine status: Completed vaccines  Qualifies for Shingles Vaccine? Yes   Zostavax completed No   Shingrix Completed?: No.    Education has been provided regarding the importance of this vaccine. Patient has been advised to call insurance company to determine out of pocket expense if they have not yet received this vaccine. Advised may also receive vaccine at local pharmacy or Health Dept. Verbalized acceptance and understanding.  Screening Tests Health Maintenance  Topic Date Due   Zoster Vaccines- Shingrix (1 of 2) Never done   COVID-19 Vaccine (3 - Moderna risk series) 05/10/2020    OPHTHALMOLOGY EXAM  01/08/2023   Colonoscopy  04/26/2023   Hepatitis C Screening  02/02/2024 (Originally 10/20/1964)   INFLUENZA VACCINE  05/31/2023   Diabetic kidney evaluation - Urine ACR  08/04/2023   Diabetic kidney evaluation - eGFR measurement  02/02/2024   Medicare Annual Wellness (AWV)  04/12/2024   DTaP/Tdap/Td (3 - Td or Tdap) 06/05/2028   Pneumonia Vaccine 13+ Years old  Completed   HPV VACCINES  Aged Out    Health Maintenance  Health Maintenance Due  Topic Date Due   Zoster Vaccines- Shingrix (1 of 2) Never done   COVID-19 Vaccine (3 - Moderna risk series) 05/10/2020   OPHTHALMOLOGY EXAM  01/08/2023   Colonoscopy  04/26/2023    Colorectal cancer screening: Referral to GI placed 04/12/2023. Pt aware the office will call re: appt.  Lung Cancer Screening: (Low Dose CT Chest recommended if Age 34-80 years, 30 pack-year currently smoking OR have quit w/in 15years.) does not qualify.   Lung Cancer Screening Referral: n/a   Additional Screening:  Hepatitis C Screening: does not qualify;   Vision Screening: Recommended annual ophthalmology exams for early detection of glaucoma and other disorders of the eye. Is the patient up to date with their annual eye exam?  Yes  Who is the provider or what is the name of the office in which the patient attends annual eye exams? [facility] If pt is not established with a provider, would they like to be referred to a provider to establish care? No .   Dental Screening: Recommended annual dental exams for proper oral hygiene  Community Resource Referral / Chronic Care Management: CRR required this visit?  No   CCM required this visit?  No      Plan:     I have personally reviewed and noted the following in the patient's chart:   Medical and social history Use of alcohol, tobacco or illicit drugs  Current medications and supplements including opioid prescriptions. Patient is not currently taking opioid prescriptions. Functional  ability and status Nutritional status Physical activity Advanced directives List of other physicians Hospitalizations, surgeries, and ER visits in previous 12 months Vitals Screenings to include cognitive, depression, and falls Referrals and appointments  In addition, I have reviewed and discussed with patient certain preventive protocols, quality metrics, and best practice recommendations. A written personalized care plan for preventive services as well as general preventive health recommendations were provided to patient.     Lorrene Reid, LPN   [id number]/61/0960   Nurse Notes: none

## 2023-04-13 NOTE — Patient Instructions (Signed)
Bradley Wong , Thank you for taking time to come for your Medicare Wellness Visit. I appreciate your ongoing commitment to your health goals. Please review the following plan we discussed and let me know if I can assist you in the future.   These are the goals we discussed:  Goals      Chronic Disease Management Needs     Current Barriers:  Chronic Disease Management support, education, and care coordination needs related to HTN, thyroid cancer, post-surgical hypothyroidism, DM, osteoarthritis, dyslipidemia  Clinical Goal(s) related to HTN, thyroid cancer, post-surgical hypothyroidism, DM, osteoarthritis, dyslipidemia:  Over the next 60 days, patient will:  Work with the care management team to address educational, disease management, and care coordination needs  Call provider office for new or worsened signs and symptoms  Call care management team with questions or concerns Verbalize basic understanding of patient centered plan of care established today  Interventions related to HTN, thyroid cancer, post-surgical hypothyroidism, DM, osteoarthritis, dyslipidemia:  Evaluation of current treatment plans and patient's adherence to plan as established by provider Assessed patient understanding of disease states Assessed patient's education and care coordination needs Provided disease specific education to patient  Collaborated with appropriate clinical care team members regarding patient needs  Patient Self Care Activities related to HTN, thyroid cancer, post-surgical hypothyroidism, DM, osteoarthritis, dyslipidemia:  Patient is unable to independently self-manage chronic health conditions  Initial goal documentation       Prevent falls     Work to complete shop safely while preventing accidents and falls.        This is a list of the screening recommended for you and due dates:  Health Maintenance  Topic Date Due   Zoster (Shingles) Vaccine (1 of 2) Never done   COVID-19 Vaccine  (3 - Moderna risk series) 05/10/2020   Eye exam for diabetics  01/08/2023   Colon Cancer Screening  04/26/2023   Hepatitis C Screening  02/02/2024*   Flu Shot  05/31/2023   Yearly kidney health urinalysis for diabetes  08/04/2023   Yearly kidney function blood test for diabetes  02/02/2024   Medicare Annual Wellness Visit  04/12/2024   DTaP/Tdap/Td vaccine (3 - Td or Tdap) 06/05/2028   Pneumonia Vaccine  Completed   HPV Vaccine  Aged Out  *Topic was postponed. The date shown is not the original due date.    Advanced directives: Advance directive discussed with you today. I have provided a copy for you to complete at home and have notarized. Once this is complete please bring a copy in to our office so we can scan it into your chart.   Conditions/risks identified: Aim for 30 minutes of exercise or brisk walking, 6-8 glasses of water, and 5 servings of fruits and vegetables each day.   Next appointment: Follow up in one year for your annual wellness visit.   Preventive Care 33 Years and Older, Male  Preventive care refers to lifestyle choices and visits with your health care provider that can promote health and wellness. What does preventive care include? A yearly physical exam. This is also called an annual well check. Dental exams once or twice a year. Routine eye exams. Ask your health care provider how often you should have your eyes checked. Personal lifestyle choices, including: Daily care of your teeth and gums. Regular physical activity. Eating a healthy diet. Avoiding tobacco and drug use. Limiting alcohol use. Practicing safe sex. Taking low doses of aspirin every day. Taking vitamin and mineral supplements  as recommended by your health care provider. What happens during an annual well check? The services and screenings done by your health care provider during your annual well check will depend on your age, overall health, lifestyle risk factors, and family history of  disease. Counseling  Your health care provider may ask you questions about your: Alcohol use. Tobacco use. Drug use. Emotional well-being. Home and relationship well-being. Sexual activity. Eating habits. History of falls. Memory and ability to understand (cognition). Work and work Astronomer. Screening  You may have the following tests or measurements: Height, weight, and BMI. Blood pressure. Lipid and cholesterol levels. These may be checked every 5 years, or more frequently if you are over 26 years old. Skin check. Lung cancer screening. You may have this screening every year starting at age 41 if you have a 30-pack-year history of smoking and currently smoke or have quit within the past 15 years. Fecal occult blood test (FOBT) of the stool. You may have this test every year starting at age 24. Flexible sigmoidoscopy or colonoscopy. You may have a sigmoidoscopy every 5 years or a colonoscopy every 10 years starting at age 50. Prostate cancer screening. Recommendations will vary depending on your family history and other risks. Hepatitis C blood test. Hepatitis B blood test. Sexually transmitted disease (STD) testing. Diabetes screening. This is done by checking your blood sugar (glucose) after you have not eaten for a while (fasting). You may have this done every 1-3 years. Abdominal aortic aneurysm (AAA) screening. You may need this if you are a current or former smoker. Osteoporosis. You may be screened starting at age 61 if you are at high risk. Talk with your health care provider about your test results, treatment options, and if necessary, the need for more tests. Vaccines  Your health care provider may recommend certain vaccines, such as: Influenza vaccine. This is recommended every year. Tetanus, diphtheria, and acellular pertussis (Tdap, Td) vaccine. You may need a Td booster every 10 years. Zoster vaccine. You may need this after age 64. Pneumococcal 13-valent  conjugate (PCV13) vaccine. One dose is recommended after age 32. Pneumococcal polysaccharide (PPSV23) vaccine. One dose is recommended after age 24. Talk to your health care provider about which screenings and vaccines you need and how often you need them. This information is not intended to replace advice given to you by your health care provider. Make sure you discuss any questions you have with your health care provider. Document Released: 11/12/2015 Document Revised: 07/05/2016 Document Reviewed: 08/17/2015 Elsevier Interactive Patient Education  2017 [employer].  Fall Prevention in the Home Falls can cause injuries. They can happen to people of all ages. There are many things you can do to make your home safe and to help prevent falls. What can I do on the outside of my home? Regularly fix the edges of walkways and driveways and fix any cracks. Remove anything that might make you trip as you walk through a door, such as a raised step or threshold. Trim any bushes or trees on the path to your home. Use bright outdoor lighting. Clear any walking paths of anything that might make someone trip, such as rocks or tools. Regularly check to see if handrails are loose or broken. Make sure that both sides of any steps have handrails. Any raised decks and porches should have guardrails on the edges. Have any leaves, snow, or ice cleared regularly. Use sand or salt on walking paths during winter. Clean up any spills  in your garage right away. This includes oil or grease spills. What can I do in the bathroom? Use night lights. Install grab bars by the toilet and in the tub and shower. Do not use towel bars as grab bars. Use non-skid mats or decals in the tub or shower. If you need to sit down in the shower, use a plastic, non-slip stool. Keep the floor dry. Clean up any water that spills on the floor as soon as it happens. Remove soap buildup in the tub or shower regularly. Attach bath mats  securely with double-sided non-slip rug tape. Do not have throw rugs and other things on the floor that can make you trip. What can I do in the bedroom? Use night lights. Make sure that you have a light by your bed that is easy to reach. Do not use any sheets or blankets that are too big for your bed. They should not hang down onto the floor. Have a firm chair that has side arms. You can use this for support while you get dressed. Do not have throw rugs and other things on the floor that can make you trip. What can I do in the kitchen? Clean up any spills right away. Avoid walking on wet floors. Keep items that you use a lot in easy-to-reach places. If you need to reach something above you, use a strong step stool that has a grab bar. Keep electrical cords out of the way. Do not use floor polish or wax that makes floors slippery. If you must use wax, use non-skid floor wax. Do not have throw rugs and other things on the floor that can make you trip. What can I do with my stairs? Do not leave any items on the stairs. Make sure that there are handrails on both sides of the stairs and use them. Fix handrails that are broken or loose. Make sure that handrails are as long as the stairways. Check any carpeting to make sure that it is firmly attached to the stairs. Fix any carpet that is loose or worn. Avoid having throw rugs at the top or bottom of the stairs. If you do have throw rugs, attach them to the floor with carpet tape. Make sure that you have a light switch at the top of the stairs and the bottom of the stairs. If you do not have them, ask someone to add them for you. What else can I do to help prevent falls? Wear shoes that: Do not have high heels. Have rubber bottoms. Are comfortable and fit you well. Are closed at the toe. Do not wear sandals. If you use a stepladder: Make sure that it is fully opened. Do not climb a closed stepladder. Make sure that both sides of the stepladder  are locked into place. Ask someone to hold it for you, if possible. Clearly mark and make sure that you can see: Any grab bars or handrails. First and last steps. Where the edge of each step is. Use tools that help you move around (mobility aids) if they are needed. These include: Canes. Walkers. Scooters. Crutches. Turn on the lights when you go into a dark area. Replace any light bulbs as soon as they burn out. Set up your furniture so you have a clear path. Avoid moving your furniture around. If any of your floors are uneven, fix them. If there are any pets around you, be aware of where they are. Review your medicines with your doctor. Some  medicines can make you feel dizzy. This can increase your chance of falling. Ask your doctor what other things that you can do to help prevent falls. This information is not intended to replace advice given to you by your health care provider. Make sure you discuss any questions you have with your health care provider. Document Released: 08/12/2009 Document Revised: 03/23/2016 Document Reviewed: 11/20/2014 Elsevier Interactive Patient Education  2017 [employer].

## 2023-04-19 ENCOUNTER — Encounter: Payer: Self-pay | Admitting: Cardiovascular Disease

## 2023-04-19 ENCOUNTER — Ambulatory Visit: Payer: Medicare Other | Attending: Cardiovascular Disease | Admitting: Cardiovascular Disease

## 2023-04-19 VITALS — BP 114/78 | HR 64 | Ht 73.0 in | Wt 208.8 lb

## 2023-04-19 DIAGNOSIS — R1013 Epigastric pain: Secondary | ICD-10-CM | POA: Diagnosis not present

## 2023-04-19 DIAGNOSIS — R0609 Other forms of dyspnea: Secondary | ICD-10-CM | POA: Diagnosis not present

## 2023-04-19 DIAGNOSIS — E782 Mixed hyperlipidemia: Secondary | ICD-10-CM

## 2023-04-19 DIAGNOSIS — I2510 Atherosclerotic heart disease of native coronary artery without angina pectoris: Secondary | ICD-10-CM

## 2023-04-19 DIAGNOSIS — I10 Essential (primary) hypertension: Secondary | ICD-10-CM | POA: Diagnosis not present

## 2023-04-19 NOTE — Progress Notes (Signed)
Cardiology Office Note:    Date:  04/19/2023   ID:  Bradley Wong, DOB [birth year], MRN [id number]  PCP:  Dettinger, Elige Radon, MD   [facility] Providers Cardiologist:  Bradley Bollman, MD     Referring MD: Dettinger, Elige Radon, MD   Chief Complaint  Patient presents with   Shortness of Breath    History of Present Illness:    Bradley Wong is a 77 y.o. male with a hx of nonobstructive CAD and hypertension, presenting for follow-up evaluation.  The patient had a gated coronary CTA in 2019 that showed mild nonobstructive CAD.  He has had hypertension that has been well-controlled and has been compliant with treatment of mixed hyperlipidemia.  He is here alone today. 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 He then had to have a bowel movement and slowly started to feel better. He felt pain in the chest and down into the abdomen. He has felt like he has had a 'GI bug' over the course of a few days, but now feeling better.  He complains of exertional dyspnea when he is doing work.  He otherwise is doing well and denies chest pain or pressure with physical activity.  He denies orthopnea, PND, or heart palpitations.  When he had his recent event, he did not experience near syncope or frank syncope.  Past Medical History:  Diagnosis Date   Arthritis    Coronary artery disease cardiologist--- dr Excell Seltzer   per coronary CTA 01-15-2018, mild nonobstructive CAD;   nuclear stress test 08-05-2012 in epic,  showed normal perfusion w/ no ischemia, ef 69%   History of cancer chemotherapy 2000   for thyroid cancer   History of colon polyps    History of kidney stones    History of thyroid cancer 2000   status post total thyroidectomy and completed chemo same year for papillary cancer ;   followed by Dr Bradley Wong (endocrinologist), notes in epic,  no recurrence    HTN (hypertension)     followed by pcp and cardiology   Hyperlipidemia    Hypothyroidism, postsurgical 2000   endocrinologist--- Dr Bradley Wong   Mixed hyperlipidemia    followed by dr Bradley Wong   Pulmonary nodule    chest CT 07-17-2019 in epic   Right ureteral stone    Type 2 diabetes mellitus (HCC)    followed by pcp---  (06-07-2020 per pt does not check blood sugar)   Wears glasses    Wears partial dentures    upper    Past Surgical History:  Procedure Laterality Date   CYSTOSCOPY WITH STENT PLACEMENT Left 09/20/2018   Procedure: CYSTOSCOPY WITH STENT PLACEMENT;  Surgeon: Bradley Martinez, MD;  Location: [facility];  Service: Urology;  Laterality: Left;   CYSTOSCOPY/URETEROSCOPY/HOLMIUM LASER/STENT PLACEMENT Right 06/10/2020   Procedure: CYSTOSCOPY RIGHT RETROGRADE URETEROSCOPY/HOLMIUM LASER/STENT PLACEMENT;  Surgeon: Bradley Pippin, MD;  Location: [facility];  Service: Urology;  Laterality: Right;   EXTRACORPOREAL SHOCK WAVE LITHOTRIPSY Right 05/27/2020   Procedure: RIGHT EXTRACORPOREAL SHOCK WAVE LITHOTRIPSY (ESWL);  Surgeon: Heloise Purpura, MD;  Location: [facility];  Service: Urology;  Laterality: Right;   KNEE SURGERY Right 1979   PROSTATE BIOPSY  01/2013   TONSILLECTOMY  child   TOTAL THYROIDECTOMY Bilateral 2000    Current Medications: Current Meds  Medication Sig   aspirin EC 81 MG tablet  Take 81 mg by mouth daily. Swallow whole.   Cholecalciferol (VITAMIN D-3) 5000 UNITS TABS Take 5,000 Units by mouth daily.    gabapentin (NEURONTIN) 300 MG capsule Take 300 mg by mouth 3 (three) times daily.   HYDROcodone-acetaminophen (NORCO) 10-325 MG tablet Take 0.5 tablets by mouth daily as needed for severe pain. Take 1/2 tablets prn   levothyroxine (SYNTHROID) 125 MCG tablet Take 1 tablet (125 mcg total) by mouth daily before breakfast.   olmesartan (BENICAR) 5 MG tablet TAKE ONE TABLET ONCE DAILY   rosuvastatin (CRESTOR) 40 MG tablet TAKE 1/2 TABLET DAILY   tadalafil (CIALIS) 5  MG tablet Take 5 mg by mouth daily.   tamsulosin (FLOMAX) 0.4 MG CAPS capsule TAKE (1) CAPSULE DAILY (Patient taking differently: Take 0.4 mg by mouth daily. TAKE (2) CAPSULE DAILY)     Allergies:   Niaspan [niacin er] and Penicillins   Social History   Socioeconomic History   Marital status: Married    Spouse name: Dondra Spry   Number of children: 4   Years of education: 14   Highest education level: Associate degree: occupational, Scientist, product/process development, or vocational program  Occupational History   Occupation: retired    Associate Professor: Nurse, adult [place]    Comment: Lobbyist work  Tobacco Use   Smoking status: Never   Smokeless tobacco: Never  Vaping Use   Vaping Use: Never used  Substance and Sexual Activity   Alcohol use: Not Currently   Drug use: Never   Sexual activity: Not on file  Other Topics Concern   Not on file  Social History Narrative   Not on file   Social Determinants of Health   Financial Resource Strain: Low Risk  (04/13/2023)   Overall Financial Resource Strain (CARDIA)    Difficulty of Paying Living Expenses: Not hard at all  Food Insecurity: No Food Insecurity (04/13/2023)   Hunger Vital Sign    Worried About Running Out of Food in the Last Year: Never true    Ran Out of Food in the Last Year: Never true  Transportation Needs: No Transportation Needs (04/13/2023)   PRAPARE - Administrator, Civil Service (Medical): No    Lack of Transportation (Non-Medical): No  Physical Activity: Insufficiently Active (04/13/2023)   Exercise Vital Sign    Days of Exercise per Week: 3 days    Minutes of Exercise per Session: 30 min  Stress: No Stress Concern Present (04/13/2023)   [employer] of Occupational Health - Occupational Stress Questionnaire    Feeling of Stress : Not at all  Social Connections: Moderately Integrated (04/13/2023)   Social Connection and Isolation Panel [NHANES]    Frequency of Communication with Friends and Family: More than three times a week     Frequency of Social Gatherings with Friends and Family: More than three times a week    Attends Religious Services: More than 4 times per year    Active Member of [employer] or Organizations: No    Attends Engineer, structural: Never    Marital Status: Married     Family History: The patient's family history includes Cancer in his paternal grandfather; Cancer (age of onset: 54) in his father; Congestive Heart Failure in his mother; Coronary artery disease (age of onset: 64) in his mother; Diabetes in his brother, brother, and maternal grandmother; Heart disease in his brother and brother; Hypertension in his mother; Kidney failure in his brother and brother. There is no history of Colon cancer, Rectal cancer,  or Stomach cancer.  ROS:   Please see the history of present illness.    All other systems reviewed and are negative.  EKGs/Labs/Other Studies Reviewed:    The following studies were reviewed today: Cardiac Studies & Procedures       ECHOCARDIOGRAM  ECHOCARDIOGRAM COMPLETE 07/23/2020  Narrative ECHOCARDIOGRAM REPORT    Patient Name:   TREVIUS KEMPER [facility] Date of Exam: 07/23/2020 Medical Rec #:  [id number]        Height:       74.0 in Accession #:    [id number]       Weight:       207.2 lb Date of Birth:  1946-03-06       BSA:          2.206 m Patient Age:    73 years         BP:           126/78 mmHg Patient Gender: M                HR:           66 bpm. Exam Location:  [address]  Procedure: 2D Echo, Cardiac Doppler and Color Doppler  Indications:    R06.00 Dyspnea  History:        Patient has no prior history of Echocardiogram examinations. Risk Factors:Hypertension and Dyslipidemia. Pre-diabetes. Hypothyroidism.  Sonographer:    Cathie Beams RCS Referring Phys: [phone] Tymia Streb  IMPRESSIONS   1. Left ventricular ejection fraction, by estimation, is 55%. The left ventricle has normal function. The left ventricle has no regional wall motion  abnormalities. There is mild left ventricular hypertrophy. Left ventricular diastolic parameters are consistent with Grade I diastolic dysfunction (impaired relaxation). 2. Right ventricular systolic function is normal. The right ventricular size is normal. There is normal pulmonary artery systolic pressure. The estimated right ventricular systolic pressure is 20.8 mmHg. 3. The mitral valve is normal in structure. Trivial mitral valve regurgitation. No evidence of mitral stenosis. 4. The aortic valve is tricuspid. Aortic valve regurgitation is not visualized. No aortic stenosis is present. 5. Aortic dilatation noted. There is mild dilatation of the aortic root, measuring 38 mm. 6. The inferior vena cava is normal in size with greater than 50% respiratory variability, suggesting right atrial pressure of 3 mmHg.  FINDINGS Left Ventricle: Left ventricular ejection fraction, by estimation, is 55%. The left ventricle has normal function. The left ventricle has no regional wall motion abnormalities. The left ventricular internal cavity size was normal in size. There is mild left ventricular hypertrophy. Left ventricular diastolic parameters are consistent with Grade I diastolic dysfunction (impaired relaxation).  Right Ventricle: The right ventricular size is normal. No increase in right ventricular wall thickness. Right ventricular systolic function is normal. There is normal pulmonary artery systolic pressure. The tricuspid regurgitant velocity is 2.11 m/s, and with an assumed right atrial pressure of 3 mmHg, the estimated right ventricular systolic pressure is 20.8 mmHg.  Left Atrium: Left atrial size was normal in size.  Right Atrium: Right atrial size was normal in size.  Pericardium: There is no evidence of pericardial effusion.  Mitral Valve: The mitral valve is normal in structure. Trivial mitral valve regurgitation. No evidence of mitral valve stenosis.  Tricuspid Valve: The tricuspid valve  is normal in structure. Tricuspid valve regurgitation is trivial.  Aortic Valve: The aortic valve is tricuspid. Aortic valve regurgitation is not visualized. No aortic stenosis is present.  Pulmonic Valve: The pulmonic valve  was normal in structure. Pulmonic valve regurgitation is trivial.  Aorta: Aortic dilatation noted. There is mild dilatation of the aortic root, measuring 38 mm.  Venous: The inferior vena cava is normal in size with greater than 50% respiratory variability, suggesting right atrial pressure of 3 mmHg.  IAS/Shunts: No atrial level shunt detected by color flow Doppler.   LEFT VENTRICLE PLAX 2D LVIDd:         4.10 cm  Diastology LVIDs:         2.80 cm  LV e' medial:    3.97 cm/s LV PW:         1.40 cm  LV E/e' medial:  12.9 LV IVS:        1.50 cm  LV e' lateral:   8.11 cm/s LVOT diam:     1.80 cm  LV E/e' lateral: 6.3 LV SV:         50 LV SV Index:   23 LVOT Area:     2.54 cm   RIGHT VENTRICLE RV Basal diam:  2.70 cm RV S prime:     7.22 cm/s TAPSE (M-mode): 1.6 cm RVSP:           25.8 mmHg  LEFT ATRIUM             Index       RIGHT ATRIUM           Index LA diam:        3.50 cm 1.59 cm/m  RA Pressure: 8.00 mmHg LA Vol (A2C):   46.7 ml 21.17 ml/m RA Area:     14.20 cm LA Vol (A4C):   45.3 ml 20.53 ml/m RA Volume:   32.80 ml  14.87 ml/m LA Biplane Vol: 47.1 ml 21.35 ml/m AORTIC VALVE LVOT Vmax:   80.90 cm/s LVOT Vmean:  49.800 cm/s LVOT VTI:    0.197 m  AORTA Ao Root diam: 3.55 cm  MITRAL VALVE               TRICUSPID VALVE MV Area (PHT): 4.08 cm    TR Peak grad:   17.8 mmHg MV Decel Time: 186 msec    TR Vmax:        211.00 cm/s MV E velocity: 51.40 cm/s  Estimated RAP:  8.00 mmHg MV A velocity: 87.40 cm/s  RVSP:           25.8 mmHg MV E/A ratio:  0.59 SHUNTS Systemic VTI:  0.20 m Systemic Diam: 1.80 cm  Marca Ancona MD Electronically signed by Marca Ancona MD Signature Date/Time: 07/23/2020/4:08:02 PM    Final     CT SCANS  CT  CORONARY MORPH W/CTA COR W/SCORE 01/17/2018  Addendum 01/17/2018  7:36 PM ADDENDUM REPORT: 01/17/2018 19:34  CLINICAL DATA:  77 year old male with hyperlipidemia, hypertension, diabetes and DOE.  EXAM: Cardiac/Coronary  CT  TECHNIQUE: The patient was scanned on a [employer].  FINDINGS: A 120 kV prospective scan was triggered in the descending thoracic aorta at 111 HU's. Axial non-contrast 3 mm slices were carried out through the heart. The data set was analyzed on a dedicated work station and scored using the Agatson method. Gantry rotation speed was 250 msecs and collimation was .6 mm. No beta blockade and 0.8 mg of sl NTG was given. The 3D data set was reconstructed in 5% intervals of the 67-82 % of the R-R cycle. Diastolic phases were analyzed on a dedicated work station using MPR, MIP and VRT modes. The patient  received 80 cc of contrast.  Aorta: Normal size. Trivial calcifications and atherosclerosis. No dissection.  Aortic Valve:  Trileaflet.  No calcifications.  Coronary Arteries:  Normal coronary origin.  Right dominance.  RCA is a medium size dominant artery that gives rise to PDA and PLVB. There is minimal plaque.  Left main is a short artery that bifurcates into LAD and LCX arteries. There is minimal ostial eccentric calcified plaque with associated stenosis 0-25%.  LAD is a large vessel that gives rise to one diagonal artery. There is mild calcified plaque in the proximal segment with associated stenosis 25-50%. Mid, distal LAD and diagonal artery have no significant stenosis.  LCX is a non-dominant artery that gives rise to one large OM1 branch. There is no plaque.  Other findings:  Normal pulmonary vein drainage into the left atrium.  Normal let atrial appendage without a thrombus.  Normal size of the pulmonary artery.  IMPRESSION: 1. Coronary calcium score of 35. This was 31 percentile for age and sex matched control.  2. Normal  coronary origin with right dominance.  3. Mild non-obstructive CAD in the left main and proximal LAD.   Electronically Signed By: Tobias Alexander On: 01/17/2018 19:34  Narrative EXAM: OVER-READ INTERPRETATION  CT CHEST  The following report is an over-read performed by radiologist Dr. Trudie Reed of [facility] on 01/15/2018. This over-read does not include interpretation of cardiac or coronary anatomy or pathology. The coronary calcium score/coronary CTA interpretation by the cardiologist is attached.  COMPARISON:  None.  FINDINGS: Aortic atherosclerosis. 3 mm subpleural nodule in the periphery of the right middle lobe (axial image 30 of series 12). Scattered areas of subsegmental atelectasis or mild scarring in the dependent portions of the lower lobes of the lungs bilaterally. Within the visualized portions of the thorax there are no suspicious appearing pulmonary nodules or masses, there is no acute consolidative airspace disease, no pleural effusions, no pneumothorax and no lymphadenopathy. Visualized portions of the upper abdomen are unremarkable. There are no aggressive appearing lytic or blastic lesions noted in the visualized portions of the skeleton.  IMPRESSION: 1.  Aortic Atherosclerosis (ICD10-I70.0). 2. 3 mm subpleural nodule in the periphery of the right middle lobe, nonspecific but statistically likely benign. No follow-up needed if patient is low-risk. Non-contrast chest CT can be considered in 12 months if patient is high-risk. This recommendation follows the consensus statement: Guidelines for Management of Incidental Pulmonary Nodules Detected on CT Images: From the Fleischner Society 2017; Radiology 2017; 284:228-243.  Electronically Signed: By: Trudie Reed M.D. On: 01/15/2018 13:18               Recent Labs: 02/02/2023: ALT 21; BUN 14; Creatinine, Ser 0.99; Hemoglobin 14.7; Platelets 149; Potassium 4.9; Sodium 141; TSH  1.050  Recent Lipid Panel    Component Value Date/Time   CHOL 90 (L) 02/02/2023 1112   CHOL 94 04/30/2013 0810   TRIG 60 02/02/2023 1112   TRIG 62 04/18/2017 1050   TRIG 53 04/30/2013 0810   HDL 43 02/02/2023 1112   HDL 44 04/18/2017 1050   HDL 36 (L) 04/30/2013 0810   CHOLHDL 2.1 02/02/2023 1112   LDLCALC 33 02/02/2023 1112   LDLCALC 39 05/12/2014 0841   LDLCALC 47 04/30/2013 0810     Risk Assessment/Calculations:                Physical Exam:    VS:  BP 114/78   Pulse 64   Ht 6\' 1"  (  1.854 m)   Wt 208 lb 12.8 oz (94.7 kg)   SpO2 98%   BMI 27.55 kg/m     Wt Readings from Last 3 Encounters:  04/19/23 208 lb 12.8 oz (94.7 kg)  04/13/23 207 lb (93.9 kg)  02/02/23 208 lb (94.3 kg)     GEN:  Well nourished, well developed in no acute distress HEENT: Normal NECK: No JVD; No carotid bruits LYMPHATICS: No lymphadenopathy CARDIAC: RRR, no murmurs, rubs, gallops RESPIRATORY:  Clear to auscultation without rales, wheezing or rhonchi  ABDOMEN: Soft, non-tender, non-distended MUSCULOSKELETAL: 1+ bilateral ankle edema; No deformity.  Posterior tibial pulses are 2+ and equal bilaterally. SKIN: Warm and dry NEUROLOGIC:  Alert and oriented x 3 PSYCHIATRIC:  Normal affect   ASSESSMENT:    1. Coronary artery disease involving native coronary artery of native heart without angina pectoris   2. Mixed hyperlipidemia   3. Essential hypertension   4. Exertional dyspnea   5. Epigastric pain    PLAN:    In order of problems listed above:  Appears stable with respect to his coronary artery disease.  No anginal symptoms.  Continue rosuvastatin and aspirin.  Follow-up 1 year. Treated with rosuvastatin 20 mg daily.  Cholesterol is 90, LDL 33. Blood pressure is well-controlled on olmesartan.  Renal function is normal with a creatinine of 0.99 and potassium of 4.9. His cardiac exam is essentially normal with the exception of mild ankle edema.  Will check a 2D echocardiogram to  evaluate for LV systolic or diastolic dysfunction or other cardiac etiologies of exertional dyspnea. Recommend abdominal aortic ultrasound to evaluate for aneurysm.     Medication Adjustments/Labs and Tests Ordered: Current medicines are reviewed at length with the patient today.  Concerns regarding medicines are outlined above.  Orders Placed This Encounter  Procedures   ECHOCARDIOGRAM COMPLETE   VAS [place] AAA DUPLEX   No orders of the defined types were placed in this encounter.   Patient Instructions  Medication Instructions:  Your physician recommends that you continue on your current medications as directed. Please refer to the Current Medication list given to you today.  *If you need a refill on your cardiac medications before your next appointment, please call your pharmacy*   Testing/Procedures: Abdominal US AAA Your physician has requested that you have an abdominal aorta duplex. During this test, an ultrasound is used to evaluate the aorta. Allow 30 minutes for this exam. Do not eat after midnight the day before and avoid carbonated beverages   ECHO Your physician has requested that you have an echocardiogram. Echocardiography is a painless test that uses sound waves to create images of your heart. It provides your doctor with information about the size and shape of your heart and how well your heart's chambers and valves are working. This procedure takes approximately one hour. There are no restrictions for this procedure. Please do NOT wear cologne, perfume, aftershave, or lotions (deodorant is allowed). Please arrive 15 minutes prior to your appointment time.    Follow-Up: At [facility], you and your health needs are our priority.  As part of our continuing mission to provide you with exceptional heart care, we have created designated Provider Care Teams.  These Care Teams include your primary Cardiologist (physician) and Advanced Practice Providers (APPs -   Physician Assistants and Nurse Practitioners) who all work together to provide you with the care you need, when you need it.    Your next appointment:   1 year(s)  Provider:   Tonny Bollman, MD        Signed, Bradley Bollman, MD  04/19/2023 1:39 PM    [facility]

## 2023-04-19 NOTE — Patient Instructions (Signed)
Medication Instructions:  Your physician recommends that you continue on your current medications as directed. Please refer to the Current Medication list given to you today.  *If you need a refill on your cardiac medications before your next appointment, please call your pharmacy*   Testing/Procedures: Abdominal US AAA Your physician has requested that you have an abdominal aorta duplex. During this test, an ultrasound is used to evaluate the aorta. Allow 30 minutes for this exam. Do not eat after midnight the day before and avoid carbonated beverages   ECHO Your physician has requested that you have an echocardiogram. Echocardiography is a painless test that uses sound waves to create images of your heart. It provides your doctor with information about the size and shape of your heart and how well your heart's chambers and valves are working. This procedure takes approximately one hour. There are no restrictions for this procedure. Please do NOT wear cologne, perfume, aftershave, or lotions (deodorant is allowed). Please arrive 15 minutes prior to your appointment time.    Follow-Up: At [facility], you and your health needs are our priority.  As part of our continuing mission to provide you with exceptional heart care, we have created designated Provider Care Teams.  These Care Teams include your primary Cardiologist (physician) and Advanced Practice Providers (APPs -  Physician Assistants and Nurse Practitioners) who all work together to provide you with the care you need, when you need it.    Your next appointment:   1 year(s)  Provider:   Tonny Bollman, MD

## 2023-05-17 ENCOUNTER — Ambulatory Visit (HOSPITAL_BASED_OUTPATIENT_CLINIC_OR_DEPARTMENT_OTHER): Payer: Medicare Other

## 2023-05-17 ENCOUNTER — Ambulatory Visit (HOSPITAL_COMMUNITY)
Admission: RE | Admit: 2023-05-17 | Discharge: 2023-05-17 | Disposition: A | Discharge status: Home / Self Care | Payer: Medicare Other | Source: Ambulatory Visit | Attending: Cardiovascular Disease | Admitting: Cardiovascular Disease

## 2023-05-17 DIAGNOSIS — E782 Mixed hyperlipidemia: Secondary | ICD-10-CM | POA: Diagnosis not present

## 2023-05-17 DIAGNOSIS — I2510 Atherosclerotic heart disease of native coronary artery without angina pectoris: Secondary | ICD-10-CM

## 2023-05-17 DIAGNOSIS — I10 Essential (primary) hypertension: Secondary | ICD-10-CM | POA: Diagnosis not present

## 2023-05-17 DIAGNOSIS — Z8489 Family history of other specified conditions: Secondary | ICD-10-CM | POA: Insufficient documentation

## 2023-05-17 DIAGNOSIS — Z136 Encounter for screening for cardiovascular disorders: Secondary | ICD-10-CM | POA: Insufficient documentation

## 2023-05-17 LAB — ECHOCARDIOGRAM COMPLETE
Area-P 1/2: 3.75 cm2
P 1/2 time: 249 msec
S' Lateral: 3.05 cm

## 2023-05-18 DIAGNOSIS — M5416 Radiculopathy, lumbar region: Secondary | ICD-10-CM | POA: Diagnosis not present

## 2023-05-30 ENCOUNTER — Ambulatory Visit: Payer: Medicare Other | Attending: Neurosurgery

## 2023-05-30 ENCOUNTER — Other Ambulatory Visit: Payer: Self-pay

## 2023-05-30 DIAGNOSIS — M5459 Other low back pain: Secondary | ICD-10-CM | POA: Diagnosis not present

## 2023-05-30 NOTE — Therapy (Signed)
OUTPATIENT PHYSICAL THERAPY THORACOLUMBAR EVALUATION   Patient Name: Bradley Wong MRN: [id number] DOB:[birth year], 77 y.o., male Today's Date: 05/30/2023  END OF SESSION:  PT End of Session - 05/30/23 1103     Visit Number 1    Number of Visits 1    Date for PT Re-Evaluation 05/31/23    PT Start Time 1104    PT Stop Time 1136    PT Time Calculation (min) 32 min    Activity Tolerance Patient tolerated treatment well    Behavior During Therapy [facility] for tasks assessed/performed             Past Medical History:  Diagnosis Date   Arthritis    Coronary artery disease cardiologist--- dr Excell Seltzer   per coronary CTA 01-15-2018, mild nonobstructive CAD;   nuclear stress test 08-05-2012 in epic,  showed normal perfusion w/ no ischemia, ef 69%   History of cancer chemotherapy 2000   for thyroid cancer   History of colon polyps    History of kidney stones    History of thyroid cancer 2000   status post total thyroidectomy and completed chemo same year for papillary cancer ;   followed by Dr Kirtland Bouchard. Hairston (endocrinologist), notes in epic,  no recurrence    HTN (hypertension)    followed by pcp and cardiology   Hyperlipidemia    Hypothyroidism, postsurgical 2000   endocrinologist--- Dr Kirtland Bouchard. Hairston   Mixed hyperlipidemia    followed by dr cooper   Pulmonary nodule    chest CT 07-17-2019 in epic   Right ureteral stone    Type 2 diabetes mellitus (HCC)    followed by pcp---  (06-07-2020 per pt does not check blood sugar)   Wears glasses    Wears partial dentures    upper   Past Surgical History:  Procedure Laterality Date   CYSTOSCOPY WITH STENT PLACEMENT Left 09/20/2018   Procedure: CYSTOSCOPY WITH STENT PLACEMENT;  Surgeon: Alfredo Martinez, MD;  Location: [facility];  Service: Urology;  Laterality: Left;   CYSTOSCOPY/URETEROSCOPY/HOLMIUM LASER/STENT PLACEMENT Right 06/10/2020   Procedure: CYSTOSCOPY RIGHT RETROGRADE URETEROSCOPY/HOLMIUM LASER/STENT PLACEMENT;  Surgeon: Bjorn Pippin, MD;  [facility];  Service: Urology;  Laterality: Right;   EXTRACORPOREAL SHOCK WAVE LITHOTRIPSY Right 05/27/2020   Procedure: RIGHT EXTRACORPOREAL SHOCK WAVE LITHOTRIPSY (ESWL);  Surgeon: Heloise Purpura, MD;  Location[facility];  Service: Urology;  Laterality: Right;   KNEE SURGERY Right 1979   PROSTATE BIOPSY  01/2013   TONSILLECTOMY  child   TOTAL THYROIDECTOMY Bilateral 2000   Patient Active Problem List   Diagnosis Date Noted   Lumbar back pain with radiculopathy affecting right lower extremity 08/16/2020   DDD (degenerative disc disease), lumbar 08/05/2020   Lumbar stenosis 06/04/2020   Pre-diabetes 06/25/2015   Thrombocytopenia (HCC) 02/04/2015   History of thyroid cancer 09/04/2014   Hyperlipemia 09/04/2014   Lumbar spondylosis 05/12/2014   Vitamin D deficiency 05/12/2014   Hypothyroidism, postsurgical 03/30/2014   Metabolic syndrome 01/22/2013   Elevated PSA 01/22/2013   Essential hypertension, benign 01/06/2013    PCP: Dettinger, Elige Radon, MD  REFERRING PROVIDER: Bedelia Person, MD   REFERRING DIAG: Radiculopathy, lumbar region   Rationale for Evaluation and Treatment: Rehabilitation  THERAPY DIAG:  Other low back pain  ONSET DATE: 1970  SUBJECTIVE:  SUBJECTIVE STATEMENT: Patient reports that his back has been bothering him since 1970 and it has been getting worse. He has had surgery on his back previously. He has also had an injection which helped some, but it started hurting again after a while. He was told that he had to come to therapy at least once for his insurance "so that's the only reason I am here." He was told that therapy will not help any.   PERTINENT HISTORY:  Hypertension, history of cancer, arthritis, and type 2  diabetes  PAIN:  Are you having pain? Yes: NPRS scale: 5-6+/10 Pain location: low back radiating down back of both legs Pain description: aching Aggravating factors: working, steps, transfers Relieving factors: medication and rest  PRECAUTIONS: None  RED FLAGS: None   WEIGHT BEARING RESTRICTIONS: No  FALLS:  Has patient fallen in last 6 months? No  LIVING ENVIRONMENT: Lives with: lives with their spouse Lives in: House/apartment Stairs: Yes: External: 3 steps; avoids these steps Has following equipment at home: None  OCCUPATION: retired  PLOF: Independent  PATIENT GOALS: to have 1 visit for insurance  NEXT MD VISIT: July 06, 2023  OBJECTIVE:   SCREENING FOR RED FLAGS: Bowel or bladder incontinence: No Spinal tumors: No Cauda equina syndrome: No Compression fracture: No Abdominal aneurysm: No  COGNITION: Overall cognitive status: Within functional limits for tasks assessed     SENSATION: Patient reports rare numbness in his right leg.   POSTURE: decreased lumbar lordosis  LUMBAR ROM:   AROM eval  Flexion 40  Extension 16; familiar low back and right hip pain  Right lateral flexion 75% limited; "pulling" in left low back  Left lateral flexion 75% limited; familiar pain  Right rotation 50% limited; "pulling" in low back   Left rotation 50% limited; familiar low back and RLE pain    (Blank rows = not tested)  LOWER EXTREMITY ROM: WFL for activities assessed  LOWER EXTREMITY MMT:    MMT Right eval Left eval  Hip flexion 4/5 4/5  Hip extension    Hip abduction    Hip adduction    Hip internal rotation    Hip external rotation    Knee flexion 5/5 4+/5  Knee extension 5/5 4+/5  Ankle dorsiflexion 4-/5 4-/5  Ankle plantarflexion    Ankle inversion    Ankle eversion     (Blank rows = not tested)  LUMBAR SPECIAL TESTS:  Straight leg raise test: Positive and FABER test: Positive  GAIT: Assistive device utilized: None Level of assistance:  Complete Independence Comments: decreased gait speed  TODAY'S TREATMENT:                                                                                                                              DATE:     PATIENT EDUCATION:  Education details: anatomy, arthritis, prognosis, and healing Person educated: Patient Education method: Explanation Education comprehension: verbalized understanding  HOME EXERCISE PROGRAM: Patient declined  HEP  ASSESSMENT:  CLINICAL IMPRESSION: Patient is a 77 y.o. male who was seen today for physical therapy evaluation and treatment for chronic low back and lower extremity pain. He presented with moderate pain severity and irritability with lumbar active range of motion being the most aggravating to her familiar pain. His familiar symptoms were also reproduced with lumbar special tests. He declined a home exercise program and felt comfortable being discharged at this time.   PHYSICAL THERAPY DISCHARGE SUMMARY  Visits from Start of Care: 1  Current functional level related to goals / functional outcomes: Patient requested an evaluation only and to be discharged at this time.   Remaining deficits: See above   Education / Equipment: Patient was educated on lumbar anatomy, arthritis, prognosis, and healing.  Patient agrees to discharge. Patient goals were not met. Patient is being discharged due to the patient's request.    OBJECTIVE IMPAIRMENTS: decreased activity tolerance, decreased mobility, decreased ROM, decreased strength, and pain.   ACTIVITY LIMITATIONS: lifting, bending, standing, stairs, transfers, and locomotion level  PARTICIPATION LIMITATIONS: shopping, community activity, and yard work  PERSONAL FACTORS: Behavior pattern, Past/current experiences, Time since onset of injury/illness/exacerbation, and 3+ comorbidities: Hypertension, history of cancer, arthritis, and type 2 diabetes  are also affecting patient's functional outcome.    REHAB POTENTIAL: Poor    CLINICAL DECISION MAKING: Evolving/moderate complexity  EVALUATION COMPLEXITY: Moderate   GOALS: Goals reviewed with patient? No  EVALUATION ONLY  PLAN:  PT FREQUENCY: one time visit  PT DURATION: 1 week  PLANNED INTERVENTIONS: Therapeutic exercises, Therapeutic activity, Neuromuscular re-education, Balance training, Gait training, Patient/Family education, Self Care, Joint mobilization, Stair training, Electrical stimulation, Spinal mobilization, Cryotherapy, Moist heat, Traction, Manual therapy, and Re-evaluation.  PLAN FOR NEXT SESSION: Evaluation only    Granville Lewis, PT 05/30/2023, 1:20 PM

## 2023-06-08 DIAGNOSIS — C73 Malignant neoplasm of thyroid gland: Secondary | ICD-10-CM | POA: Diagnosis not present

## 2023-07-06 DIAGNOSIS — M5416 Radiculopathy, lumbar region: Secondary | ICD-10-CM | POA: Diagnosis not present

## 2023-07-18 ENCOUNTER — Other Ambulatory Visit: Payer: Self-pay | Admitting: Family Medicine

## 2023-07-18 DIAGNOSIS — E118 Type 2 diabetes mellitus with unspecified complications: Secondary | ICD-10-CM

## 2023-07-18 DIAGNOSIS — I10 Essential (primary) hypertension: Secondary | ICD-10-CM

## 2023-07-27 DIAGNOSIS — M5416 Radiculopathy, lumbar region: Secondary | ICD-10-CM | POA: Diagnosis not present

## 2023-07-27 DIAGNOSIS — Z6828 Body mass index (BMI) 28.0-28.9, adult: Secondary | ICD-10-CM | POA: Diagnosis not present

## 2023-08-10 ENCOUNTER — Encounter: Payer: Self-pay | Admitting: Family Medicine

## 2023-08-10 ENCOUNTER — Ambulatory Visit (INDEPENDENT_AMBULATORY_CARE_PROVIDER_SITE_OTHER): Payer: Medicare Other | Admitting: Family Medicine

## 2023-08-10 VITALS — BP 108/68 | HR 72 | Ht 72.0 in | Wt 211.0 lb

## 2023-08-10 DIAGNOSIS — R7303 Prediabetes: Secondary | ICD-10-CM | POA: Diagnosis not present

## 2023-08-10 DIAGNOSIS — E7800 Pure hypercholesterolemia, unspecified: Secondary | ICD-10-CM

## 2023-08-10 DIAGNOSIS — I10 Essential (primary) hypertension: Secondary | ICD-10-CM

## 2023-08-10 DIAGNOSIS — E890 Postprocedural hypothyroidism: Secondary | ICD-10-CM | POA: Diagnosis not present

## 2023-08-10 LAB — BAYER DCA HB A1C WAIVED: HB A1C (BAYER DCA - WAIVED): 6.4 % — ABNORMAL HIGH (ref 4.8–5.6)

## 2023-08-10 NOTE — Patient Instructions (Signed)
Referral sent to: [facility] [address], [place] [address] [phone]

## 2023-08-10 NOTE — Progress Notes (Signed)
BP 108/68   Pulse 72   Ht 6' (1.829 m)   Wt 211 lb (95.7 kg)   SpO2 97%   BMI 28.62 kg/m    Subjective:   Patient ID: Bradley Wong, male    DOB: [birth year], 77 y.o.   MRN: [id number]  HPI: Bradley Wong is a 77 y.o. male presenting on 08/10/2023 for Medical Management of Chronic Issues, Hypothyroidism, and Hypertension   HPI Hypertension Patient is currently on olmesartan, and their blood pressure today is 108/68. Patient denies any lightheadedness or dizziness. Patient denies headaches, blurred vision, chest pains, shortness of breath, or weakness. Denies any side effects from medication and is content with current medication.   Hypothyroidism recheck Patient is coming in for thyroid recheck today as well. They deny any issues with hair changes or heat or cold problems or diarrhea or constipation. They deny any chest pain or palpitations. They are currently on levothyroxine 125 micrograms   Hyperlipidemia Patient is coming in for recheck of his hyperlipidemia. The patient is currently taking Crestor. They deny any issues with myalgias or history of liver damage from it. They deny any focal numbness or weakness or chest pain.   Prediabetes Patient comes in today for recheck of his diabetes. Patient has been currently taking no medicine currently, diet control. Patient is currently on an ACE inhibitor/ARB. Patient has not seen an ophthalmologist this year. Patient denies any new issues with their feet. The symptom started onset as an adult hypothyroidism and hypertension and hyperlipidemia ARE RELATED TO DM   Relevant past medical, surgical, family and social history reviewed and updated as indicated. Interim medical history since our last visit reviewed. Allergies and medications reviewed and updated.  Review of Systems  Constitutional:  Negative for chills and fever.  Eyes:  Negative for visual disturbance.  Respiratory:  Negative for shortness of breath and wheezing.    Cardiovascular:  Negative for chest pain and leg swelling.  Musculoskeletal:  Negative for back pain and gait problem.  Skin:  Negative for rash.  Neurological:  Negative for dizziness and light-headedness.  All other systems reviewed and are negative.   Per HPI unless specifically indicated above   Allergies as of 08/10/2023       Reactions   Niaspan [niacin Er] Other (See Comments)   flushing   Penicillins Rash   Has patient had a PCN reaction causing immediate rash, facial/tongue/throat swelling, SOB or lightheadedness with hypotension: No Has patient had a PCN reaction causing severe rash involving mucus membranes or skin necrosis: No Has patient had a PCN reaction that required hospitalization: Unknown Has patient had a PCN reaction occurring within the last 10 years: No If all of the above answers are "NO", then may proceed with Cephalosporin use.        Medication List        Accurate as of August 10, 2023 11:43 AM. If you have any questions, ask your nurse or doctor.          aspirin EC 81 MG tablet Take 81 mg by mouth daily. Swallow whole.   gabapentin 300 MG capsule Commonly known as: NEURONTIN Take 300 mg by mouth 3 (three) times daily.   HYDROcodone-acetaminophen 10-325 MG tablet Commonly known as: NORCO Take 0.5 tablets by mouth daily as needed for severe pain. Take 1/2 tablets prn   levothyroxine 125 MCG tablet Commonly known as: SYNTHROID Take 1 tablet (125 mcg total) by mouth daily before breakfast.   olmesartan  5 MG tablet Commonly known as: BENICAR TAKE ONE TABLET ONCE DAILY   rosuvastatin 40 MG tablet Commonly known as: CRESTOR TAKE 1/2 TABLET DAILY   tadalafil 5 MG tablet Commonly known as: CIALIS Take 5 mg by mouth daily.   tamsulosin 0.4 MG Caps capsule Commonly known as: FLOMAX TAKE (1) CAPSULE DAILY What changed:  how much to take how to take this when to take this additional instructions   Vitamin D-3 125 MCG (5000 UT)  Tabs Take 5,000 Units by mouth daily.         Objective:   BP 108/68   Pulse 72   Ht 6' (1.829 m)   Wt 211 lb (95.7 kg)   SpO2 97%   BMI 28.62 kg/m   Wt Readings from Last 3 Encounters:  08/10/23 211 lb (95.7 kg)  04/19/23 208 lb 12.8 oz (94.7 kg)  04/13/23 207 lb (93.9 kg)    Physical Exam Vitals and nursing note reviewed.  Constitutional:      General: He is not in acute distress.    Appearance: He is well-developed. He is not diaphoretic.  Eyes:     General: No scleral icterus.    Conjunctiva/sclera: Conjunctivae normal.  Neck:     Thyroid: No thyromegaly.  Cardiovascular:     Rate and Rhythm: Normal rate and regular rhythm.     Heart sounds: Normal heart sounds. No murmur heard. Pulmonary:     Effort: Pulmonary effort is normal. No respiratory distress.     Breath sounds: Normal breath sounds. No wheezing.  Musculoskeletal:        General: No swelling. Normal range of motion.     Cervical back: Neck supple.  Lymphadenopathy:     Cervical: No cervical adenopathy.  Skin:    General: Skin is warm and dry.     Findings: No rash.  Neurological:     Mental Status: He is alert and oriented to person, place, and time.     Coordination: Coordination normal.  Psychiatric:        Behavior: Behavior normal.       Assessment & Plan:   Problem List Items Addressed This Visit       Cardiovascular and Mediastinum   Essential hypertension, benign (Chronic)   Relevant Orders   Bayer DCA Hb A1c Waived   CBC with Differential/Platelet   CMP14+EGFR   Lipid panel   TSH     Endocrine   Hypothyroidism, postsurgical   Relevant Orders   Bayer DCA Hb A1c Waived   CBC with Differential/Platelet   CMP14+EGFR   Lipid panel   TSH     Other   Hyperlipemia   Relevant Orders   Bayer DCA Hb A1c Waived   CBC with Differential/Platelet   CMP14+EGFR   Lipid panel   TSH   Pre-diabetes - Primary   Relevant Orders   Bayer DCA Hb A1c Waived   CBC with  Differential/Platelet   CMP14+EGFR   Lipid panel   TSH    Patient sees urology for his prostate and also has a thyroid doctor that checks that. Follow up plan: Return in about 6 months (around 02/08/2024), or if symptoms worsen or fail to improve, for Hypertension and thyroid and cholesterol recheck.  Counseling provided for all of the vaccine components Orders Placed This Encounter  Procedures   Bayer DCA Hb A1c Waived   CBC with Differential/Platelet   CMP14+EGFR   Lipid panel   TSH    Arville Care,  MD [facility] 08/10/2023, 11:43 AM

## 2023-08-11 LAB — CMP14+EGFR
ALT: 19 [IU]/L (ref 0–44)
AST: 19 [IU]/L (ref 0–40)
Albumin: 4.5 g/dL (ref 3.8–4.8)
Alkaline Phosphatase: 57 [IU]/L (ref 44–121)
BUN/Creatinine Ratio: 22 (ref 10–24)
BUN: 22 mg/dL (ref 8–27)
Bilirubin Total: 0.6 mg/dL (ref 0.0–1.2)
CO2: 21 mmol/L (ref 20–29)
Calcium: 9.6 mg/dL (ref 8.6–10.2)
Chloride: 105 mmol/L (ref 96–106)
Creatinine, Ser: 1.01 mg/dL (ref 0.76–1.27)
Globulin, Total: 1.9 g/dL (ref 1.5–4.5)
Glucose: 110 mg/dL — ABNORMAL HIGH (ref 70–99)
Potassium: 5.1 mmol/L (ref 3.5–5.2)
Sodium: 142 mmol/L (ref 134–144)
Total Protein: 6.4 g/dL (ref 6.0–8.5)
eGFR: 78 mL/min/{1.73_m2} (ref >59)

## 2023-08-11 LAB — CBC WITH DIFFERENTIAL/PLATELET
Basophils Absolute: 0 10*3/uL (ref 0.0–0.2)
Basos: 1 %
EOS (ABSOLUTE): 0.1 10*3/uL (ref 0.0–0.4)
Eos: 2 %
Hematocrit: 45.2 % (ref 37.5–51.0)
Hemoglobin: 15.1 g/dL (ref 13.0–17.7)
Immature Grans (Abs): 0 10*3/uL (ref 0.0–0.1)
Immature Granulocytes: 0 %
Lymphocytes Absolute: 1.3 10*3/uL (ref 0.7–3.1)
Lymphs: 26 %
MCH: 33.6 pg — ABNORMAL HIGH (ref 26.6–33.0)
MCHC: 33.4 g/dL (ref 31.5–35.7)
MCV: 100 fL — ABNORMAL HIGH (ref 79–97)
Monocytes Absolute: 0.4 10*3/uL (ref 0.1–0.9)
Monocytes: 8 %
Neutrophils Absolute: 3.1 10*3/uL (ref 1.4–7.0)
Neutrophils: 63 %
Platelets: 145 10*3/uL — ABNORMAL LOW (ref 150–450)
RBC: 4.5 x10E6/uL (ref 4.14–5.80)
RDW: 12.8 % (ref 11.6–15.4)
WBC: 4.9 10*3/uL (ref 3.4–10.8)

## 2023-08-11 LAB — LIPID PANEL
Chol/HDL Ratio: 2.5 {ratio} (ref 0.0–5.0)
Cholesterol, Total: 114 mg/dL (ref 100–199)
HDL: 46 mg/dL (ref >39)
LDL Chol Calc (NIH): 53 mg/dL (ref 0–99)
Triglycerides: 75 mg/dL (ref 0–149)
VLDL Cholesterol Cal: 15 mg/dL (ref 5–40)

## 2023-08-11 LAB — TSH: TSH: 1.97 u[IU]/mL (ref 0.450–4.500)

## 2023-08-13 ENCOUNTER — Encounter: Payer: Self-pay | Admitting: Gastroenterology

## 2023-08-14 DIAGNOSIS — D485 Neoplasm of uncertain behavior of skin: Secondary | ICD-10-CM | POA: Diagnosis not present

## 2023-08-14 DIAGNOSIS — C4441 Basal cell carcinoma of skin of scalp and neck: Secondary | ICD-10-CM | POA: Diagnosis not present

## 2023-09-13 ENCOUNTER — Telehealth: Payer: Self-pay

## 2023-09-13 ENCOUNTER — Ambulatory Visit: Payer: Medicare Other

## 2023-09-13 VITALS — Ht 72.0 in | Wt 206.0 lb

## 2023-09-13 DIAGNOSIS — Z1211 Encounter for screening for malignant neoplasm of colon: Secondary | ICD-10-CM

## 2023-09-13 NOTE — Telephone Encounter (Signed)
Unsuccessful at reaching patient for PV apt. 3 VM left. Pt advised to call the office back at 5 PM today to r/s PV in order to avoid cancellation of his colonoscopy with Dr. Tomasa Rand on 12/5.

## 2023-09-13 NOTE — Progress Notes (Signed)
No egg or soy allergy known to patient  No issues known to pt with past sedation with any surgeries or procedures Patient denies ever being told they had issues or difficulty with intubation  No FH of Malignant Hyperthermia Pt is not on diet pills Pt is not on  home 02  Pt is not on blood thinners  Pt reports change in bowel habits since 2020 after having COVID reports hard stools takes OTC fiber  No A fib or A flutter Have any cardiac testing pending--no  LOA: independent  Prep: spilt dose miralax   Patient's chart reviewed by Cathlyn Parsons CNRA prior to previsit and patient appropriate for the [facility].  Previsit completed and red dot placed by patient's name on their procedure day (on provider's schedule).     PV competed with patient. Prep instructions sent via mychart and home address.

## 2023-09-18 DIAGNOSIS — C4441 Basal cell carcinoma of skin of scalp and neck: Secondary | ICD-10-CM | POA: Diagnosis not present

## 2023-10-01 ENCOUNTER — Other Ambulatory Visit: Payer: Self-pay | Admitting: Family Medicine

## 2023-10-01 DIAGNOSIS — I10 Essential (primary) hypertension: Secondary | ICD-10-CM

## 2023-10-01 DIAGNOSIS — E118 Type 2 diabetes mellitus with unspecified complications: Secondary | ICD-10-CM

## 2023-10-04 ENCOUNTER — Ambulatory Visit: Payer: Medicare Other | Admitting: Gastroenterology

## 2023-10-04 ENCOUNTER — Encounter: Payer: Self-pay | Admitting: Gastroenterology

## 2023-10-04 VITALS — BP 117/65 | HR 76 | Temp 97.2°F | Resp 13 | Ht 72.0 in | Wt 206.0 lb

## 2023-10-04 DIAGNOSIS — K5730 Diverticulosis of large intestine without perforation or abscess without bleeding: Secondary | ICD-10-CM | POA: Diagnosis not present

## 2023-10-04 DIAGNOSIS — Z1211 Encounter for screening for malignant neoplasm of colon: Secondary | ICD-10-CM

## 2023-10-04 DIAGNOSIS — E119 Type 2 diabetes mellitus without complications: Secondary | ICD-10-CM | POA: Diagnosis not present

## 2023-10-04 DIAGNOSIS — I10 Essential (primary) hypertension: Secondary | ICD-10-CM | POA: Diagnosis not present

## 2023-10-04 DIAGNOSIS — I2510 Atherosclerotic heart disease of native coronary artery without angina pectoris: Secondary | ICD-10-CM | POA: Diagnosis not present

## 2023-10-04 DIAGNOSIS — E7849 Other hyperlipidemia: Secondary | ICD-10-CM | POA: Diagnosis not present

## 2023-10-04 MED ORDER — SODIUM CHLORIDE 0.9 % IV SOLN: 500.0000 mL | Freq: Once | INTRAVENOUS | Status: AC

## 2023-10-04 NOTE — Progress Notes (Signed)
[facility] History and Physical   Primary Care Physician:  Dettinger, Elige Radon, MD   Reason for Procedure:   Colon cancer screening  Plan:    Colonoscopy     HPI: Bradley Wong is a 77 y.o. male undergoing screening colonoscopy.  He has no family history of colon cancer and no chronic GI symptoms.  His last colonoscopy was in June 2014 and was normal.  He had a tubular adenoma in 2009   Past Medical History:  Diagnosis Date   Arthritis    Coronary artery disease cardiologist--- dr Excell Seltzer   per coronary CTA 01-15-2018, mild nonobstructive CAD;   nuclear stress test 08-05-2012 in epic,  showed normal perfusion w/ no ischemia, ef 69%   History of cancer chemotherapy 2000   for thyroid cancer   History of colon polyps    History of kidney stones    History of thyroid cancer 2000   status post total thyroidectomy and completed chemo same year for papillary cancer ;   followed by Dr Kirtland Bouchard. Hairston (endocrinologist), notes in epic,  no recurrence    HTN (hypertension)    followed by pcp and cardiology   Hyperlipidemia    Hypothyroidism, postsurgical 2000   endocrinologist--- Dr Kirtland Bouchard. Hairston   Mixed hyperlipidemia    followed by dr cooper   Pulmonary nodule    chest CT 07-17-2019 in epic   Right ureteral stone    Type 2 diabetes mellitus (HCC)    followed by pcp---  (06-07-2020 per pt does not check blood sugar)   Wears glasses    Wears partial dentures    upper    Past Surgical History:  Procedure Laterality Date   CYSTOSCOPY WITH STENT PLACEMENT Left 09/20/2018   Procedure: CYSTOSCOPY WITH STENT PLACEMENT;  Surgeon: Alfredo Martinez, MD;  Location: [facility];  Service: Urology;  Laterality: Left;   CYSTOSCOPY/URETEROSCOPY/HOLMIUM LASER/STENT PLACEMENT Right 06/10/2020   Procedure: CYSTOSCOPY RIGHT RETROGRADE URETEROSCOPY/HOLMIUM LASER/STENT PLACEMENT;  Surgeon: Bjorn Pippin, MD;  Location: [facility];  Service: Urology;  Laterality: Right;    EXTRACORPOREAL SHOCK WAVE LITHOTRIPSY Right 05/27/2020   Procedure: RIGHT EXTRACORPOREAL SHOCK WAVE LITHOTRIPSY (ESWL);  Surgeon: Heloise Purpura, MD;  Location[facility];  Service: Urology;  Laterality: Right;   KNEE SURGERY Right 1979   PROSTATE BIOPSY  01/2013   TONSILLECTOMY  child   TOTAL THYROIDECTOMY Bilateral 2000    Prior to Admission medications   Medication Sig Start Date End Date Taking? Authorizing Provider  aspirin EC 81 MG tablet Take 81 mg by mouth daily. Swallow whole.   Yes [provider]  Cholecalciferol (VITAMIN D-3) 5000 UNITS TABS Take 5,000 Units by mouth daily.    Yes [provider]  olmesartan (BENICAR) 5 MG tablet TAKE ONE TABLET ONCE DAILY 10/01/23  Yes Dettinger, Elige Radon, MD  rosuvastatin (CRESTOR) 40 MG tablet TAKE 1/2 TABLET DAILY 10/01/23  Yes Dettinger, Elige Radon, MD  SYNTHROID 112 MCG tablet Take 112 mcg by mouth daily.   Yes [provider]  tadalafil (CIALIS) 5 MG tablet Take 5 mg by mouth daily. 03/27/22  Yes [provider]  tamsulosin (FLOMAX) 0.4 MG CAPS capsule TAKE (1) CAPSULE DAILY Patient taking differently: Take 0.4 mg by mouth daily. TAKE (2) CAPSULE DAILY 08/03/22  Yes Dettinger, Elige Radon, MD  gabapentin (NEURONTIN) 300 MG capsule Take 300 mg by mouth 3 (three) times daily. Patient not taking: Reported on 09/13/2023    [provider]  HYDROcodone-acetaminophen (  NORCO) 10-325 MG tablet Take 0.5 tablets by mouth daily as needed for severe pain. Take 1/2 tablets prn 08/03/22   Dettinger, Elige Radon, MD    Current Outpatient Medications  Medication Sig Dispense Refill   aspirin EC 81 MG tablet Take 81 mg by mouth daily. Swallow whole.     Cholecalciferol (VITAMIN D-3) 5000 UNITS TABS Take 5,000 Units by mouth daily.      olmesartan (BENICAR) 5 MG tablet TAKE ONE TABLET ONCE DAILY 90 tablet 0   rosuvastatin (CRESTOR) 40 MG tablet TAKE 1/2 TABLET DAILY 45 tablet 0   SYNTHROID 112 MCG tablet  Take 112 mcg by mouth daily.     tadalafil (CIALIS) 5 MG tablet Take 5 mg by mouth daily.     tamsulosin (FLOMAX) 0.4 MG CAPS capsule TAKE (1) CAPSULE DAILY (Patient taking differently: Take 0.4 mg by mouth daily. TAKE (2) CAPSULE DAILY) 90 capsule 3   gabapentin (NEURONTIN) 300 MG capsule Take 300 mg by mouth 3 (three) times daily. (Patient not taking: Reported on 09/13/2023)     HYDROcodone-acetaminophen (NORCO) 10-325 MG tablet Take 0.5 tablets by mouth daily as needed for severe pain. Take 1/2 tablets prn 30 tablet 0   Current Facility-Administered Medications  Medication Dose Route Frequency Provider Last Rate Last Admin   0.9 %  sodium chloride infusion  500 mL Intravenous Once Jenel Lucks, MD        Allergies as of 10/04/2023 - Review Complete 10/04/2023  Allergen Reaction Noted   Penicillins Rash 07/30/2012    Family History  Problem Relation Age of Onset   Coronary artery disease Mother 52   Hypertension Mother    Congestive Heart Failure Mother    Esophageal cancer Father    Cancer Father 70       throat and bone cancer   Diabetes Brother        deceased   Heart disease Brother    Kidney failure Brother    Diabetes Brother    Heart disease Brother    Kidney failure Brother    Diabetes Maternal Grandmother    Cancer Paternal Grandfather        leukemia   Colon cancer Neg Hx    Rectal cancer Neg Hx    Stomach cancer Neg Hx     Social History   Socioeconomic History   Marital status: Married    Spouse name: Dondra Spry   Number of children: 4   Years of education: 14   Highest education level: Associate degree: occupational, Scientist, product/process development, or vocational program  Occupational History   Occupation: retired    Associate Professor: Nurse, adult [place]    Comment: Lobbyist work  Tobacco Use   Smoking status: Never   Smokeless tobacco: Never  Vaping Use   Vaping status: Never Used  Substance and Sexual Activity   Alcohol use: Not Currently   Drug use: Never   Sexual  activity: Not on file  Other Topics Concern   Not on file  Social History Narrative   Not on file   Social Determinants of Health   Financial Resource Strain: Low Risk  (04/13/2023)   Overall Financial Resource Strain (CARDIA)    Difficulty of Paying Living Expenses: Not hard at all  Food Insecurity: No Food Insecurity (04/13/2023)   Hunger Vital Sign    Worried About Running Out of Food in the Last Year: Never true    Ran Out of Food in the Last Year: Never true  Transportation Needs:  No Transportation Needs (04/13/2023)   PRAPARE - Administrator, Civil Service (Medical): No    Lack of Transportation (Non-Medical): No  Physical Activity: Insufficiently Active (04/13/2023)   Exercise Vital Sign    Days of Exercise per Week: 3 days    Minutes of Exercise per Session: 30 min  Stress: No Stress Concern Present (04/13/2023)   [employer] of Occupational Health - Occupational Stress Questionnaire    Feeling of Stress : Not at all  Social Connections: Moderately Integrated (04/13/2023)   Social Connection and Isolation Panel [NHANES]    Frequency of Communication with Friends and Family: More than three times a week    Frequency of Social Gatherings with Friends and Family: More than three times a week    Attends Religious Services: More than 4 times per year    Active Member of [employer] or Organizations: No    Attends Banker Meetings: Never    Marital Status: Married  Catering manager Violence: Not At Risk (04/13/2023)   Humiliation, Afraid, Rape, and Kick questionnaire    Fear of Current or Ex-Partner: No    Emotionally Abused: No    Physically Abused: No    Sexually Abused: No    Review of Systems:  All other review of systems negative except as mentioned in the HPI.  Physical Exam: Vital signs BP (!) 119/57   Pulse 77   Temp (!) 97.2 F (36.2 C) (Temporal)   Resp 13   Ht 6' (1.829 m)   Wt 206 lb (93.4 kg)   SpO2 99%   BMI 27.94 kg/m    General:   Alert,  Well-developed, well-nourished, pleasant and cooperative in NAD Airway:  Mallampati 2 Lungs:  Clear throughout to auscultation.   Heart:  Regular rate and rhythm; no murmurs, clicks, rubs,  or gallops. Abdomen:  Soft, nontender and nondistended. Normal bowel sounds.   Neuro/Psych:  Normal mood and affect. A and O x 3   Emiel Kielty E. Tomasa Rand, MD [facility]

## 2023-10-04 NOTE — Progress Notes (Signed)
Pt's states no medical or surgical changes since previsit or office visit. 

## 2023-10-04 NOTE — Patient Instructions (Signed)
YOU HAD AN ENDOSCOPIC PROCEDURE TODAY AT THE [facility]:   Refer to the procedure report that was given to you for any specific questions about what was found during the examination.  If the procedure report does not answer your questions, please call your gastroenterologist to clarify.  If you requested that your care partner not be given the details of your procedure findings, then the procedure report has been included in a sealed envelope for you to review at your convenience later.  YOU SHOULD EXPECT: Some feelings of bloating in the abdomen. Passage of more gas than usual.  Walking can help get rid of the air that was put into your GI tract during the procedure and reduce the bloating. If you had a lower endoscopy (such as a colonoscopy or flexible sigmoidoscopy) you may notice spotting of blood in your stool or on the toilet paper. If you underwent a bowel prep for your procedure, you may not have a normal bowel movement for a few days.  Please Note:  You might notice some irritation and congestion in your nose or some drainage.  This is from the oxygen used during your procedure.  There is no need for concern and it should clear up in a day or so.  SYMPTOMS TO REPORT IMMEDIATELY:  Following lower endoscopy (colonoscopy or flexible sigmoidoscopy):  Excessive amounts of blood in the stool  Significant tenderness or worsening of abdominal pains  Swelling of the abdomen that is new, acute  Fever of 100F or higher   For urgent or emergent issues, a gastroenterologist can be reached at any hour by calling [phone]. Do not use MyChart messaging for urgent concerns.    DIET:  We do recommend a small meal at first, but then you may proceed to your regular diet.  Drink plenty of fluids but you should avoid alcoholic beverages for 24 hours.  MEDICATIONS: Continue present medications.  Please see handouts given to you by your recovery nurse: Diverticulosis.  FOLLOW UP: Given  patient's age and lack of polyps on two consecutive colonoscopies, I would recommend against any further colon cancer screening.  Thank you for allowing [place] to provide for your healthcare needs today.  ACTIVITY:  You should plan to take it easy for the rest of today and you should NOT DRIVE or use heavy machinery until tomorrow (because of the sedation medicines used during the test).    FOLLOW UP: Our staff will call the number listed on your records the next business day following your procedure.  We will call around 7:15- 8:00 am to check on you and address any questions or concerns that you may have regarding the information given to you following your procedure. If we do not reach you, we will leave a message.     If any biopsies were taken you will be contacted by phone or by letter within the next 1-3 weeks.  Please call us at [phone] if you have not heard about the biopsies in 3 weeks.    SIGNATURES/CONFIDENTIALITY: You and/or your care partner have signed paperwork which will be entered into your electronic medical record.  These signatures attest to the fact that that the information above on your After Visit Summary has been reviewed and is understood.  Full responsibility of the confidentiality of this discharge information lies with you and/or your care-partner.

## 2023-10-04 NOTE — Progress Notes (Signed)
Sedate, gd SR, tolerated procedure well, VSS, report to RN 

## 2023-10-04 NOTE — Op Note (Signed)
[facility] Patient Name: Bradley Wong Procedure Date: 10/04/2023 1:00 PM MRN: [id number] Endoscopist: Lorin Picket E. Tomasa Rand , MD, [id number] Age: 77 Referring MD:  Date of Birth: 1945/12/19 Gender: Male Account #: [id number] Procedure:                Colonoscopy Indications:              Screening for colorectal malignant neoplasm (last                            colonoscopy was 10 years ago) Medicines:                Monitored Anesthesia Care Procedure:                Pre-Anesthesia Assessment:                           - Prior to the procedure, a History and Physical                            was performed, and patient medications and                            allergies were reviewed. The patient's tolerance of                            previous anesthesia was also reviewed. The risks                            and benefits of the procedure and the sedation                            options and risks were discussed with the patient.                            All questions were answered, and informed consent                            was obtained. Prior Anticoagulants: The patient has                            taken no anticoagulant or antiplatelet agents. ASA                            Grade Assessment: III - A patient with severe                            systemic disease. After reviewing the risks and                            benefits, the patient was deemed in satisfactory                            condition to undergo the procedure.  After obtaining informed consent, the colonoscope                            was passed under direct vision. Throughout the                            procedure, the patient's blood pressure, pulse, and                            oxygen saturations were monitored continuously. The                            CF [id number] #[id number] was introduced through the anus                            and advanced to  the the terminal ileum, with                            identification of the appendiceal orifice and IC                            valve. The colonoscopy was performed without                            difficulty. The patient tolerated the procedure                            well. The quality of the bowel preparation was                            good. The terminal ileum, ileocecal valve,                            appendiceal orifice, and rectum were photographed.                            The bowel preparation used was Miralax via split                            dose instruction. Scope In: 1:16:27 PM Scope Out: 1:29:01 PM Scope Withdrawal Time: 0 hours 8 minutes 52 seconds  Total Procedure Duration: 0 hours 12 minutes 34 seconds  Findings:                 The perianal and digital rectal examinations were                            normal. Pertinent negatives include normal                            sphincter tone and no palpable rectal lesions.                           Multiple medium-mouthed and small-mouthed  diverticula were found in the sigmoid colon and                            descending colon.                           The exam was otherwise normal throughout the                            examined colon.                           The terminal ileum appeared normal.                           The retroflexed view of the distal rectum and anal                            verge was normal and showed no anal or rectal                            abnormalities. Complications:            No immediate complications. Estimated Blood Loss:     Estimated blood loss: none. Impression:               - Mild diverticulosis in the sigmoid colon and in                            the descending colon.                           - The examined portion of the ileum was normal.                           - The distal rectum and anal verge are normal on                             retroflexion view.                           - No specimens collected. Recommendation:           - Patient has a contact number available for                            emergencies. The signs and symptoms of potential                            delayed complications were discussed with the                            patient. Return to normal activities tomorrow.                            Written discharge instructions were provided to the  patient.                           - Resume previous diet.                           - Continue present medications.                           - Given patient's age and lack of polyps on two                            consecutive colonoscopies, I would recommend                            against any further colon cancer screening. Kimari Lienhard E. Tomasa Rand, MD 10/04/2023 1:37:09 PM This report has been signed electronically.

## 2023-10-05 ENCOUNTER — Telehealth: Payer: Self-pay

## 2023-10-05 NOTE — Telephone Encounter (Signed)
  Follow up Call-     10/04/2023   12:43 PM  Call back number  Post procedure Call Back phone  # [phone]  Permission to leave phone message Yes     Patient questions:  Do you have a fever, pain , or abdominal swelling? No. Pain Score  0 *  Have you tolerated food without any problems? Yes.    Have you been able to return to your normal activities? Yes.    Do you have any questions about your discharge instructions: Diet   No. Medications  No. Follow up visit  No.  Do you have questions or concerns about your Care? No.  Actions: * If pain score is 4 or above: No action needed, pain <4.

## 2023-11-08 DIAGNOSIS — D225 Melanocytic nevi of trunk: Secondary | ICD-10-CM | POA: Diagnosis not present

## 2023-11-08 DIAGNOSIS — L570 Actinic keratosis: Secondary | ICD-10-CM | POA: Diagnosis not present

## 2023-11-08 DIAGNOSIS — L821 Other seborrheic keratosis: Secondary | ICD-10-CM | POA: Diagnosis not present

## 2023-11-08 DIAGNOSIS — L814 Other melanin hyperpigmentation: Secondary | ICD-10-CM | POA: Diagnosis not present

## 2023-11-09 DIAGNOSIS — M5416 Radiculopathy, lumbar region: Secondary | ICD-10-CM | POA: Diagnosis not present

## 2023-11-09 DIAGNOSIS — Z6828 Body mass index (BMI) 28.0-28.9, adult: Secondary | ICD-10-CM | POA: Diagnosis not present

## 2023-11-15 ENCOUNTER — Ambulatory Visit: Payer: Medicare Other | Attending: Cardiovascular Disease | Admitting: Cardiovascular Disease

## 2023-11-15 ENCOUNTER — Encounter: Payer: Self-pay | Admitting: Cardiovascular Disease

## 2023-11-15 VITALS — BP 110/80 | HR 65 | Ht 73.0 in | Wt 212.6 lb

## 2023-11-15 DIAGNOSIS — I2510 Atherosclerotic heart disease of native coronary artery without angina pectoris: Secondary | ICD-10-CM | POA: Insufficient documentation

## 2023-11-15 DIAGNOSIS — E782 Mixed hyperlipidemia: Secondary | ICD-10-CM | POA: Insufficient documentation

## 2023-11-15 DIAGNOSIS — R7303 Prediabetes: Secondary | ICD-10-CM | POA: Insufficient documentation

## 2023-11-15 DIAGNOSIS — I10 Essential (primary) hypertension: Secondary | ICD-10-CM | POA: Insufficient documentation

## 2023-11-15 NOTE — Assessment & Plan Note (Signed)
Blood pressure is well-controlled on olmesartan 10.  Creatinine is normal at 1.0 and potassium is 5.1.

## 2023-11-15 NOTE — Patient Instructions (Signed)

## 2023-11-15 NOTE — Assessment & Plan Note (Signed)
Treated with rosuvastatin 20 mg daily.  Lipids with cholesterol 114, HDL 46, LDL 53, triglycerides 75.

## 2023-11-15 NOTE — Progress Notes (Signed)
Cardiology Office Note:    Date:  11/15/2023   ID:  Bradley Wong, DOB 1946/10/30, MRN [id number]  PCP:  Dettinger, Elige Radon, MD   Bradley Wong Cardiologist:  Bradley Bollman, MD     Referring MD: Dettinger, Elige Radon, MD   No chief complaint on file.   History of Present Illness:    Bradley Wong is a 78 y.o. male with a hx of hypertension and nonobstructive CAD. The patient had a gated coronary CTA in 2019 that showed mild nonobstructive CAD.  He has had hypertension that has been well-controlled and has been compliant with treatment of mixed hyperlipidemia.   The patient is here with his wife today.  Has been doing well from a cardiac perspective.  Notes that he is got a lot of aches and pains with his low back and his knees.  He otherwise is getting along well.  He denies any chest pain, chest pressure, or shortness of breath.  When I saw him last year, he complained of exertional dyspnea and mild leg swelling.  An echocardiogram was done and demonstrated normal LV and RV function with no significant valvular disease.  His symptoms have improved he continues to have ankle edema.  His dyspnea has resolved.  He has no heart palpitations, lightheadedness, or history of syncope.  Current Medications: Current Meds  Medication Sig   aspirin EC 81 MG tablet Take 81 mg by mouth daily. Swallow whole.   Cholecalciferol (VITAMIN D-3) 5000 UNITS TABS Take 5,000 Units by mouth daily.    HYDROcodone-acetaminophen (NORCO) 10-325 MG tablet Take 0.5 tablets by mouth daily as needed for severe pain. Take 1/2 tablets prn   olmesartan (BENICAR) 5 MG tablet TAKE ONE TABLET ONCE DAILY   rosuvastatin (CRESTOR) 40 MG tablet TAKE 1/2 TABLET DAILY   SYNTHROID 112 MCG tablet Take 112 mcg by mouth daily.   tadalafil (CIALIS) 5 MG tablet Take 5 mg by mouth daily.   tamsulosin (FLOMAX) 0.4 MG CAPS capsule TAKE (1) CAPSULE DAILY (Patient taking differently: Take 0.4 mg by mouth daily.  TAKE (2) CAPSULE DAILY)   Current Facility-Administered Medications for the 11/15/23 encounter (Office Visit) with Bradley Bollman, MD  Medication   0.9 %  sodium chloride infusion     Allergies:   Penicillins   ROS:   Please see the history of present illness.    All other systems reviewed and are negative.  EKGs/Labs/Other Studies Reviewed:    The following studies were reviewed today: Cardiac Studies & Procedures      ECHOCARDIOGRAM  ECHOCARDIOGRAM COMPLETE 05/17/2023  Narrative ECHOCARDIOGRAM REPORT    Patient Name:   Bradley Wong Date of Exam: 05/17/2023 Medical Rec #:  [id number]        Height:       73.0 in Accession #:    [id number]       Weight:       208.8 lb Date of Birth:  25-Jul-1946       BSA:          2.191 m Patient Age:    76 years         BP:           114/78 mmHg Patient Gender: M                HR:           60 bpm. Exam Location:  [address]  Procedure: 2D Echo, 3D Echo, Cardiac Doppler  and Color Doppler  Indications:    R06.00 Dyspnea  History:        Patient has prior history of Echocardiogram examinations, most recent 07/23/2020. CHF, CAD, Signs/Symptoms:Dyspnea and Edema; Risk Factors:Family History of Coronary Artery Disease, Hypertension, Dyslipidemia and Diabetes. History of Thyroid Cancer status post Chemotherapy (2000).  Sonographer:    Bradley Wong RDCS Referring Phys: Bradley Wong  IMPRESSIONS   1. Left ventricular ejection fraction, by estimation, is 55 to 60%. The left ventricle has normal function. The left ventricle has no regional wall motion abnormalities. Left ventricular diastolic parameters are consistent with Grade I diastolic dysfunction (impaired relaxation). 2. Right ventricular systolic function is normal. The right ventricular size is normal. There is normal pulmonary artery systolic pressure. The estimated right ventricular systolic pressure is 27.4 mmHg. 3. The mitral valve is normal in structure. Mild  mitral valve regurgitation. No evidence of mitral stenosis. 4. The aortic valve is calcified. There is mild calcification of the aortic valve. Aortic valve regurgitation is trivial. Aortic valve sclerosis/calcification is present, without any evidence of aortic stenosis. 5. Aortic dilatation noted. There is borderline dilatation of the aortic root, measuring 38 mm. 6. The inferior vena cava is normal in size with greater than 50% respiratory variability, suggesting right atrial pressure of 3 mmHg.  FINDINGS Left Ventricle: Left ventricular ejection fraction, by estimation, is 55 to 60%. The left ventricle has normal function. The left ventricle has no regional wall motion abnormalities. The left ventricular internal cavity size was normal in size. There is no left ventricular hypertrophy. Left ventricular diastolic parameters are consistent with Grade I diastolic dysfunction (impaired relaxation).  Right Ventricle: The right ventricular size is normal. No increase in right ventricular wall thickness. Right ventricular systolic function is normal. There is normal pulmonary artery systolic pressure. The tricuspid regurgitant velocity is 2.47 m/s, and with an assumed right atrial pressure of 3 mmHg, the estimated right ventricular systolic pressure is 27.4 mmHg.  Left Atrium: Left atrial size was normal in size.  Right Atrium: Right atrial size was normal in size.  Pericardium: There is no evidence of pericardial effusion.  Mitral Valve: The mitral valve is normal in structure. Mild mitral valve regurgitation. No evidence of mitral valve stenosis.  Tricuspid Valve: The tricuspid valve is normal in structure. Tricuspid valve regurgitation is trivial. No evidence of tricuspid stenosis.  Aortic Valve: The aortic valve is calcified. There is mild calcification of the aortic valve. Aortic valve regurgitation is trivial. Aortic regurgitation PHT measures 249 msec. Aortic valve sclerosis/calcification is  present, without any evidence of aortic stenosis.  Pulmonic Valve: The pulmonic valve was normal in structure. Pulmonic valve regurgitation is trivial. No evidence of pulmonic stenosis.  Aorta: Aortic dilatation noted. There is borderline dilatation of the aortic root, measuring 38 mm.  Venous: The inferior vena cava is normal in size with greater than 50% respiratory variability, suggesting right atrial pressure of 3 mmHg.  IAS/Shunts: No atrial level shunt detected by color flow Doppler.   LEFT VENTRICLE PLAX 2D LVIDd:         4.80 cm   Diastology LVIDs:         3.05 cm   LV e' medial:    7.71 cm/s LV PW:         0.70 cm   LV E/e' medial:  8.9 LV IVS:        0.80 cm   LV e' lateral:   9.70 cm/s LVOT diam:     2.20  cm   LV E/e' lateral: 7.1 LV SV:         68 LV SV Index:   31 LVOT Area:     3.80 cm  3D Volume EF: 3D EF:        68 % LV EDV:       144 ml LV ESV:       45 ml LV SV:        98 ml  RIGHT VENTRICLE RV Basal diam:  3.70 cm RV S prime:     16.40 cm/s TAPSE (M-mode): 2.6 cm RVSP:           27.4 mmHg  LEFT ATRIUM             Index        RIGHT ATRIUM           Index LA diam:        4.30 cm 1.96 cm/m   RA Pressure: 3.00 mmHg LA Vol (A2C):   62.6 ml 28.57 ml/m  RA Area:     18.20 cm LA Vol (A4C):   54.5 ml 24.87 ml/m  RA Volume:   49.70 ml  22.68 ml/m LA Biplane Vol: 59.5 ml 27.15 ml/m AORTIC VALVE LVOT Vmax:   79.75 cm/s LVOT Vmean:  51.300 cm/s LVOT VTI:    0.180 m AI PHT:      249 msec  AORTA Ao Root diam: 3.80 cm Ao Asc diam:  3.60 cm  MITRAL VALVE               TRICUSPID VALVE MV Area (PHT): cm         TR Peak grad:   24.4 mmHg MV Decel Time: 203 msec    TR Vmax:        247.00 cm/s MV E velocity: 68.40 cm/s  Estimated RAP:  3.00 mmHg MV A velocity: 70.50 cm/s  RVSP:           27.4 mmHg MV E/A ratio:  0.97 SHUNTS Systemic VTI:  0.18 m Systemic Diam: 2.20 cm  Arvilla Meres MD Electronically signed by Arvilla Meres MD Signature  Date/Time: 05/17/2023/3:26:00 PM    Final    CT SCANS  CT CORONARY MORPH W/CTA COR W/SCORE 01/15/2018  Addendum 01/17/2018  7:36 PM ADDENDUM REPORT: 01/17/2018 19:34  CLINICAL DATA:  78 year old male with hyperlipidemia, hypertension, diabetes and DOE.  EXAM: Cardiac/Coronary  CT  TECHNIQUE: The patient was scanned on a [employer].  FINDINGS: A 120 kV prospective scan was triggered in the descending thoracic aorta at 111 HU's. Axial non-contrast 3 mm slices were carried out through the heart. The data set was analyzed on a dedicated work station and scored using the Agatson method. Gantry rotation speed was 250 msecs and collimation was .6 mm. No beta blockade and 0.8 mg of sl NTG was given. The 3D data set was reconstructed in 5% intervals of the 67-82 % of the R-R cycle. Diastolic phases were analyzed on a dedicated work station using MPR, MIP and VRT modes. The patient received 80 cc of contrast.  Aorta: Normal size. Trivial calcifications and atherosclerosis. No dissection.  Aortic Valve:  Trileaflet.  No calcifications.  Coronary Arteries:  Normal coronary origin.  Right dominance.  RCA is a medium size dominant artery that gives rise to PDA and PLVB. There is minimal plaque.  Left main is a short artery that bifurcates into LAD and LCX arteries. There is minimal ostial eccentric calcified plaque with  associated stenosis 0-25%.  LAD is a large vessel that gives rise to one diagonal artery. There is mild calcified plaque in the proximal segment with associated stenosis 25-50%. Mid, distal LAD and diagonal artery have no significant stenosis.  LCX is a non-dominant artery that gives rise to one large OM1 branch. There is no plaque.  Other findings:  Normal pulmonary vein drainage into the left atrium.  Normal let atrial appendage without a thrombus.  Normal size of the pulmonary artery.  IMPRESSION: 1. Coronary calcium score of 35. This was  31 percentile for age and sex matched control.  2. Normal coronary origin with right dominance.  3. Mild non-obstructive CAD in the left main and proximal LAD.   Electronically Signed By: Tobias Alexander On: 01/17/2018 19:34  Narrative EXAM: OVER-READ INTERPRETATION  CT CHEST  The following report is an over-read performed by radiologist Dr. Trudie Reed of [facility] on 01/15/2018. This over-read does not include interpretation of cardiac or coronary anatomy or pathology. The coronary calcium score/coronary CTA interpretation by the cardiologist is attached.  COMPARISON:  None.  FINDINGS: Aortic atherosclerosis. 3 mm subpleural nodule in the periphery of the right middle lobe (axial image 30 of series 12). Scattered areas of subsegmental atelectasis or mild scarring in the dependent portions of the lower lobes of the lungs bilaterally. Within the visualized portions of the thorax there are no suspicious appearing pulmonary nodules or masses, there is no acute consolidative airspace disease, no pleural effusions, no pneumothorax and no lymphadenopathy. Visualized portions of the upper abdomen are unremarkable. There are no aggressive appearing lytic or blastic lesions noted in the visualized portions of the skeleton.  IMPRESSION: 1.  Aortic Atherosclerosis (ICD10-I70.0). 2. 3 mm subpleural nodule in the periphery of the right middle lobe, nonspecific but statistically likely benign. No follow-up needed if patient is low-risk. Non-contrast chest CT can be considered in 12 months if patient is high-risk. This recommendation follows the consensus statement: Guidelines for Management of Incidental Pulmonary Nodules Detected on CT Images: From the Fleischner Society 2017; Radiology 2017; 284:228-243.  Electronically Signed: By: Trudie Reed M.D. On: 01/15/2018 13:18          EKG:        Recent Labs: 08/10/2023: ALT 19; BUN 22; Creatinine, Ser  1.01; Hemoglobin 15.1; Platelets 145; Potassium 5.1; Sodium 142; TSH 1.970  Recent Lipid Panel    Component Value Date/Time   CHOL 114 08/10/2023 1254   CHOL 94 04/30/2013 0810   TRIG 75 08/10/2023 1254   TRIG 62 04/18/2017 1050   TRIG 53 04/30/2013 0810   HDL 46 08/10/2023 1254   HDL 44 04/18/2017 1050   HDL 36 (L) 04/30/2013 0810   CHOLHDL 2.5 08/10/2023 1254   LDLCALC 53 08/10/2023 1254   LDLCALC 39 05/12/2014 0841   LDLCALC 47 04/30/2013 0810     Risk Assessment/Calculations:                Physical Exam:    VS:  BP 110/80   Pulse 65   Ht 6\' 1"  (1.854 m)   Wt 212 lb 9.6 oz (96.4 kg)   SpO2 97%   BMI 28.05 kg/m     Wt Readings from Last 3 Encounters:  11/15/23 212 lb 9.6 oz (96.4 kg)  10/04/23 206 lb (93.4 kg)  09/13/23 206 lb (93.4 kg)     GEN:  Well nourished, well developed in no acute distress HEENT: Normal NECK: No JVD; No carotid bruits  LYMPHATICS: No lymphadenopathy CARDIAC: RRR, no murmurs, rubs, gallops RESPIRATORY:  Clear to auscultation without rales, wheezing or rhonchi  ABDOMEN: Soft, non-tender, non-distended MUSCULOSKELETAL:  No edema; No deformity  SKIN: Warm and dry NEUROLOGIC:  Alert and oriented x 3 PSYCHIATRIC:  Normal affect   Assessment & Plan Essential hypertension, benign Blood pressure is well-controlled on olmesartan 10.  Creatinine is normal at 1.0 and potassium is 5.1. Mixed hyperlipidemia Treated with rosuvastatin 20 mg daily.  Lipids with cholesterol 114, HDL 46, LDL 53, triglycerides 75. Coronary artery disease involving native coronary artery of native heart without angina pectoris Past coronary CTA showed mild nonobstructive coronary plaquing with no significant stenoses.  The patient is having no angina.  He will remain on aspirin and rosuvastatin. Prediabetes Hemoglobin A1c 6.4.  Patient right on the border of diabetes.  We discussed lifestyle modification with focus on weight loss through reduced caloric intake,  increased exercise as tolerated, reduction in simple sugars and sweets, and diet consisting of nutrient rich whole foods.  He will continue to follow with his primary care physician.      Medication Adjustments/Labs and Tests Ordered: Current medicines are reviewed at length with the patient today.  Concerns regarding medicines are outlined above.  Orders Placed This Encounter  Procedures   EKG 12-Lead   No orders of the defined types were placed in this encounter.   Patient Instructions  Follow-Up: At [facility], you and your health needs are our priority.  As part of our continuing mission to provide you with exceptional heart care, we have created designated Provider Care Teams.  These Care Teams include your primary Cardiologist (physician) and [facility] (APPs -  Physician Assistants and Nurse Practitioners) who all work together to provide you with the care you need, when you need it.  Your next appointment:   1 year(s)  Provider:   Tonny Bollman, MD             Signed, Bradley Bollman, MD  11/15/2023 3:24 PM    [facility]

## 2023-11-29 DIAGNOSIS — M5416 Radiculopathy, lumbar region: Secondary | ICD-10-CM | POA: Diagnosis not present

## 2023-12-25 ENCOUNTER — Other Ambulatory Visit: Payer: Self-pay | Admitting: Family Medicine

## 2023-12-25 DIAGNOSIS — E118 Type 2 diabetes mellitus with unspecified complications: Secondary | ICD-10-CM

## 2023-12-25 DIAGNOSIS — I10 Essential (primary) hypertension: Secondary | ICD-10-CM

## 2023-12-28 DIAGNOSIS — M5416 Radiculopathy, lumbar region: Secondary | ICD-10-CM | POA: Diagnosis not present

## 2023-12-28 DIAGNOSIS — Z6828 Body mass index (BMI) 28.0-28.9, adult: Secondary | ICD-10-CM | POA: Diagnosis not present

## 2024-02-08 ENCOUNTER — Encounter: Payer: Self-pay | Admitting: Family Medicine

## 2024-02-08 ENCOUNTER — Ambulatory Visit: Payer: Medicare Other | Admitting: Family Medicine

## 2024-02-08 VITALS — BP 121/80 | HR 72 | Ht 73.0 in | Wt 208.0 lb

## 2024-02-08 DIAGNOSIS — E7800 Pure hypercholesterolemia, unspecified: Secondary | ICD-10-CM | POA: Diagnosis not present

## 2024-02-08 DIAGNOSIS — Z125 Encounter for screening for malignant neoplasm of prostate: Secondary | ICD-10-CM | POA: Diagnosis not present

## 2024-02-08 DIAGNOSIS — Z79899 Other long term (current) drug therapy: Secondary | ICD-10-CM | POA: Diagnosis not present

## 2024-02-08 DIAGNOSIS — E118 Type 2 diabetes mellitus with unspecified complications: Secondary | ICD-10-CM | POA: Diagnosis not present

## 2024-02-08 DIAGNOSIS — E890 Postprocedural hypothyroidism: Secondary | ICD-10-CM

## 2024-02-08 DIAGNOSIS — R972 Elevated prostate specific antigen [PSA]: Secondary | ICD-10-CM

## 2024-02-08 DIAGNOSIS — E1159 Type 2 diabetes mellitus with other circulatory complications: Secondary | ICD-10-CM

## 2024-02-08 DIAGNOSIS — E1169 Type 2 diabetes mellitus with other specified complication: Secondary | ICD-10-CM | POA: Diagnosis not present

## 2024-02-08 DIAGNOSIS — R351 Nocturia: Secondary | ICD-10-CM | POA: Diagnosis not present

## 2024-02-08 DIAGNOSIS — I10 Essential (primary) hypertension: Secondary | ICD-10-CM

## 2024-02-08 DIAGNOSIS — R7303 Prediabetes: Secondary | ICD-10-CM

## 2024-02-08 DIAGNOSIS — E785 Hyperlipidemia, unspecified: Secondary | ICD-10-CM | POA: Diagnosis not present

## 2024-02-08 LAB — BAYER DCA HB A1C WAIVED: HB A1C (BAYER DCA - WAIVED): 6.4 % — ABNORMAL HIGH (ref 4.8–5.6)

## 2024-02-08 MED ORDER — OLMESARTAN MEDOXOMIL 5 MG PO TABS: 5.0000 mg | ORAL_TABLET | Freq: Every day | ORAL | 0 refills | Status: DC

## 2024-02-08 MED ORDER — ROSUVASTATIN CALCIUM 40 MG PO TABS: 20.0000 mg | ORAL_TABLET | Freq: Every day | ORAL | 3 refills | Status: AC

## 2024-02-08 MED ORDER — TAMSULOSIN HCL 0.4 MG PO CAPS: 0.4000 mg | ORAL_CAPSULE | Freq: Every day | ORAL | 1 refills | Status: AC

## 2024-02-08 NOTE — Progress Notes (Addendum)
 BP 121/80   Pulse 72   Ht 6\' 1"  (1.854 m)   Wt 208 lb (94.3 kg)   SpO2 97%   BMI 27.44 kg/m    Subjective:   Patient ID: Bradley Wong, male    DOB: 05-02-1946, 78 y.o.   MRN: [id number]  HPI: Bradley Wong is a 78 y.o. male presenting on 02/08/2024 for Medical Management of Chronic Issues, Diabetes, Hypertension, and Hypothyroidism   HPI Controlled type 2 diabetes Patient comes in today for recheck of his diabetes. Patient has been currently taking no medication currently, diet control. Patient is currently on an ACE inhibitor/ARB. Patient has not seen an ophthalmologist this year. Patient denies any new issues with their feet. The symptom started onset as an adult hypertension and hyperlipidemia and hypothyroidism ARE RELATED TO DM   Hypertension Patient is currently on olmesartan , and their blood pressure today is 121/80. Patient denies any lightheadedness or dizziness. Patient denies headaches, blurred vision, chest pains, shortness of breath, or weakness. Denies any side effects from medication and is content with current medication.   Hyperlipidemia Patient is coming in for recheck of his hyperlipidemia. The patient is currently taking Crestor . They deny any issues with myalgias or history of liver damage from it. They deny any focal numbness or weakness or chest pain.   Hypothyroidism recheck Patient is coming in for thyroid  recheck today as well. They deny any issues with hair changes or heat or cold problems or diarrhea or constipation. They deny any chest pain or palpitations. They are currently on levothyroxine  112 micrograms   Relevant past medical, surgical, family and social history reviewed and updated as indicated. Interim medical history since our last visit reviewed. Allergies and medications reviewed and updated.  Review of Systems  Constitutional:  Negative for chills and fever.  Eyes:  Negative for visual disturbance.  Respiratory:  Negative for  shortness of breath and wheezing.   Cardiovascular:  Negative for chest pain and leg swelling.  Musculoskeletal:  Negative for back pain and gait problem.  Skin:  Negative for rash.  Neurological:  Negative for dizziness and light-headedness.  All other systems reviewed and are negative.   Per HPI unless specifically indicated above   Allergies as of 02/08/2024       Reactions   Penicillins Rash   Has patient had a PCN reaction causing immediate rash, facial/tongue/throat swelling, SOB or lightheadedness with hypotension: No Has patient had a PCN reaction causing severe rash involving mucus membranes or skin necrosis: No Has patient had a PCN reaction that required hospitalization: Unknown Has patient had a PCN reaction occurring within the last 10 years: No If all of the above answers are "NO", then may proceed with Cephalosporin use.        Medication List        Accurate as of February 08, 2024 11:21 AM. If you have any questions, ask your nurse or doctor.          STOP taking these medications    tadalafil 5 MG tablet Commonly known as: CIALIS Stopped by: Lucio Sabin Babbette Dalesandro       TAKE these medications    aspirin EC 81 MG tablet Take 81 mg by mouth daily. Swallow whole.   HYDROcodone -acetaminophen  10-325 MG tablet Commonly known as: NORCO Take 0.5 tablets by mouth daily as needed for severe pain. Take 1/2 tablets prn   olmesartan  5 MG tablet Commonly known as: BENICAR  Take 1 tablet (5 mg total) by  mouth daily.   rosuvastatin  40 MG tablet Commonly known as: CRESTOR  Take 0.5 tablets (20 mg total) by mouth daily.   Synthroid  112 MCG tablet Generic drug: levothyroxine  Take 112 mcg by mouth daily.   tamsulosin  0.4 MG Caps capsule Commonly known as: FLOMAX  Take 1 capsule (0.4 mg total) by mouth daily. TAKE (2) CAPSULE DAILY   Vitamin D -3 125 MCG (5000 UT) Tabs Take 5,000 Units by mouth daily.         Objective:   BP 121/80   Pulse 72   Ht 6'  1" (1.854 m)   Wt 208 lb (94.3 kg)   SpO2 97%   BMI 27.44 kg/m   Wt Readings from Last 3 Encounters:  02/08/24 208 lb (94.3 kg)  11/15/23 212 lb 9.6 oz (96.4 kg)  10/04/23 206 lb (93.4 kg)    Physical Exam Vitals and nursing note reviewed.  Constitutional:      General: He is not in acute distress.    Appearance: He is well-developed. He is not diaphoretic.  Eyes:     General: No scleral icterus.    Conjunctiva/sclera: Conjunctivae normal.  Neck:     Thyroid : No thyromegaly.  Cardiovascular:     Rate and Rhythm: Normal rate and regular rhythm.     Heart sounds: Normal heart sounds. No murmur heard. Pulmonary:     Effort: Pulmonary effort is normal. No respiratory distress.     Breath sounds: Normal breath sounds. No wheezing.  Musculoskeletal:        General: Normal range of motion.     Cervical back: Neck supple.  Lymphadenopathy:     Cervical: No cervical adenopathy.  Skin:    General: Skin is warm and dry.     Findings: No rash.  Neurological:     Mental Status: He is alert and oriented to person, place, and time.     Coordination: Coordination normal.  Psychiatric:        Behavior: Behavior normal.       Assessment & Plan:   Problem List Items Addressed This Visit       Cardiovascular and Mediastinum   Essential hypertension, benign (Chronic)   Relevant Medications   olmesartan  (BENICAR ) 5 MG tablet   rosuvastatin  (CRESTOR ) 40 MG tablet   Other Relevant Orders   CBC with Differential/Platelet (Completed)   CMP14+EGFR (Completed)   Lipid panel (Completed)   TSH (Completed)   Bayer DCA Hb A1c Waived (Completed)     Endocrine   Hyperlipidemia associated with type 2 diabetes mellitus (HCC)   Relevant Medications   olmesartan  (BENICAR ) 5 MG tablet   rosuvastatin  (CRESTOR ) 40 MG tablet   Hypothyroidism, postsurgical   Relevant Orders   CBC with Differential/Platelet (Completed)   CMP14+EGFR (Completed)   Lipid panel (Completed)   TSH (Completed)    Bayer DCA Hb A1c Waived (Completed)     Other   Elevated PSA   Other Visit Diagnoses       Controlled type 2 diabetes mellitus with complication, without long-term current use of insulin (HCC)    -  Primary   Relevant Medications   olmesartan  (BENICAR ) 5 MG tablet   rosuvastatin  (CRESTOR ) 40 MG tablet   Other Relevant Orders   CBC with Differential/Platelet (Completed)   CMP14+EGFR (Completed)   Lipid panel (Completed)   TSH (Completed)   Bayer DCA Hb A1c Waived (Completed)   Microalbumin/Creatinine Ratio, Urine     Controlled substance agreement signed  Nocturia         Prostate cancer screening       Relevant Orders   PR PSA SCREENING       Seems to be doing well for the most part, rarely uses the hydrocodone  so does not need a refill today.  Will discuss next time.  A1c is 6.1, looks good Follow up plan: Return in about 6 months (around 08/09/2024), or if symptoms worsen or fail to improve, for Prediabetes hypertension and cholesterol..  Counseling provided for all of the vaccine components Orders Placed This Encounter  Procedures   CBC with Differential/Platelet   CMP14+EGFR   Lipid panel   TSH   Bayer DCA Hb A1c Waived   Microalbumin/Creatinine Ratio, Urine   PR PSA SCREENING    Jolyne Needs, MD Vickie Grana [facility] 02/08/2024, 11:21 AM

## 2024-02-09 LAB — LIPID PANEL

## 2024-02-10 LAB — CMP14+EGFR
ALT: 22 IU/L (ref 0–44)
AST: 20 IU/L (ref 0–40)
Albumin: 4.4 g/dL (ref 3.8–4.8)
Alkaline Phosphatase: 61 IU/L (ref 44–121)
BUN/Creatinine Ratio: 17 (ref 10–24)
BUN: 18 mg/dL (ref 8–27)
Bilirubin Total: 0.6 mg/dL (ref 0.0–1.2)
CO2: 19 mmol/L — ABNORMAL LOW (ref 20–29)
Calcium: 9.5 mg/dL (ref 8.6–10.2)
Chloride: 105 mmol/L (ref 96–106)
Creatinine, Ser: 1.06 mg/dL (ref 0.76–1.27)
Globulin, Total: 1.8 g/dL (ref 1.5–4.5)
Glucose: 104 mg/dL — ABNORMAL HIGH (ref 70–99)
Potassium: 4.9 mmol/L (ref 3.5–5.2)
Sodium: 142 mmol/L (ref 134–144)
Total Protein: 6.2 g/dL (ref 6.0–8.5)
eGFR: 73 mL/min/{1.73_m2} (ref >59)

## 2024-02-10 LAB — LIPID PANEL
Cholesterol, Total: 115 mg/dL (ref 100–199)
HDL: 47 mg/dL (ref >39)
LDL CALC COMMENT:: 2.4 ratio (ref 0.0–5.0)
LDL Chol Calc (NIH): 49 mg/dL (ref 0–99)
Triglycerides: 104 mg/dL (ref 0–149)
VLDL Cholesterol Cal: 19 mg/dL (ref 5–40)

## 2024-02-10 LAB — CBC WITH DIFFERENTIAL/PLATELET
Basophils Absolute: 0 10*3/uL (ref 0.0–0.2)
Basos: 0 %
EOS (ABSOLUTE): 0.1 10*3/uL (ref 0.0–0.4)
Eos: 1 %
Hematocrit: 44 % (ref 37.5–51.0)
Hemoglobin: 14.7 g/dL (ref 13.0–17.7)
Immature Grans (Abs): 0 10*3/uL (ref 0.0–0.1)
Immature Granulocytes: 0 %
Lymphocytes Absolute: 1.3 10*3/uL (ref 0.7–3.1)
Lymphs: 23 %
MCH: 33.4 pg — ABNORMAL HIGH (ref 26.6–33.0)
MCHC: 33.4 g/dL (ref 31.5–35.7)
MCV: 100 fL — ABNORMAL HIGH (ref 79–97)
Monocytes Absolute: 0.4 10*3/uL (ref 0.1–0.9)
Monocytes: 7 %
Neutrophils Absolute: 3.9 10*3/uL (ref 1.4–7.0)
Neutrophils: 69 %
Platelets: 159 10*3/uL (ref 150–450)
RBC: 4.4 x10E6/uL (ref 4.14–5.80)
RDW: 13 % (ref 11.6–15.4)
WBC: 5.7 10*3/uL (ref 3.4–10.8)

## 2024-02-10 LAB — TSH: TSH: 0.824 u[IU]/mL (ref 0.450–4.500)

## 2024-02-12 ENCOUNTER — Encounter: Payer: Self-pay | Admitting: Family Medicine

## 2024-02-12 NOTE — Progress Notes (Signed)
 Needs to collect DKE at some point. Slight elevations on MCV/MCH, stable. Mild elevation on BG, consistent with A1C, which is controlled. Continue current regimen. CO2 slightly decreased. Not concerning at this time. Will continue to monitor. Cholesterol levels are within normal range. Continue crestor.

## 2024-02-20 DIAGNOSIS — N401 Enlarged prostate with lower urinary tract symptoms: Secondary | ICD-10-CM | POA: Diagnosis not present

## 2024-02-20 DIAGNOSIS — N5201 Erectile dysfunction due to arterial insufficiency: Secondary | ICD-10-CM | POA: Diagnosis not present

## 2024-02-20 DIAGNOSIS — R350 Frequency of micturition: Secondary | ICD-10-CM | POA: Diagnosis not present

## 2024-02-20 DIAGNOSIS — R351 Nocturia: Secondary | ICD-10-CM | POA: Diagnosis not present

## 2024-04-15 ENCOUNTER — Ambulatory Visit (INDEPENDENT_AMBULATORY_CARE_PROVIDER_SITE_OTHER): Payer: Medicare Other

## 2024-04-15 VITALS — BP 148/84 | HR 64 | Ht 73.0 in | Wt 208.0 lb

## 2024-04-15 DIAGNOSIS — Z0000 Encounter for general adult medical examination without abnormal findings: Secondary | ICD-10-CM

## 2024-04-15 DIAGNOSIS — Z135 Encounter for screening for eye and ear disorders: Secondary | ICD-10-CM | POA: Insufficient documentation

## 2024-04-15 NOTE — Progress Notes (Signed)
 Subjective:   Bradley Wong is a 78 y.o. who presents for a Medicare Wellness preventive visit.  As a reminder, Annual Wellness Visits don't include a physical exam, and some assessments may be limited, especially if this visit is performed virtually. We may recommend an in-person follow-up visit with your provider if needed.  Visit Complete: Virtual I connected with  Bradley Wong on 04/15/24 by a audio enabled telemedicine application and verified that I am speaking with the correct person using two identifiers.  Patient Location: Home  Provider Location: Home Office  I discussed the limitations of evaluation and management by telemedicine. The patient expressed understanding and agreed to proceed.  Vital Signs: Because this visit was a virtual/telehealth visit, some criteria may be missing or patient reported. Any vitals not documented were not able to be obtained and vitals that have been documented are patient reported.  VideoDeclined- This patient declined Librarian, academic. Therefore the visit was completed with audio only.  Persons Participating in Visit: Patient.  AWV Questionnaire: No: Patient Medicare AWV questionnaire was not completed prior to this visit.  Cardiac Risk Factors include: advanced age (>73men, >72 women);diabetes mellitus;dyslipidemia;hypertension;male gender     Objective:    Today's Vitals   04/15/24 0803  BP: (!) 148/84  Pulse: 64  Weight: 208 lb (94.3 kg)  Height: 6' 1 (1.854 m)   Body mass index is 27.44 kg/m.     04/15/2024    8:12 AM 05/30/2023    3:10 PM 04/13/2023    8:18 AM 04/11/2022   10:13 AM 04/08/2021   10:16 AM 06/10/2020    9:00 AM 04/07/2020    9:30 AM  Advanced Directives  Does Patient Have a Medical Advance Directive? Yes Yes No No No No Yes  Type of Estate agent of [place];Living will      Living will;Healthcare Power of Attorney  Copy of Healthcare Power of Attorney  in Chart? No - copy requested        Would patient like information on creating a medical advance directive?   No - Patient declined No - Patient declined Yes (MAU/Ambulatory/Procedural Areas - Information given) No - Patient declined     Current Medications (verified) Outpatient Encounter Medications as of 04/15/2024  Medication Sig   aspirin EC 81 MG tablet Take 81 mg by mouth daily. Swallow whole.   Cholecalciferol (VITAMIN D -3) 5000 UNITS TABS Take 5,000 Units by mouth daily.    HYDROcodone -acetaminophen  (NORCO) 10-325 MG tablet Take 0.5 tablets by mouth daily as needed for severe pain. Take 1/2 tablets prn   olmesartan  (BENICAR ) 5 MG tablet Take 1 tablet (5 mg total) by mouth daily.   rosuvastatin  (CRESTOR ) 40 MG tablet Take 0.5 tablets (20 mg total) by mouth daily.   SYNTHROID  112 MCG tablet Take 112 mcg by mouth daily.   tamsulosin  (FLOMAX ) 0.4 MG CAPS capsule Take 1 capsule (0.4 mg total) by mouth daily. TAKE (2) CAPSULE DAILY   Facility-Administered Encounter Medications as of 04/15/2024  Medication   0.9 %  sodium chloride  infusion    Allergies (verified) Penicillins   History: Past Medical History:  Diagnosis Date   Arthritis    Coronary artery disease cardiologist--- dr Arlester Ladd   per coronary CTA 01-15-2018, mild nonobstructive CAD;   nuclear stress test 08-05-2012 in epic,  showed normal perfusion w/ no ischemia, ef 69%   History of cancer chemotherapy 2000   for thyroid  cancer   History of colon polyps  History of kidney stones    History of thyroid  cancer 2000   status post total thyroidectomy and completed chemo same year for papillary cancer ;   followed by Dr Linnell Richardson. Hairston (endocrinologist), notes in epic,  no recurrence    HTN (hypertension)    followed by pcp and cardiology   Hyperlipidemia    Hypothyroidism, postsurgical 2000   endocrinologist--- Dr Linnell Richardson. Hairston   Mixed hyperlipidemia    followed by dr cooper   Pulmonary nodule    chest CT 07-17-2019 in  epic   Right ureteral stone    Type 2 diabetes mellitus (HCC)    followed by pcp---  (06-07-2020 per pt does not check blood sugar)   Wears glasses    Wears partial dentures    upper   Past Surgical History:  Procedure Laterality Date   CYSTOSCOPY WITH STENT PLACEMENT Left 09/20/2018   Procedure: CYSTOSCOPY WITH STENT PLACEMENT;  Surgeon: Erman Hayward, MD;  Location: [facility];  Service: Urology;  Laterality: Left;   CYSTOSCOPY/URETEROSCOPY/HOLMIUM LASER/STENT PLACEMENT Right 06/10/2020   Procedure: CYSTOSCOPY RIGHT RETROGRADE URETEROSCOPY/HOLMIUM LASER/STENT PLACEMENT;  Surgeon: Homero Luster, MD;  Location: [facility];  Service: Urology;  Laterality: Right;   EXTRACORPOREAL SHOCK WAVE LITHOTRIPSY Right 05/27/2020   Procedure: RIGHT EXTRACORPOREAL SHOCK WAVE LITHOTRIPSY (ESWL);  Surgeon: Florencio Hunting, MD;  Location[facility];  Service: Urology;  Laterality: Right;   KNEE SURGERY Right 1979   PROSTATE BIOPSY  01/2013   TONSILLECTOMY  child   TOTAL THYROIDECTOMY Bilateral 2000   Family History  Problem Relation Age of Onset   Coronary artery disease Mother 30   Hypertension Mother    Congestive Heart Failure Mother    Esophageal cancer Father    Cancer Father 65       throat and bone cancer   Diabetes Brother        deceased   Heart disease Brother    Kidney failure Brother    Diabetes Brother    Heart disease Brother    Kidney failure Brother    Diabetes Maternal Grandmother    Cancer Paternal Grandfather        leukemia   Colon cancer Neg Hx    Rectal cancer Neg Hx    Stomach cancer Neg Hx    Social History   Socioeconomic History   Marital status: Married    Spouse name: Gregary Lean   Number of children: 4   Years of education: 14   Highest education level: Associate degree: occupational, Scientist, product/process development, or vocational program  Occupational History   Occupation: retired    Associate Professor: Nurse, adult [place]    Comment: Lobbyist work  Tobacco Use    Smoking status: Never   Smokeless tobacco: Never  Vaping Use   Vaping status: Never Used  Substance and Sexual Activity   Alcohol use: Not Currently   Drug use: Never   Sexual activity: Not on file  Other Topics Concern   Not on file  Social History Narrative   Not on file   Social Drivers of Health   Financial Resource Strain: Low Risk  (04/15/2024)   Overall Financial Resource Strain (CARDIA)    Difficulty of Paying Living Expenses: Not hard at all  Food Insecurity: No Food Insecurity (04/15/2024)   Hunger Vital Sign    Worried About Running Out of Food in the Last Year: Never true    Ran Out of Food in the Last Year: Never true  Transportation Needs: No  Transportation Needs (04/15/2024)   PRAPARE - Administrator, Civil Service (Medical): No    Lack of Transportation (Non-Medical): No  Physical Activity: Inactive (04/15/2024)   Exercise Vital Sign    Days of Exercise per Week: 0 days    Minutes of Exercise per Session: Not on file  Stress: No Stress Concern Present (04/15/2024)   [employer] of Occupational Health - Occupational Stress Questionnaire    Feeling of Stress: Not at all  Social Connections: Socially Integrated (04/15/2024)   Social Connection and Isolation Panel    Frequency of Communication with Friends and Family: Twice a week    Frequency of Social Gatherings with Friends and Family: Twice a week    Attends Religious Services: More than 4 times per year    Active Member of [employer] or Organizations: Yes    Attends Banker Meetings: Never    Marital Status: Married    Tobacco Counseling Counseling given: Yes    Clinical Intake:  Pre-visit preparation completed: Yes  Pain : No/denies pain     BMI - recorded: 27.44 Nutritional Status: BMI 25 -29 Overweight Nutritional Risks: None Diabetes: Yes CBG done?: No  Lab Results  Component Value Date   HGBA1C 6.4 (H) 02/08/2024   HGBA1C 6.4 (H) 08/10/2023   HGBA1C 6.0 (H)  02/02/2023     How often do you need to have someone help you when you read instructions, pamphlets, or other written materials from your doctor or pharmacy?: 1 - Never  Interpreter Needed?: No  Information entered by :: Alia t/cma   Activities of Daily Living     04/15/2024    8:10 AM  In your present state of health, do you have any difficulty performing the following activities:  Hearing? 0  Vision? 1  Difficulty concentrating or making decisions? 0  Walking or climbing stairs? 1  Comment bad knees  Dressing or bathing? 0  Doing errands, shopping? 0  Preparing Food and eating ? N  Using the Toilet? N  In the past six months, have you accidently leaked urine? N  Do you have problems with loss of bowel control? N  Managing your Medications? N  Managing your Finances? N  Housekeeping or managing your Housekeeping? N    Patient Care Team: Dettinger, Lucio Sabin, MD as PCP - General (Family Medicine) Arnoldo Lapping, MD as PCP - Cardiology (Cardiology) Sharla Davis, MD (Internal Medicine) Homero Luster, MD (Urology) Erman Hayward, MD as Consulting Physician (Urology) Van Gelinas, MD as Consulting Physician (Neurosurgery) Hyland Mailman, MD as Referring Physician (Optometry)  I have updated your Care Teams any recent Medical Services you may have received from other providers in the past year.     Assessment:   This is a routine wellness examination for [facility].  Hearing/Vision screen Hearing Screening - Comments:: Pt denies hearing dif Vision Screening - Comments:: Pt wear glasses/pt goes to Dr. Isaac Manus in [place]/upcoming on 04/25/25   Goals Addressed             This Visit's Progress    Patient Stated       Per pt would like to slow down working       Depression Screen     04/15/2024    8:14 AM 02/08/2024   10:55 AM 08/10/2023   11:26 AM 04/13/2023    8:17 AM 02/02/2023   11:04 AM 08/03/2022   10:57 AM 04/11/2022   10:12 AM  PHQ  2/9  Scores  PHQ - 2 Score 0 0 0 0 0 0 0  PHQ- 9 Score 0    1 0     Fall Risk     04/15/2024    8:06 AM 02/08/2024   10:55 AM 08/10/2023   11:26 AM 04/13/2023    8:15 AM 02/02/2023   11:04 AM  Fall Risk   Falls in the past year? 0 0 0 0 0  Number falls in past yr: 0 0  0   Injury with Fall? 0 0  0   Risk for fall due to : No Fall Risks No Fall Risks  No Fall Risks   Follow up Falls evaluation completed Falls evaluation completed  Falls prevention discussed     MEDICARE RISK AT HOME:  Medicare Risk at Home Any stairs in or around the home?: No If so, are there any without handrails?: No Home free of loose throw rugs in walkways, pet beds, electrical cords, etc?: Yes Adequate lighting in your home to reduce risk of falls?: Yes Life alert?: No Use of a cane, walker or w/c?: No Grab bars in the bathroom?: Yes Shower chair or bench in shower?: Yes Elevated toilet seat or a handicapped toilet?: Yes  TIMED UP AND GO:  Was the test performed?  no  Cognitive Function: 6CIT completed    01/24/2018    3:20 PM  MMSE - Mini Mental State Exam  Orientation to time 5  Orientation to Place 5  Registration 3  Attention/ Calculation 5  Recall 3  Language- name 2 objects 2  Language- repeat 1  Language- follow 3 step command 3  Language- read & follow direction 1  Write a sentence 1  Copy design 1  Total score 30        04/15/2024    8:16 AM 04/13/2023    8:19 AM 04/11/2022   10:28 AM 04/07/2020    9:34 AM 04/07/2019   11:28 AM  6CIT Screen  What Year? 0 points 0 points 0 points 0 points 0 points  What month? 0 points 0 points 0 points 0 points 0 points  What time? 0 points 0 points 0 points 0 points 0 points  Count back from 20 0 points 0 points 0 points 0 points 0 points  Months in reverse 0 points 0 points 0 points 0 points 0 points  Repeat phrase 0 points 0 points 2 points 0 points 0 points  Total Score 0 points 0 points 2 points 0 points 0 points     Immunizations Immunization History  Administered Date(s) Administered   Influenza Split 08/02/2013   Influenza, High Dose Seasonal PF 07/26/2016, 11/08/2018   Influenza,inj,Quad PF,6+ Mos 08/19/2014   Influenza-Unspecified 08/08/2019   Moderna Sars-Covid-2 Vaccination 03/15/2020, 04/12/2020   Pneumococcal Conjugate-13 12/31/2013   Pneumococcal Polysaccharide-23 11/29/2016   Td 06/05/2018   Tdap 12/02/2006    Screening Tests Health Maintenance  Topic Date Due   FOOT EXAM  12/05/2019   OPHTHALMOLOGY EXAM  01/08/2023   Diabetic kidney evaluation - Urine ACR  08/04/2023   COVID-19 Vaccine (3 - Moderna risk series) 02/07/2025 (Originally 05/10/2020)   Hepatitis C Screening  02/07/2025 (Originally 10/20/1964)   Zoster Vaccines- Shingrix (1 of 2) 02/07/2025 (Originally 10[birth year]21/1965)   INFLUENZA VACCINE  05/30/2024   HEMOGLOBIN A1C  08/09/2024   Diabetic kidney evaluation - eGFR measurement  02/07/2025   Medicare Annual Wellness (AWV)  04/15/2025   DTaP/Tdap/Td (3 - Td or Tdap) 06/05/2028  Colonoscopy  10/03/2033   Pneumococcal Vaccine: 50+ Years  Completed   HPV VACCINES  Aged Out   Meningococcal B Vaccine  Aged Out    Health Maintenance  Health Maintenance Due  Topic Date Due   FOOT EXAM  12/05/2019   OPHTHALMOLOGY EXAM  01/08/2023   Diabetic kidney evaluation - Urine ACR  08/04/2023   Health Maintenance Items Addressed: See Nurse Notes at the end of this note  Additional Screening:  Vision Screening: Recommended annual ophthalmology exams for early detection of glaucoma and other disorders of the eye. Would you like a referral to an eye doctor? No    Dental Screening: Recommended annual dental exams for proper oral hygiene  Community Resource Referral / Chronic Care Management: CRR required this visit?  No   CCM required this visit?  No   Plan:    I have personally reviewed and noted the following in the patient's chart:   Medical and social history Use  of alcohol, tobacco or illicit drugs  Current medications and supplements including opioid prescriptions. Patient is not currently taking opioid prescriptions. Functional ability and status Nutritional status Physical activity Advanced directives List of other physicians Hospitalizations, surgeries, and ER visits in previous 12 months Vitals Screenings to include cognitive, depression, and falls Referrals and appointments  In addition, I have reviewed and discussed with patient certain preventive protocols, quality metrics, and best practice recommendations. A written personalized care plan for preventive services as well as general preventive health recommendations were provided to patient.   Markise Haymer, CMA   04/15/2024   After Visit Summary: (Declined) Due to this being a telephonic visit, with patients personalized plan was offered to patient but patient Declined AVS at this time   Notes: Pt is aware and is encourage to get the following at his next appt w/pcp: foot exam, UrineACR-lab and Eye exam (Diabetic)-has appt 04/25/25

## 2024-04-15 NOTE — Patient Instructions (Signed)
 Mr. Bradley Wong , Thank you for taking time out of your busy schedule to complete your Annual Wellness Visit with me. I enjoyed our conversation and look forward to speaking with you again next year. I, as well as your care team,  appreciate your ongoing commitment to your health goals. Please review the following plan we discussed and let me know if I can assist you in the future. Your Game plan/ To Do List    Follow up Visits: Next Medicare AWV with our clinical staff: 04/16/25 at 8:a.m.   Have you seen your provider in the last 6 months (3 months if uncontrolled diabetes)? Yes Next Office Visit with your provider: 08/15/24 at 10:55a.m.  Clinician Recommendations:  Aim for 30 minutes of exercise or brisk walking, 6-8 glasses of water, and 5 servings of fruits and vegetables each day. N/a      This is a list of the screening recommended for you and due dates:  Health Maintenance  Topic Date Due   Complete foot exam   12/05/2019   Eye exam for diabetics  01/08/2023   Yearly kidney health urinalysis for diabetes  08/04/2023   COVID-19 Vaccine (3 - Moderna risk series) 02/07/2025*   Hepatitis C Screening  02/07/2025*   Zoster (Shingles) Vaccine (1 of 2) 02/07/2025*   Flu Shot  05/30/2024   Hemoglobin A1C  08/09/2024   Yearly kidney function blood test for diabetes  02/07/2025   Medicare Annual Wellness Visit  04/15/2025   DTaP/Tdap/Td vaccine (3 - Td or Tdap) 06/05/2028   Colon Cancer Screening  10/03/2033   Pneumococcal Vaccine for age over 69  Completed   HPV Vaccine  Aged Out   Meningitis B Vaccine  Aged Out  *Topic was postponed. The date shown is not the original due date.    Advanced directives: (Copy Requested) Please bring a copy of your health care power of attorney and living will to the office to be added to your chart at your convenience. You can mail to [facility] [address] [place], [place] [address] or email to [email or web address] Advance Care  Planning is important because it:  [x]  Makes sure you receive the medical care that is consistent with your values, goals, and preferences  [x]  It provides guidance to your family and loved ones and reduces their decisional burden about whether or not they are making the right decisions based on your wishes.  Follow the link provided in your after visit summary or read over the paperwork we have mailed to you to help you started getting your Advance Directives in place. If you need assistance in completing these, please reach out to us  so that we can help you!  See attachments for Preventive Care and Fall Prevention Tips.

## 2024-04-16 ENCOUNTER — Encounter: Payer: Self-pay | Admitting: Family Medicine

## 2024-04-25 DIAGNOSIS — H2513 Age-related nuclear cataract, bilateral: Secondary | ICD-10-CM | POA: Diagnosis not present

## 2024-06-10 DIAGNOSIS — C73 Malignant neoplasm of thyroid gland: Secondary | ICD-10-CM | POA: Diagnosis not present

## 2024-06-10 NOTE — Progress Notes (Signed)
-------------------------------------------------------------------------------   Attestation with edits by Josette Marvis Gearing, MD at 06/11/2024 11:16 PM Attending Note I saw and evaluated the patient. Discussed with resident and agree with resident's findings and plan as documented in the resident's note.  Josette Marvis Gearing, MD   -------------------------------------------------------------------------------  Subjective:    Patient ID: Bradley Wong is a  78 y.o. male here for followup of papillary thyroid  cancer. Currently managed on Synthroid  112 mcg.  He does note a gradual 15lb weight gain over the last year. He denies any other symptoms of hyper/hypothyroidism including recent change in energy, sleep, temperature tolerance, hair, skin, bowels, weight. He denies hoarseness, dysphagia, lumps or any other changes of the anterior neck.    The following portions of the patient's history were reviewed and updated as appropriate: allergies, current medications, past family history, past medical history, past social history, past surgical history and problem list.  Review of Systems  All other systems reviewed and are negative.  Current Outpatient Medications  Medication Sig Dispense Refill  . aspirin 81 mg EC tablet Take 81 mg by mouth Once Daily.    . cholecalciferol (VITAMIN D3) 5,000 unit (125 mcg) tab tablet Take 5,000 Units by mouth Once Daily.    SABRA gabapentin (NEURONTIN) 300 mg capsule Take 300 mg by mouth 3 (three) times a day.    . HYDROcodone -acetaminophen  (NORCO) 10-325 mg per tablet Take 1 tablet by mouth every 6 (six) hours as needed (pain).    . olmesartan  (BENICAR ) 5 mg tablet Take 1 tablet by mouth daily.    . rosuvastatin  (Crestor ) 20 mg tablet Take 40 mg by mouth Once Daily.    . Synthroid  112 mcg tablet Take 1 tablet (112 mcg total) by mouth daily. 90 tablet 2  . tamsulosin  (FLOMAX ) 0.4 mg cap Take 0.4 mg by mouth.     No current facility-administered  medications for this visit.    Patient Active Problem List  Diagnosis  . Malignant neoplasm of thyroid  gland (HCC)  . Hypothyroidism, postsurgical     Objective:  Physical Exam   Vitals:   06/10/24 1127  BP: 139/84  Pulse: 64  Temp: 97.5 F (36.4 C)  SpO2: 97%     Constitutional: He is oriented to person, place, and time.  Eyes: Conjunctivae and EOM are normal. PERRLA Neck: remote thyroidectomy, no lumps or lymphadenopathy present  Cardiovascular: Normal rate, regular rhythm, normal heart sounds Pulmonary/Chest: No increased work of breathing Skin: Skin is warm and dry.   Component     Latest Ref Rng & Units 08/13/2020 08/16/2021 08/22/2022 06/08/2023  Thyroglobulin Antibody, S     <1.8 IU/mL <1.8 <1.8    Thyroglobulin, Tumor Marker, S     ng/mL <0.1 <0.1  <0.1  TSH     0.45 - 5.00 UIU/ML 1.165 0.16 (L) 0.11 (L) 1.097  THYROGLOBULIN ANTIBODY     0.0 - 0.9 IU/mL   <1.0 <1.0  THYROGLOBULIN BY IMA     1.4 - 29.2 ng/mL   <0.1 (L)     Assessment/Plan:  78 y.o. man with a past remote history of papillary thyroid  cancer with no history of recurrence.  We will check TSH today and thyroglobulin. TSH goal is lower end of normal at 0.5-1.0.    Return to clinic in 1 year.   Electronically signed by: Harlene Tinnie Public, MD 06/10/2024 10:54 AM

## 2024-06-23 ENCOUNTER — Emergency Department (HOSPITAL_BASED_OUTPATIENT_CLINIC_OR_DEPARTMENT_OTHER): Admission: EM | Admit: 2024-06-23 | Discharge: 2024-06-23 | Disposition: A | Discharge status: Home / Self Care

## 2024-06-23 ENCOUNTER — Emergency Department (HOSPITAL_BASED_OUTPATIENT_CLINIC_OR_DEPARTMENT_OTHER)

## 2024-06-23 ENCOUNTER — Other Ambulatory Visit: Payer: Self-pay

## 2024-06-23 ENCOUNTER — Encounter (HOSPITAL_BASED_OUTPATIENT_CLINIC_OR_DEPARTMENT_OTHER): Payer: Self-pay | Admitting: Emergency Medicine

## 2024-06-23 DIAGNOSIS — J9811 Atelectasis: Secondary | ICD-10-CM | POA: Diagnosis not present

## 2024-06-23 DIAGNOSIS — Z79899 Other long term (current) drug therapy: Secondary | ICD-10-CM | POA: Diagnosis not present

## 2024-06-23 DIAGNOSIS — L509 Urticaria, unspecified: Secondary | ICD-10-CM | POA: Insufficient documentation

## 2024-06-23 DIAGNOSIS — R0789 Other chest pain: Secondary | ICD-10-CM | POA: Insufficient documentation

## 2024-06-23 DIAGNOSIS — R600 Localized edema: Secondary | ICD-10-CM | POA: Insufficient documentation

## 2024-06-23 DIAGNOSIS — I10 Essential (primary) hypertension: Secondary | ICD-10-CM | POA: Diagnosis not present

## 2024-06-23 DIAGNOSIS — R0989 Other specified symptoms and signs involving the circulatory and respiratory systems: Secondary | ICD-10-CM | POA: Diagnosis not present

## 2024-06-23 DIAGNOSIS — Z7982 Long term (current) use of aspirin: Secondary | ICD-10-CM | POA: Diagnosis not present

## 2024-06-23 LAB — CBC
HCT: 46.7 % (ref 39.0–52.0)
Hemoglobin: 16.1 g/dL (ref 13.0–17.0)
MCH: 33.1 pg (ref 26.0–34.0)
MCHC: 34.5 g/dL (ref 30.0–36.0)
MCV: 95.9 fL (ref 80.0–100.0)
Platelets: 147 K/uL — ABNORMAL LOW (ref 150–400)
RBC: 4.87 MIL/uL (ref 4.22–5.81)
RDW: 13 % (ref 11.5–15.5)
WBC: 7.8 K/uL (ref 4.0–10.5)
nRBC: 0 % (ref 0.0–0.2)

## 2024-06-23 LAB — BASIC METABOLIC PANEL WITH GFR
Anion gap: 12 (ref 5–15)
BUN: 21 mg/dL (ref 8–23)
CO2: 20 mmol/L — ABNORMAL LOW (ref 22–32)
Calcium: 8.8 mg/dL — ABNORMAL LOW (ref 8.9–10.3)
Chloride: 108 mmol/L (ref 98–111)
Creatinine, Ser: 1.11 mg/dL (ref 0.61–1.24)
GFR, Estimated: >60 mL/min (ref >60)
Glucose, Bld: 157 mg/dL — ABNORMAL HIGH (ref 70–99)
Potassium: 4.4 mmol/L (ref 3.5–5.1)
Sodium: 139 mmol/L (ref 135–145)

## 2024-06-23 LAB — TROPONIN T, HIGH SENSITIVITY
Troponin T High Sensitivity: <15 ng/L (ref 0–19)
Troponin T High Sensitivity: <15 ng/L (ref 0–19)

## 2024-06-23 MED ORDER — EPINEPHRINE 0.3 MG/0.3ML IJ SOAJ: 0.3000 mg | INTRAMUSCULAR | 0 refills | Status: AC | PRN

## 2024-06-23 MED ORDER — DIPHENHYDRAMINE HCL 50 MG/ML IJ SOLN
25.0000 mg | Freq: Once | INTRAMUSCULAR | Status: AC
  Administered 2024-06-23: 25 mg via INTRAVENOUS
  Filled 2024-06-23: qty 1

## 2024-06-23 MED ORDER — FAMOTIDINE 20 MG PO TABS
20.0000 mg | ORAL_TABLET | Freq: Two times a day (BID) | ORAL | 0 refills | Status: AC
Start: 2024-06-23 — End: ?

## 2024-06-23 MED ORDER — LORATADINE 10 MG PO TABS: 10.0000 mg | ORAL_TABLET | Freq: Every day | ORAL | 0 refills | Status: AC

## 2024-06-23 MED ORDER — METHYLPREDNISOLONE SODIUM SUCC 125 MG IJ SOLR
125.0000 mg | Freq: Once | INTRAMUSCULAR | Status: AC
  Administered 2024-06-23: 125 mg via INTRAVENOUS
  Filled 2024-06-23: qty 2

## 2024-06-23 MED ORDER — FAMOTIDINE 20 MG PO TABS
20.0000 mg | ORAL_TABLET | Freq: Once | ORAL | Status: AC
  Administered 2024-06-23: 20 mg via ORAL
  Filled 2024-06-23: qty 1

## 2024-06-23 MED ORDER — PREDNISONE 20 MG PO TABS: 40.0000 mg | ORAL_TABLET | Freq: Every day | ORAL | 0 refills | Status: DC

## 2024-06-23 NOTE — ED Triage Notes (Signed)
 Hives to bilateral arms since yesterday.  Today he started having chest tightness with sob.  No known allergen.  No acute respiratory distress noted.  Pt states he had cheerios with blueberries yesterday.  Noted redness and swelling to hands.  Pt has taken benadryl  without much relief.

## 2024-06-23 NOTE — Discharge Instructions (Addendum)
 Please read and follow all provided instructions.  Your diagnoses today include:  1. Urticaria   2. Feeling of chest tightness     Tests performed today include: Vital signs. See below for your results today.  Complete blood cell count: No concerning findings Basic metabolic panel: Blood sugar was a bit high Cardiac enzymes (blood test looking for stress on the heart): Were both normal EKG: No concerning findings Chest x-ray: No concerning findings  Medications prescribed:  Benadryl  (diphenhydramine ) - antihistamine  You can find this medication over-the-counter.   DO NOT exceed:  50mg  Benadryl  every 6 hours    Benadryl  will make you drowsy. DO NOT drive or perform any activities that require you to be awake and alert if taking this.  Pepcid  (famotidine ) - antihistamine  You can find this medication over-the-counter.   DO NOT exceed:  20mg  Pepcid  every 12 hours  Epi-pen Inject into thigh as directed if you have a severe reaction that causes throat swelling or any trouble breathing. Call 9-1-1 immediately if you use an Epi-pen. You should be evaluated at a hospital as soon as possible.    Take any prescribed medications only as directed.  Home care instructions:  Follow any educational materials contained in this packet Take loratadine  in the morning and diphenhydramine  (Benadryl ) at bedtime  Follow-up instructions: Please follow-up with your primary care provider as needed for further evaluation of your symptoms.   Return instructions:  Please return to the Emergency Department if you experience worsening symptoms.  Call 9-1-1 immediately if you have an allergic reaction that involves your lips, mouth, throat or if you have any difficulty breathing. This is a life-threatening emergency.  Return if you have any worsening chest pain especially if it radiates to the neck, jaw or arm, is associated with vomiting, exertion, or heavy sweating Please return if you have any  other emergent concerns.  Additional Information:  Your vital signs today were: BP 118/73   Pulse 69   Temp 97.8 F (36.6 C)   Resp 16   Ht 6' 1 (1.854 m)   Wt 96.2 kg   SpO2 97%   BMI 27.97 kg/m  If your blood pressure (BP) was elevated above 135/85 this visit, please have this repeated by your doctor within one month. --------------

## 2024-06-23 NOTE — ED Provider Notes (Signed)
 [place] EMERGENCY DEPARTMENT AT [facility] [place] Provider Note   CSN: [id number] Arrival date & time: 06/23/24  1042     Patient presents with: Allergic Reaction   Bradley Wong is a 78 y.o. male.   Patient with history of prediabetes on medication, hypertension, high cholesterol (per records patient is on rosuvastatin  and olmesartan ) --presents to the emergency department for evaluation of hives and chest tightness.  Patient developed itchy splotchy spots over his extremities and torso yesterday at around 2 PM.  He had Cheerios, blueberries, and coffee prior to this, has had these foods in the past without any reactions.  No new skin exposures, new medications, new environments.  He was not working outdoors.  He has never had hives like this before.  He felt swelling in his body as well.  Reports some mild lip swelling but no difficulty breathing or swallowing.  He had swelling in his arms upon waking this morning as well as his ankles.  He took Benadryl  last night.  Hives are currently improved, mild.  However with these symptoms, patient has had chest tightness across the chest.  No shortness of breath.  Chest tightness now resolved, but present just prior to arrival to the ED today.  No diaphoresis or vomiting.  No diarrhea.  No lightheadedness or syncope.  Denies any tick bites or fevers.       Prior to Admission medications   Medication Sig Start Date End Date Taking? Authorizing Provider  aspirin EC 81 MG tablet Take 81 mg by mouth daily. Swallow whole.    [provider]  Cholecalciferol (VITAMIN D -3) 5000 UNITS TABS Take 5,000 Units by mouth daily.     [provider]  HYDROcodone -acetaminophen  (NORCO) 10-325 MG tablet Take 0.5 tablets by mouth daily as needed for severe pain. Take 1/2 tablets prn 08/03/22   Dettinger, Fonda LABOR, MD  olmesartan  (BENICAR ) 5 MG tablet Take 1 tablet (5 mg total) by mouth daily. 02/08/24   Dettinger, Fonda LABOR, MD   rosuvastatin  (CRESTOR ) 40 MG tablet Take 0.5 tablets (20 mg total) by mouth daily. 02/08/24   Dettinger, Fonda LABOR, MD  SYNTHROID  112 MCG tablet Take 112 mcg by mouth daily.    [provider]  tamsulosin  (FLOMAX ) 0.4 MG CAPS capsule Take 1 capsule (0.4 mg total) by mouth daily. TAKE (2) CAPSULE DAILY 02/08/24   Dettinger, Fonda LABOR, MD    Allergies: Penicillins    Review of Systems  Updated Vital Signs BP 120/80 (BP Location: Right Arm)   Pulse 83   Temp 97.7 F (36.5 C) (Oral)   Resp 18   Ht 6' 1 (1.854 m)   Wt 96.2 kg   SpO2 97%   BMI 27.97 kg/m   Physical Exam Vitals and nursing note reviewed.  Constitutional:      Appearance: He is well-developed. He is not diaphoretic.  HENT:     Head: Normocephalic and atraumatic.     Nose: Nose normal.     Mouth/Throat:     Mouth: Mucous membranes are not dry.     Comments: No signs of angioedema or posterior oropharyngeal edema. Eyes:     Conjunctiva/sclera: Conjunctivae normal.  Neck:     Vascular: Normal carotid pulses. No carotid bruit or JVD.     Trachea: Trachea normal. No tracheal deviation.  Cardiovascular:     Rate and Rhythm: Normal rate and regular rhythm.     Pulses: No decreased pulses.  Radial pulses are 2+ on the right side and 2+ on the left side.     Heart sounds: Normal heart sounds, S1 normal and S2 normal. Heart sounds not distant. No murmur heard. Pulmonary:     Effort: Pulmonary effort is normal. No respiratory distress.     Breath sounds: Normal breath sounds. No wheezing.  Chest:     Chest wall: No tenderness.  Abdominal:     General: Bowel sounds are normal.     Palpations: Abdomen is soft.     Tenderness: There is no abdominal tenderness. There is no guarding or rebound.  Musculoskeletal:     Cervical back: Normal range of motion and neck supple. No muscular tenderness.     Right lower leg: No edema.     Left lower leg: No edema.     Comments: Mild bilateral edema of hands.  Mild  erythema.  Skin:    General: Skin is warm and dry.     Coloration: Skin is not pale.     Comments: Patient with some mild scattered urticaria on the forearms, no current urticaria noted on the torso.  Neurological:     Mental Status: He is alert. Mental status is at baseline.  Psychiatric:        Mood and Affect: Mood normal.     (all labs ordered are listed, but only abnormal results are displayed) Labs Reviewed  BASIC METABOLIC PANEL WITH GFR - Abnormal; Notable for the following components:      Result Value   CO2 20 (*)    Glucose, Bld 157 (*)    Calcium  8.8 (*)    All other components within normal limits  CBC - Abnormal; Notable for the following components:   Platelets 147 (*)    All other components within normal limits  TROPONIN T, HIGH SENSITIVITY  TROPONIN T, HIGH SENSITIVITY    ED ECG REPORT   Date: 06/23/2024  Rate: 83  Rhythm: normal sinus rhythm  QRS Axis: normal  Intervals: normal  ST/T Wave abnormalities: normal  Conduction Disutrbances:none  Narrative Interpretation:   Old EKG Reviewed: unchanged from 10/2023  I have personally reviewed the EKG tracing and agree with the computerized printout as noted.   Radiology: DG Chest 2 View Result Date: 06/23/2024 CLINICAL DATA:  Chest tightness EXAM: CHEST - 2 VIEW COMPARISON:  12/05/2017 FINDINGS: Low lung volumes with bibasilar atelectasis. Heart mediastinal contours within normal limits. No effusions or acute bony abnormality. IMPRESSION: Low lung volumes, bibasilar atelectasis. Electronically Signed   By: Franky Crease M.D.   On: 06/23/2024 12:47     Procedures   Medications Ordered in the ED  methylPREDNISolone  sodium succinate (SOLU-MEDROL ) 125 mg/2 mL injection 125 mg (125 mg Intravenous Given 06/23/24 1113)  diphenhydrAMINE  (BENADRYL ) injection 25 mg (25 mg Intravenous Given 06/23/24 1112)  famotidine  (PEPCID ) tablet 20 mg (20 mg Oral Given 06/23/24 1115)   ED Course  Patient seen and examined.  History obtained directly from patient.   Labs/EKG: Ordered CBC, BMP, lipase, UA.  Personally reviewed and interpreted EKG as above.  Imaging: Ordered chest x-ray.  Medications/Fluids: Ordered: IV Solu-Medrol , IV Benadryl , p.o. Pepcid .   Most recent vital signs reviewed and are as follows: BP 120/80 (BP Location: Right Arm)   Pulse 83   Temp 97.7 F (36.5 C) (Oral)   Resp 18   Ht 6' 1 (1.854 m)   Wt 96.2 kg   SpO2 97%   BMI 27.97 kg/m   Initial impression:  Urticaria, however patient also reports chest tightness.  Given risk factors, will need cardiac evaluation.  It is possible that his tightness sensation is related to mild allergic reaction.  At this time it does not appear to be anaphylaxis.  Will treat allergy as well, reassess.  Reassuring EKG without any ischemic changes.  12:25 PM Reassessment performed.  Resting comfortably.  Patient appears in no distress.  Labs personally reviewed and interpreted including: CBC with minimally elevated platelets otherwise unremarkable; glucose 157 with normal anion gap, otherwise unremarkable; first troponin negative.  Will check second troponin.  Imaging personally visualized and interpreted including: Chest x-ray, no obvious infiltrate, some blunting of costophrenic angle on the anterior view.  Reviewed pertinent lab work and imaging with patient at bedside. Questions answered.   Most current vital signs reviewed and are as follows: BP 120/80 (BP Location: Right Arm)   Pulse 83   Temp 97.7 F (36.5 C) (Oral)   Resp 18   Ht 6' 1 (1.854 m)   Wt 96.2 kg   SpO2 97%   BMI 27.97 kg/m   Plan: Awaiting second troponin.  From an allergic standpoint, patient is doing well.  No recurrent chest symptoms.  Workup thus far is very reassuring.  If stable and second troponin is negative, anticipate discharge to home with prednisone , antihistamine blockade.  2:48 PM Reassessment performed. Patient appears stable.  Urticaria resolved.  He still  has some mild edema of his hands bilaterally.  Labs personally reviewed and interpreted including: Troponin remains negative  Imaging personally visualized and interpreted including: No infiltrates on chest x-ray.  Reviewed pertinent lab work and imaging with patient at bedside. Questions answered.   Most current vital signs reviewed and are as follows: BP 118/73   Pulse 69   Temp 97.8 F (36.6 C)   Resp 16   Ht 6' 1 (1.854 m)   Wt 96.2 kg   SpO2 97%   BMI 27.97 kg/m   Plan: Discharge to home.   Prescriptions written for: Loratadine , Pepcid , prednisone , EpiPen   Other home care instructions discussed: Histamine blockade with loratadine  in the morning, diphenhydramine  at bedtime.  Also Pepcid  during the day and prednisone .  EpiPen  for severe emergency, call 911 if used.  ED return instructions discussed: New or worsening symptoms  Follow-up instructions discussed: Patient encouraged to follow-up with their PCP as needed.                                    Medical Decision Making Amount and/or Complexity of Data Reviewed Labs: ordered. Radiology: ordered.  Risk OTC drugs. Prescription drug management.   Patient with generalized urticaria, less concern for anaphylaxis at this time.  He did have some chest symptoms and for this reason cardiac workup was undertaken.  Reassuring EKG, chest x-ray and troponins.  Symptoms very atypical for ACS or PE.  Symptoms remain resolved in the emergency department.  Suspect that this is more related to allergy at this time.  Lab workup is reassuring.  Patient looks well.  Will continue histamine blockade, prednisone  as above.  The patient's vital signs, pertinent lab work and imaging were reviewed and interpreted as discussed in the ED course. Hospitalization was considered for further testing, treatments, or serial exams/observation. However as patient is well-appearing, has a stable exam, and reassuring studies today, I do not feel that  they warrant admission at this time. This plan was discussed  with the patient who verbalizes agreement and comfort with this plan and seems reliable and able to return to the Emergency Department with worsening or changing symptoms.       Final diagnoses:  Urticaria  Feeling of chest tightness    ED Discharge Orders          Ordered    predniSONE  (DELTASONE ) 20 MG tablet  Daily        06/23/24 1445    loratadine  (CLARITIN ) 10 MG tablet  Daily        06/23/24 1445    famotidine  (PEPCID ) 20 MG tablet  2 times daily        06/23/24 1445    EPINEPHrine  0.3 mg/0.3 mL IJ SOAJ injection  As needed        06/23/24 1445               Desiderio Chew, PA-C 06/23/24 1450    Neysa Caron PARAS, DO 06/23/24 1511

## 2024-07-01 ENCOUNTER — Other Ambulatory Visit: Payer: Self-pay | Admitting: Family Medicine

## 2024-07-01 DIAGNOSIS — I10 Essential (primary) hypertension: Secondary | ICD-10-CM

## 2024-07-01 DIAGNOSIS — E118 Type 2 diabetes mellitus with unspecified complications: Secondary | ICD-10-CM

## 2024-08-15 ENCOUNTER — Ambulatory Visit: Admitting: Family Medicine

## 2024-08-15 ENCOUNTER — Encounter: Payer: Self-pay | Admitting: Family Medicine

## 2024-08-15 VITALS — BP 138/85 | HR 60 | Ht 73.0 in | Wt 213.0 lb

## 2024-08-15 DIAGNOSIS — E7800 Pure hypercholesterolemia, unspecified: Secondary | ICD-10-CM

## 2024-08-15 DIAGNOSIS — E118 Type 2 diabetes mellitus with unspecified complications: Secondary | ICD-10-CM

## 2024-08-15 DIAGNOSIS — Z125 Encounter for screening for malignant neoplasm of prostate: Secondary | ICD-10-CM

## 2024-08-15 DIAGNOSIS — R351 Nocturia: Secondary | ICD-10-CM | POA: Diagnosis not present

## 2024-08-15 DIAGNOSIS — E890 Postprocedural hypothyroidism: Secondary | ICD-10-CM | POA: Diagnosis not present

## 2024-08-15 DIAGNOSIS — R7303 Prediabetes: Secondary | ICD-10-CM

## 2024-08-15 DIAGNOSIS — E785 Hyperlipidemia, unspecified: Secondary | ICD-10-CM

## 2024-08-15 DIAGNOSIS — I10 Essential (primary) hypertension: Secondary | ICD-10-CM

## 2024-08-15 DIAGNOSIS — E1169 Type 2 diabetes mellitus with other specified complication: Secondary | ICD-10-CM | POA: Diagnosis not present

## 2024-08-15 LAB — BAYER DCA HB A1C WAIVED: HB A1C (BAYER DCA - WAIVED): 6.5 % — ABNORMAL HIGH (ref 4.8–5.6)

## 2024-08-15 MED ORDER — HYDROCODONE-ACETAMINOPHEN 10-325 MG PO TABS: 0.5000 | ORAL_TABLET | Freq: Every day | ORAL | 0 refills | Status: AC | PRN

## 2024-08-15 MED ORDER — OLMESARTAN MEDOXOMIL 5 MG PO TABS: 5.0000 mg | ORAL_TABLET | Freq: Every day | ORAL | 3 refills | Status: AC

## 2024-08-15 NOTE — Progress Notes (Signed)
 BP 138/85   Pulse 60   Ht 6' 1 (1.854 m)   Wt 213 lb (96.6 kg)   SpO2 97%   BMI 28.10 kg/m    Subjective:   Patient ID: Bradley Wong, male    DOB: [birth year], 78 y.o.   MRN: [id number]  HPI: Bradley Wong is a 78 y.o. male presenting on 08/15/2024 for Medical Management of Chronic Issues   Discussed the use of AI scribe software for clinical note transcription with the patient, who gave verbal consent to proceed.  History of Present Illness   Bradley Wong is a 78 year old male who presents for a recheck and medication refill.  Arthritis-related back pain - Uses Norco infrequently for pain, primarily after overexertion - Takes a whole tablet as needed  Hypertension - Takes olmesartan  daily for blood pressure control  Thyroid  function - Thyroid  function was normal on testing last month - Continues to take 112 mcg of thyroid  medication daily  Hyperlipidemia - Takes Crestor  for cholesterol management - Cholesterol levels to be rechecked today  Lower urinary tract symptoms - Takes Flomax  for prostate-related urinary symptoms - Urinary stream is generally stable but varies with dietary intake, particularly caffeine - Increased sensitivity to caffeine, especially after consuming chocolate ice cream  Impaired glucose tolerance - Monitored for borderline diabetes - Manages blood sugar primarily through dietary modifications - No current symptoms or problems with blood sugar levels - Actively monitoring diet to manage weight          Relevant past medical, surgical, family and social history reviewed and updated as indicated. Interim medical history since our last visit reviewed. Allergies and medications reviewed and updated.  Review of Systems  Constitutional:  Negative for chills and fever.  Respiratory:  Negative for shortness of breath and wheezing.   Cardiovascular:  Negative for chest pain and leg swelling.  Musculoskeletal:  Positive for  arthralgias and back pain. Negative for gait problem.  Skin:  Negative for rash.  All other systems reviewed and are negative.   Per HPI unless specifically indicated above   Allergies as of 08/15/2024       Reactions   Penicillins Rash   Has patient had a PCN reaction causing immediate rash, facial/tongue/throat swelling, SOB or lightheadedness with hypotension: No Has patient had a PCN reaction causing severe rash involving mucus membranes or skin necrosis: No Has patient had a PCN reaction that required hospitalization: Unknown Has patient had a PCN reaction occurring within the last 10 years: No If all of the above answers are NO, then may proceed with Cephalosporin use.        Medication List        Accurate as of August 15, 2024 11:15 AM. If you have any questions, ask your nurse or doctor.          STOP taking these medications    predniSONE  20 MG tablet Commonly known as: DELTASONE  Stopped by: Fonda LABOR Kadesia Robel       TAKE these medications    aspirin EC 81 MG tablet Take 81 mg by mouth daily. Swallow whole.   EPINEPHrine  0.3 mg/0.3 mL Soaj injection Commonly known as: EPI-PEN Inject 0.3 mg into the muscle as needed for anaphylaxis.   famotidine  20 MG tablet Commonly known as: PEPCID  Take 1 tablet (20 mg total) by mouth 2 (two) times daily.   HYDROcodone -acetaminophen  10-325 MG tablet Commonly known as: NORCO Take 0.5 tablets by mouth daily as needed for  severe pain (pain score 7-10). Take 1/2 tablets prn   loratadine  10 MG tablet Commonly known as: CLARITIN  Take 1 tablet (10 mg total) by mouth daily.   olmesartan  5 MG tablet Commonly known as: BENICAR  Take 1 tablet (5 mg total) by mouth daily.   rosuvastatin  40 MG tablet Commonly known as: CRESTOR  Take 0.5 tablets (20 mg total) by mouth daily.   Synthroid  112 MCG tablet Generic drug: levothyroxine  Take 112 mcg by mouth daily.   tamsulosin  0.4 MG Caps capsule Commonly known as:  FLOMAX  Take 1 capsule (0.4 mg total) by mouth daily. TAKE (2) CAPSULE DAILY   Vitamin D -3 125 MCG (5000 UT) Tabs Take 5,000 Units by mouth daily.         Objective:   BP 138/85   Pulse 60   Ht 6' 1 (1.854 m)   Wt 213 lb (96.6 kg)   SpO2 97%   BMI 28.10 kg/m   Wt Readings from Last 3 Encounters:  08/15/24 213 lb (96.6 kg)  06/23/24 212 lb (96.2 kg)  04/15/24 208 lb (94.3 kg)    Physical Exam Vitals and nursing note reviewed.    Physical Exam   VITALS: BP- 138/84 NECK: Thyroid  normal, no lumps. CHEST: Lungs clear to auscultation. CARDIOVASCULAR: Regular rate and rhythm, no murmurs.         Assessment & Plan:   Problem List Items Addressed This Visit       Cardiovascular and Mediastinum   Essential hypertension, benign (Chronic)   Relevant Medications   olmesartan  (BENICAR ) 5 MG tablet   Other Relevant Orders   Bayer DCA Hb A1c Waived   CBC with Differential/Platelet   CMP14+EGFR   Lipid panel     Endocrine   Hyperlipidemia associated with type 2 diabetes mellitus (HCC)   Relevant Medications   olmesartan  (BENICAR ) 5 MG tablet   Hypothyroidism, postsurgical   Relevant Orders   Bayer DCA Hb A1c Waived   CBC with Differential/Platelet   CMP14+EGFR   Lipid panel   TSH     Other   Pre-diabetes   Other Visit Diagnoses       Controlled type 2 diabetes mellitus with complication, without long-term current use of insulin (HCC)    -  Primary   Relevant Medications   olmesartan  (BENICAR ) 5 MG tablet   Other Relevant Orders   Bayer DCA Hb A1c Waived   CBC with Differential/Platelet   CMP14+EGFR   Lipid panel     Pure hypercholesterolemia       Relevant Medications   olmesartan  (BENICAR ) 5 MG tablet   Other Relevant Orders   Bayer DCA Hb A1c Waived   CBC with Differential/Platelet   CMP14+EGFR   Lipid panel     Prostate cancer screening         Nocturia       Relevant Orders   PSA, total and free          Benign prostatic hyperplasia  with lower urinary tract symptoms Symptoms managed with Flomax . Urinary stream affected by dietary intake, especially caffeine. - Continue Flomax . - Monitor dietary intake, especially caffeine.  Chronic back pain due to osteoarthritis Intermittent Norco use for pain, particularly with overexertion. - Refill Norco prescription. - Use Norco as needed for back pain.  Essential hypertension Blood pressure controlled with olmesartan . - Continue olmesartan  daily.  Hypercholesterolemia Taking Crestor . Cholesterol levels to be rechecked today. - Recheck cholesterol levels.  Prediabetes Borderline diabetes. No glucose issues. Emphasized foot care  to prevent complications. - Recheck blood glucose levels today. - Monitor diet. - Regular foot care.  Postprocedural hypothyroidism Thyroid  function normal. Continues on 112 mcg thyroid  medication. - Continue 112 mcg thyroid  medication daily.      Follow up plan: Return in about 6 months (around 02/13/2025), or if symptoms worsen or fail to improve, for Prediabetes and thyroid  and cholesterol..  Counseling provided for all of the vaccine components Orders Placed This Encounter  Procedures   Bayer DCA Hb A1c Waived   CBC with Differential/Platelet   CMP14+EGFR   Lipid panel   PSA, total and free   TSH   Fonda Levins, MD [facility] 08/15/2024, 11:15 AM

## 2024-08-16 LAB — CBC WITH DIFFERENTIAL/PLATELET
Basophils Absolute: 0 x10E3/uL (ref 0.0–0.2)
Basos: 0 %
EOS (ABSOLUTE): 0.1 x10E3/uL (ref 0.0–0.4)
Eos: 2 %
Hematocrit: 44.6 % (ref 37.5–51.0)
Hemoglobin: 14.7 g/dL (ref 13.0–17.7)
Immature Grans (Abs): 0 x10E3/uL (ref 0.0–0.1)
Immature Granulocytes: 0 %
Lymphocytes Absolute: 1.1 x10E3/uL (ref 0.7–3.1)
Lymphs: 21 %
MCH: 33.3 pg — ABNORMAL HIGH (ref 26.6–33.0)
MCHC: 33 g/dL (ref 31.5–35.7)
MCV: 101 fL — ABNORMAL HIGH (ref 79–97)
Monocytes Absolute: 0.4 x10E3/uL (ref 0.1–0.9)
Monocytes: 8 %
Neutrophils Absolute: 3.7 x10E3/uL (ref 1.4–7.0)
Neutrophils: 69 %
Platelets: 151 x10E3/uL (ref 150–450)
RBC: 4.42 x10E6/uL (ref 4.14–5.80)
RDW: 13 % (ref 11.6–15.4)
WBC: 5.3 x10E3/uL (ref 3.4–10.8)

## 2024-08-16 LAB — CMP14+EGFR
ALT: 23 IU/L (ref 0–44)
AST: 20 IU/L (ref 0–40)
Albumin: 4.3 g/dL (ref 3.8–4.8)
Alkaline Phosphatase: 56 IU/L (ref 47–123)
BUN/Creatinine Ratio: 17 (ref 10–24)
BUN: 21 mg/dL (ref 8–27)
Bilirubin Total: 0.7 mg/dL (ref 0.0–1.2)
CO2: 21 mmol/L (ref 20–29)
Calcium: 9.4 mg/dL (ref 8.6–10.2)
Chloride: 108 mmol/L — ABNORMAL HIGH (ref 96–106)
Creatinine, Ser: 1.22 mg/dL (ref 0.76–1.27)
Globulin, Total: 1.7 g/dL (ref 1.5–4.5)
Glucose: 102 mg/dL — ABNORMAL HIGH (ref 70–99)
Potassium: 4.9 mmol/L (ref 3.5–5.2)
Sodium: 143 mmol/L (ref 134–144)
Total Protein: 6 g/dL (ref 6.0–8.5)
eGFR: 62 mL/min/1.73 (ref >59)

## 2024-08-16 LAB — LIPID PANEL
Chol/HDL Ratio: 2.7 ratio (ref 0.0–5.0)
Cholesterol, Total: 104 mg/dL (ref 100–199)
HDL: 38 mg/dL — ABNORMAL LOW (ref >39)
LDL Chol Calc (NIH): 41 mg/dL (ref 0–99)
Triglycerides: 143 mg/dL (ref 0–149)
VLDL Cholesterol Cal: 25 mg/dL (ref 5–40)

## 2024-08-16 LAB — PSA, TOTAL AND FREE
PSA, Free Pct: 22.6 %
PSA, Free: 1.13 ng/mL
Prostate Specific Ag, Serum: 5 ng/mL — AB (ref 0.0–4.0)

## 2024-08-20 ENCOUNTER — Other Ambulatory Visit: Payer: Self-pay

## 2024-08-20 DIAGNOSIS — Z79899 Other long term (current) drug therapy: Secondary | ICD-10-CM

## 2024-08-22 ENCOUNTER — Ambulatory Visit: Payer: Self-pay | Admitting: Family Medicine

## 2024-08-24 LAB — TOXASSURE SELECT 13 (MW), URINE

## 2024-08-25 ENCOUNTER — Ambulatory Visit: Payer: Self-pay | Admitting: Family Medicine

## 2024-11-06 ENCOUNTER — Encounter: Payer: Self-pay | Admitting: Cardiovascular Disease

## 2024-12-04 ENCOUNTER — Telehealth: Payer: Self-pay

## 2024-12-04 NOTE — Telephone Encounter (Signed)
 Per Dr. Wonda, the patient is due for 1 year follow-up. Scheduled him 02/17/2025.

## 2025-02-13 ENCOUNTER — Ambulatory Visit: Payer: Self-pay | Admitting: Family Medicine

## 2025-02-17 ENCOUNTER — Ambulatory Visit: Admitting: Cardiovascular Disease

## 2025-04-16 ENCOUNTER — Ambulatory Visit: Payer: Self-pay

## 2025-04-17 ENCOUNTER — Ambulatory Visit
